# Patient Record
Sex: Male | Born: 1970 | Race: White | Hispanic: No | Marital: Single | Smoking: Former smoker
Health system: Southern US, Community
[De-identification: ages and names within clinical notes are randomized; demographics above are authoritative.]

## PROBLEM LIST (undated history)

## (undated) DIAGNOSIS — A4902 Methicillin resistant Staphylococcus aureus infection, unspecified site: Secondary | ICD-10-CM

## (undated) DIAGNOSIS — M479 Spondylosis, unspecified: Secondary | ICD-10-CM

## (undated) DIAGNOSIS — R945 Abnormal results of liver function studies: Secondary | ICD-10-CM

## (undated) DIAGNOSIS — T148XXA Other injury of unspecified body region, initial encounter: Secondary | ICD-10-CM

## (undated) DIAGNOSIS — M549 Dorsalgia, unspecified: Secondary | ICD-10-CM

## (undated) DIAGNOSIS — I10 Essential (primary) hypertension: Secondary | ICD-10-CM

## (undated) DIAGNOSIS — S2239XA Fracture of one rib, unspecified side, initial encounter for closed fracture: Secondary | ICD-10-CM

## (undated) DIAGNOSIS — R0600 Dyspnea, unspecified: Secondary | ICD-10-CM

## (undated) HISTORY — PX: WRIST SURGERY: SHX841

## (undated) HISTORY — DX: Essential (primary) hypertension: I10

## (undated) HISTORY — PX: OTHER SURGICAL HISTORY: SHX169

## (undated) HISTORY — PX: HEMORROIDECTOMY: SUR656

## (undated) HISTORY — PX: KNEE ARTHROSCOPY: SUR90

---

## 2000-10-21 ENCOUNTER — Emergency Department (HOSPITAL_COMMUNITY): Admission: EM | Admit: 2000-10-21 | Discharge: 2000-10-21 | Payer: Self-pay | Admitting: Emergency Medicine

## 2001-01-01 ENCOUNTER — Inpatient Hospital Stay (HOSPITAL_COMMUNITY): Admission: EM | Admit: 2001-01-01 | Discharge: 2001-01-02 | Payer: Self-pay | Admitting: General Surgery

## 2001-01-01 ENCOUNTER — Encounter: Payer: Self-pay | Admitting: General Surgery

## 2003-01-25 ENCOUNTER — Ambulatory Visit (HOSPITAL_COMMUNITY): Admission: RE | Admit: 2003-01-25 | Discharge: 2003-01-25 | Payer: Self-pay | Admitting: Internal Medicine

## 2003-05-14 ENCOUNTER — Emergency Department (HOSPITAL_COMMUNITY): Admission: EM | Admit: 2003-05-14 | Discharge: 2003-05-14 | Payer: Self-pay | Admitting: Emergency Medicine

## 2003-05-17 ENCOUNTER — Inpatient Hospital Stay (HOSPITAL_COMMUNITY): Admission: EM | Admit: 2003-05-17 | Discharge: 2003-05-22 | Payer: Self-pay | Admitting: Emergency Medicine

## 2003-05-29 ENCOUNTER — Emergency Department (HOSPITAL_COMMUNITY): Admission: EM | Admit: 2003-05-29 | Discharge: 2003-05-29 | Payer: Self-pay | Admitting: Emergency Medicine

## 2003-06-14 ENCOUNTER — Inpatient Hospital Stay (HOSPITAL_COMMUNITY): Admission: EM | Admit: 2003-06-14 | Discharge: 2003-06-16 | Payer: Self-pay | Admitting: *Deleted

## 2003-12-26 ENCOUNTER — Emergency Department (HOSPITAL_COMMUNITY): Admission: EM | Admit: 2003-12-26 | Discharge: 2003-12-27 | Payer: Self-pay | Admitting: Emergency Medicine

## 2003-12-28 ENCOUNTER — Emergency Department (HOSPITAL_COMMUNITY): Admission: EM | Admit: 2003-12-28 | Discharge: 2003-12-29 | Payer: Self-pay | Admitting: *Deleted

## 2003-12-31 ENCOUNTER — Emergency Department (HOSPITAL_COMMUNITY): Admission: EM | Admit: 2003-12-31 | Discharge: 2003-12-31 | Payer: Self-pay | Admitting: Emergency Medicine

## 2004-01-02 ENCOUNTER — Emergency Department (HOSPITAL_COMMUNITY): Admission: EM | Admit: 2004-01-02 | Discharge: 2004-01-02 | Payer: Self-pay | Admitting: *Deleted

## 2004-04-06 ENCOUNTER — Emergency Department (HOSPITAL_COMMUNITY): Admission: EM | Admit: 2004-04-06 | Discharge: 2004-04-06 | Payer: Self-pay | Admitting: Emergency Medicine

## 2004-08-10 ENCOUNTER — Emergency Department (HOSPITAL_COMMUNITY): Admission: EM | Admit: 2004-08-10 | Discharge: 2004-08-11 | Payer: Self-pay | Admitting: *Deleted

## 2004-08-12 ENCOUNTER — Emergency Department (HOSPITAL_COMMUNITY): Admission: EM | Admit: 2004-08-12 | Discharge: 2004-08-12 | Payer: Self-pay | Admitting: Emergency Medicine

## 2004-08-14 ENCOUNTER — Emergency Department (HOSPITAL_COMMUNITY): Admission: EM | Admit: 2004-08-14 | Discharge: 2004-08-14 | Payer: Self-pay | Admitting: Emergency Medicine

## 2004-09-26 ENCOUNTER — Emergency Department (HOSPITAL_COMMUNITY): Admission: EM | Admit: 2004-09-26 | Discharge: 2004-09-26 | Payer: Self-pay | Admitting: Emergency Medicine

## 2004-09-28 ENCOUNTER — Emergency Department (HOSPITAL_COMMUNITY): Admission: EM | Admit: 2004-09-28 | Discharge: 2004-09-28 | Payer: Self-pay | Admitting: *Deleted

## 2004-09-30 ENCOUNTER — Emergency Department (HOSPITAL_COMMUNITY): Admission: EM | Admit: 2004-09-30 | Discharge: 2004-09-30 | Payer: Self-pay | Admitting: Emergency Medicine

## 2005-06-06 ENCOUNTER — Emergency Department (HOSPITAL_COMMUNITY): Admission: EM | Admit: 2005-06-06 | Discharge: 2005-06-07 | Payer: Self-pay | Admitting: Emergency Medicine

## 2005-06-08 ENCOUNTER — Emergency Department (HOSPITAL_COMMUNITY): Admission: EM | Admit: 2005-06-08 | Discharge: 2005-06-09 | Payer: Self-pay | Admitting: Emergency Medicine

## 2005-08-05 ENCOUNTER — Emergency Department (HOSPITAL_COMMUNITY): Admission: EM | Admit: 2005-08-05 | Discharge: 2005-08-05 | Payer: Self-pay | Admitting: Emergency Medicine

## 2005-08-07 ENCOUNTER — Emergency Department (HOSPITAL_COMMUNITY): Admission: EM | Admit: 2005-08-07 | Discharge: 2005-08-07 | Payer: Self-pay | Admitting: Emergency Medicine

## 2007-06-24 ENCOUNTER — Emergency Department (HOSPITAL_COMMUNITY): Admission: EM | Admit: 2007-06-24 | Discharge: 2007-06-24 | Payer: Self-pay | Admitting: Emergency Medicine

## 2009-08-08 ENCOUNTER — Emergency Department (HOSPITAL_COMMUNITY): Admission: EM | Admit: 2009-08-08 | Discharge: 2009-08-08 | Payer: Self-pay | Admitting: Emergency Medicine

## 2009-08-12 ENCOUNTER — Emergency Department (HOSPITAL_COMMUNITY): Admission: EM | Admit: 2009-08-12 | Discharge: 2009-08-12 | Payer: Self-pay | Admitting: Emergency Medicine

## 2009-10-16 ENCOUNTER — Emergency Department (HOSPITAL_COMMUNITY): Admission: EM | Admit: 2009-10-16 | Discharge: 2009-10-16 | Payer: Self-pay | Admitting: Emergency Medicine

## 2009-10-28 ENCOUNTER — Emergency Department (HOSPITAL_COMMUNITY): Admission: EM | Admit: 2009-10-28 | Discharge: 2009-10-28 | Payer: Self-pay | Admitting: Emergency Medicine

## 2009-11-05 ENCOUNTER — Emergency Department (HOSPITAL_COMMUNITY): Admission: EM | Admit: 2009-11-05 | Discharge: 2009-11-05 | Payer: Self-pay | Admitting: Emergency Medicine

## 2009-11-17 ENCOUNTER — Ambulatory Visit (HOSPITAL_COMMUNITY): Admission: RE | Admit: 2009-11-17 | Discharge: 2009-11-17 | Payer: Self-pay | Admitting: Orthopedic Surgery

## 2010-02-07 ENCOUNTER — Emergency Department (HOSPITAL_COMMUNITY)
Admission: EM | Admit: 2010-02-07 | Discharge: 2010-02-07 | Payer: Self-pay | Source: Home / Self Care | Admitting: Emergency Medicine

## 2010-02-12 ENCOUNTER — Emergency Department (HOSPITAL_COMMUNITY)
Admission: EM | Admit: 2010-02-12 | Discharge: 2010-02-12 | Payer: Self-pay | Source: Home / Self Care | Admitting: Emergency Medicine

## 2010-05-18 LAB — CBC
HCT: 44.9 % (ref 39.0–52.0)
Hemoglobin: 15.1 g/dL (ref 13.0–17.0)
MCHC: 33.6 g/dL (ref 30.0–36.0)
RBC: 5.01 MIL/uL (ref 4.22–5.81)
WBC: 10.6 10*3/uL — ABNORMAL HIGH (ref 4.0–10.5)

## 2010-05-18 LAB — BASIC METABOLIC PANEL
BUN: 11 mg/dL (ref 6–23)
Glucose, Bld: 100 mg/dL — ABNORMAL HIGH (ref 70–99)
Potassium: 4.6 mEq/L (ref 3.5–5.1)
Sodium: 138 mEq/L (ref 135–145)

## 2010-05-27 ENCOUNTER — Emergency Department (HOSPITAL_COMMUNITY)
Admission: EM | Admit: 2010-05-27 | Discharge: 2010-05-27 | Disposition: A | Discharge status: Home / Self Care | Payer: Self-pay | Attending: Emergency Medicine | Admitting: Emergency Medicine

## 2010-05-27 ENCOUNTER — Emergency Department (HOSPITAL_COMMUNITY): Payer: Self-pay

## 2010-05-27 DIAGNOSIS — R209 Unspecified disturbances of skin sensation: Secondary | ICD-10-CM | POA: Insufficient documentation

## 2010-05-27 DIAGNOSIS — R5381 Other malaise: Secondary | ICD-10-CM | POA: Insufficient documentation

## 2010-05-27 DIAGNOSIS — R5383 Other fatigue: Secondary | ICD-10-CM | POA: Insufficient documentation

## 2010-05-27 DIAGNOSIS — R42 Dizziness and giddiness: Secondary | ICD-10-CM | POA: Insufficient documentation

## 2010-05-27 DIAGNOSIS — R079 Chest pain, unspecified: Secondary | ICD-10-CM | POA: Insufficient documentation

## 2010-05-27 DIAGNOSIS — I10 Essential (primary) hypertension: Secondary | ICD-10-CM | POA: Insufficient documentation

## 2010-05-27 DIAGNOSIS — E876 Hypokalemia: Secondary | ICD-10-CM | POA: Insufficient documentation

## 2010-05-27 LAB — BASIC METABOLIC PANEL
CO2: 28 mEq/L (ref 19–32)
Glucose, Bld: 105 mg/dL — ABNORMAL HIGH (ref 70–99)
Potassium: 3.1 mEq/L — ABNORMAL LOW (ref 3.5–5.1)
Sodium: 141 mEq/L (ref 135–145)

## 2010-05-27 LAB — POCT CARDIAC MARKERS: Troponin i, poc: <0.05 ng/mL (ref 0.00–0.09)

## 2010-06-01 ENCOUNTER — Emergency Department (HOSPITAL_COMMUNITY)
Admission: EM | Admit: 2010-06-01 | Discharge: 2010-06-01 | Disposition: A | Discharge status: Home / Self Care | Payer: Self-pay | Attending: Emergency Medicine | Admitting: Emergency Medicine

## 2010-06-01 DIAGNOSIS — G5600 Carpal tunnel syndrome, unspecified upper limb: Secondary | ICD-10-CM | POA: Insufficient documentation

## 2010-06-01 DIAGNOSIS — R209 Unspecified disturbances of skin sensation: Secondary | ICD-10-CM | POA: Insufficient documentation

## 2010-06-25 ENCOUNTER — Emergency Department (HOSPITAL_COMMUNITY)
Admission: EM | Admit: 2010-06-25 | Discharge: 2010-06-25 | Disposition: A | Discharge status: Home / Self Care | Payer: Self-pay | Attending: Emergency Medicine | Admitting: Emergency Medicine

## 2010-06-25 ENCOUNTER — Emergency Department (HOSPITAL_COMMUNITY): Payer: Self-pay

## 2010-06-25 DIAGNOSIS — I10 Essential (primary) hypertension: Secondary | ICD-10-CM | POA: Insufficient documentation

## 2010-06-25 DIAGNOSIS — R071 Chest pain on breathing: Secondary | ICD-10-CM | POA: Insufficient documentation

## 2010-06-25 DIAGNOSIS — Z79899 Other long term (current) drug therapy: Secondary | ICD-10-CM | POA: Insufficient documentation

## 2010-06-25 DIAGNOSIS — E876 Hypokalemia: Secondary | ICD-10-CM | POA: Insufficient documentation

## 2010-06-25 LAB — DIFFERENTIAL
Basophils Absolute: 0 10*3/uL (ref 0.0–0.1)
Basophils Relative: 0 % (ref 0–1)
Eosinophils Absolute: 0.1 10*3/uL (ref 0.0–0.7)
Eosinophils Relative: 1 % (ref 0–5)
Lymphs Abs: 3.4 10*3/uL (ref 0.7–4.0)
Monocytes Absolute: 1.1 10*3/uL — ABNORMAL HIGH (ref 0.1–1.0)
Monocytes Relative: 8 % (ref 3–12)

## 2010-06-25 LAB — CBC
Hemoglobin: 13.4 g/dL (ref 13.0–17.0)
MCH: 30.1 pg (ref 26.0–34.0)
MCHC: 32.6 g/dL (ref 30.0–36.0)
MCV: 92.4 fL (ref 78.0–100.0)
Platelets: 273 10*3/uL (ref 150–400)

## 2010-06-25 LAB — BASIC METABOLIC PANEL
Creatinine, Ser: 1.33 mg/dL (ref 0.4–1.5)
GFR calc non Af Amer: 59 mL/min — ABNORMAL LOW (ref >60)
Potassium: 3 mEq/L — ABNORMAL LOW (ref 3.5–5.1)
Sodium: 139 mEq/L (ref 135–145)

## 2010-06-25 LAB — POCT CARDIAC MARKERS: Troponin i, poc: <0.05 ng/mL (ref 0.00–0.09)

## 2010-07-19 ENCOUNTER — Ambulatory Visit: Payer: Self-pay | Admitting: Internal Medicine
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
## 2010-07-21 NOTE — Discharge Summary (Signed)
NAME:  Benjamin Vaughn, Benjamin Vaughn                         ACCOUNT NO.:  [id number]   MEDICAL RECORD NO.:  [id number]                   PATIENT TYPE:  INP   LOCATION:  A318                                 FACILITY:  [facility]   PHYSICIAN:  Dalia Heading, M.D.               DATE OF BIRTH:  1970/03/10   DATE OF ADMISSION:  05/17/2003  DATE OF DISCHARGE:  05/22/2003                                 DISCHARGE SUMMARY   HOSPITAL COURSE SUMMARY:  The patient is a 40 year old white male who  presented with both a gangrenous external hemorrhoid, as well as a  cellulitis of the right lower extremity due to a boil.  He was admitted  through the emergency room for intravenous antibiotic therapy as he had  failed outpatient oral antibiotic therapy.  A culture of the right leg  abscess revealed MRSA.  He was taken to the operating room on May 21, 2003  and underwent debridement of the right leg wound as well as a  hemorrhoidectomy.  He tolerated the procedure well.  His postoperative  course has been unremarkable.   The patient is being discharged home on May 22, 2003 in fair and stable  condition.   DISCHARGE INSTRUCTIONS:  The patient is to follow up with Dr. Franky Macho  on May 27, 2003.   DISCHARGE MEDICATIONS:  1. Doxycycline 100 mg p.o. b.i.d. x10 days.  2. Vicodin 1-2 tablets p.o. q.4h. p.r.n. pain.  3. Metronidazole as previously prescribed.  4. He is to clean his right leg wound twice a day with peroxide.  He may     soak in a tub on May 23, 2003.   PRINCIPAL DIAGNOSIS:  1. Methicillin-resistant Staphylococcus aureus infection right lower     extremity.  2. Gangrenous external hemorrhoid.   PRINCIPAL PROCEDURE:  Debridement of right leg wound, simple  hemorrhoidectomy on May 21, 2003.     ___________________________________________                                         Dalia Heading, M.D.   MAJ/MEDQ  D:  05/22/2003  T:  05/23/2003  Job:  [id number]

## 2010-07-21 NOTE — H&P (Signed)
NAME:  Benjamin Vaughn, Benjamin Vaughn                         ACCOUNT NO.:  [id number]   MEDICAL RECORD NO.:  [id number]                   PATIENT TYPE:  INP   LOCATION:  A304                                 FACILITY:  [facility]   PHYSICIAN:  Patrica Duel, M.D.                 DATE OF BIRTH:  June 11, 1970   DATE OF ADMISSION:  06/13/2003  DATE OF DISCHARGE:                                HISTORY & PHYSICAL   CHIEF COMPLAINT:  Cough, shortness of breath and wheezing.   HISTORY OF PRESENT ILLNESS:  This is a 40 year old male.  The patient was  discharged from the hospital approximately three weeks ago after having  suffered perirectal abscess as well as a methicillin-resistant  Staphylococcus aureus wound infection of the right lower extremity.  Since  discharge, the patient has done quite well in regards to both problems.  He  has had no recurrent rectal pain and his leg has healed nicely with  antibiotic therapy.   The patient presented to the emergency department the evening of admission  with a two-week history of increasingly severe cough, wheezing, shortness of  breath and anterior chest wall pain.  Minimal workup was obtained.  He  failed to respond to nebulizer treatments and is admitted for refractory  asthmatic bronchitis.   NEUROLOGIC:  No history of headaches, neurologic deficits.  CARDIOPULMONARY:  No symptoms with acute coronary syndrome or angina.  His cough is minimally  productive.  He has had no rigor or significant shortness of breath.  GASTROINTESTINAL:  No abdominal pain, nausea, vomiting, diarrhea, melena,  hematemesis, hematochezia.  GENITOURINARY:  Negative.   CURRENT MEDICATIONS:  Dyazide 37.5/25 only.   PAST MEDICAL HISTORY:  Hypertension.   PAST SURGICAL HISTORY:  As noted.   FAMILY HISTORY:  Noncontributory.   SOCIAL HISTORY:  Nonsmoker.  Nondrinker.   PHYSICAL EXAMINATION:  GENERAL:  This is a very pleasant, fully alert male  who is in no acute distress.  VITAL  SIGNS:  Temperature 97.9, blood pressure 168/113, pulse 115,  respirations 20 and unlabored.  HEENT:  Normocephalic, atraumatic, pupils equal round and reactive to light.  Ears, nose and throat benign.  NECK:  Supple.  No bruits, thyromegaly or lymphadenopathy.  LUNGS:  Scattered expiratory wheezes with scattered rales as well.  There  are no focal adventitious sounds.  HEART:  Sounds perfectly normal.  No murmurs, rubs or gallops.  ABDOMEN:  Nontender, nondistended.  Bowel sounds intact.  EXTREMITIES:  No clubbing, cyanosis or edema.  NEUROLOGIC:  Fully intact.   LABORATORY DATA AND X-RAY FINDINGS:  Chest x-ray is currently pending.   ASSESSMENT:  1. Asthmatic bronchitis.  2. Chest wall pain.  3. Hypertension.  4. Recent episode of perirectal abscess as well as methicillin-resistant     Staphylococcus aureus which could be a factor in this current situation.   PLAN:  1. Broad spectrum antibiotics.  2. Pulmonary toilet.  3. Empiric steroids.  4. Will follow and treat expectantly.     ___________________________________________                                         Patrica Duel, M.D.   MC/MEDQ  D:  06/14/2003  T:  06/14/2003  Job:  [id number]

## 2010-07-21 NOTE — H&P (Signed)
NAME:  Benjamin Vaughn, Benjamin Vaughn                         ACCOUNT NO.:  [id number]   MEDICAL RECORD NO.:  [id number]                   PATIENT TYPE:  INP   LOCATION:  A318                                 FACILITY:  [facility]   PHYSICIAN:  Dalia Heading, M.D.               DATE OF BIRTH:  1970-04-09   DATE OF ADMISSION:  05/17/2003  DATE OF DISCHARGE:                                HISTORY & PHYSICAL   REASON FOR ADMISSION:  Worsening cellulitis, right leg.   HISTORY OF PRESENT ILLNESS:  The patient is a 40 year old white male who was  status post I&D of a perirectal abscess, with gangrenous external  hemorrhoids, on May 14, 2003, who now presents to the emergency room with  a worsening cellulitis around a boil lateral and superior to the right knee.  He has been taking Keflex and metronidazole as prescribed.  He has developed  significant pain and swelling in this region.  It is limiting his motion.  No drainage has been noted from the boil of the right knee.  His perirectal  region has been healing and draining well.   PAST MEDICAL HISTORY:  Unremarkable.   PAST SURGICAL HISTORY:  Unremarkable.   CURRENT MEDICATIONS:  1. Vicodin.  2. Flagyl.  3. Keflex.   ALLERGIES:  No known drug allergies.   REVIEW OF SYSTEMS:  Noncontributory.   PHYSICAL EXAMINATION:  GENERAL:  The patient is a well-developed, well-  nourished white male, in no acute distress.  VITAL SIGNS:  Temperature 99.5, vital signs are stable.  LUNGS:  Clear to auscultation with equal breath sounds bilaterally.  HEART:  Examination reveals a regular rate and rhythm without S3, S4 or  murmurs.  RECTAL:  Examination reveals a healing incision site at the 12 o'clock  position.  Minimal drains as noted.  EXTREMITIES:  Examination reveals erythema, pain and induration around a 2  cm boil superior and lateral to the right knee.  This was lanced in the  emergency room and necrotic debris was expressed.   LABORATORY DATA:  White  blood cell count 13.4, hematocrit 42, platelet count  346.  Potassium is low at 3.3, MET 7 is otherwise within normal limits.  White blood cell is decreased from 14.4 last week.   IMPRESSION:  Cellulitis, right leg, failure of outpatient antibiotic  therapy.   PLAN:  The patient is being admitted to he hospital for intravenous  antibiotic therapy and care of his cellulitis of the right leg.  Zosyn  3/0.375 mg IV q.6h. has been ordered.  A followup white blood cell count  will be performed in the morning.     ___________________________________________                                         Dalia Heading, M.D.  MAJ/MEDQ  D:  05/17/2003  T:  05/17/2003  Job:  [id number]   cc:   Madelin Rear. Sherwood Gambler, M.D.  [address]  [place]  [place] [address]  Fax: [phone]

## 2010-07-21 NOTE — Procedures (Signed)
NAME:  Benjamin Vaughn, Benjamin Vaughn                         ACCOUNT NO.:  [id number]   MEDICAL RECORD NO.:  [id number]                   PATIENT TYPE:  INP   LOCATION:  A304                                 FACILITY:  [facility]   PHYSICIAN:  Deltana Bing, M.D.               DATE OF BIRTH:  04/19/1970   DATE OF PROCEDURE:  DATE OF DISCHARGE:                                  ECHOCARDIOGRAM   CLINICAL DATA:  A 40 year old gentleman with hypertension, chest pain and  dyspnea.   MEASUREMENTS:  1. Aorta is 3.4.  2. Left atrium 4.1.  3. Septum 1.3.  4. Posterior wall 1.2.  5. LV diastole 4.1.  6. LV systole 2.8.   FINDINGS:  1. Technically-adequate echocardiographic study.  2. Normal left atrium, right atrium and right ventricle.  3. Normal aorta, mitral, tricuspid and pulmonic valve.  4. Mild mitral annular calcification, trivial mitral regurgitation.  5. Normal internal dimension of the ventricle; very mild concentric left     ventricular hypertrophy.  6. Normal regional and global LV systolic function.  7. Normal IVC.      ___________________________________________                                             Bing, M.D.   RR/MEDQ  D:  06/15/2003  T:  06/15/2003  Job:  [id number]   cc:   Patrica Duel, M.D.  [address], [address]  [place]  [place] [address]  Fax: [phone]

## 2010-07-21 NOTE — Consult Note (Signed)
NAME:  Benjamin Vaughn, Benjamin Vaughn                         ACCOUNT NO.:  [id number]   MEDICAL RECORD NO.:  [id number]                   PATIENT TYPE:  EMS   LOCATION:  ED                                   FACILITY:  [facility]   PHYSICIAN:  Dalia Heading, M.D.               DATE OF BIRTH:  [birth year]   DATE OF CONSULTATION:  DATE OF DISCHARGE:                                   CONSULTATION   REASON FOR CONSULTATION:  Gangrenous external hemorrhoid.   HISTORY OF PRESENT ILLNESS:  The patient is a 40 year old white male who two  days ago started having perirectal pain.  He presented to Dr. Sharyon Medicus office  and was found to have a necrotic external hemorrhoid.  He was brought to the  emergency room for further evaluation and treatment.  He denies any history  fo hemorrhoidal disease.   PAST MEDICAL HISTORY:  Unremarkable.   PAST SURGICAL HISTORY:  Unremarkable.   MEDICATIONS:  None.   ALLERGIES:  No known drug allergies.   REVIEW OF SYSTEMS:  Noncontributory.   PHYSICAL EXAMINATION:  GENERAL APPEARANCE:  The patient is a 40 year old  white male who is lying on the cart in no acute distress, though, he is  uncomfortable.  VITAL SIGNS:  He is afebrile and vital signs are stable.  RECTAL:  A large necrotic hemorrhoid at the 12 o'clock position.  Mild  erythema is extending up to the base of the scrotum.  Examination was  limited secondary to pain.   PROCEDURE NOTE:  Informed consent was obtained.  The hemorrhoid and peroneum  were prepped with Betadine.  Aerofreeze spray was used for local anesthesia.  Incision was made into the external hemorrhoid.  A small amount of purulent  fluid as well as necrotic debris were evacuated.  No excessive bleeding was  noted at the end of the procedure.  He tolerated the procedure well.   White blood cell was noted to be elevated at 14,000.  Met-7 was within  normal limits.   IMPRESSION:  Necrotic, thrombosed external hemorrhoid.   PLAN:  The patient was  offered admission, but he would prefer to go home.  He should soak in a warm tub twice a day.   DISCHARGE MEDICATIONS:  1. Ciprofloxacin 500 mg p.o. b.i.d. x1 week.  2. Flagyl 500 mg p.o. b.i.d. x1 week.  3. Lortab 10/650 dispensed 50 1 tablet p.o. q.4h. p.r.n. pain.   FOLLOWUP:  He is to follow up in my office on May 18, 2003.      ___________________________________________                                            Dalia Heading, M.D.   MAJ/MEDQ  D:  05/14/2003  T:  05/14/2003  Job:  [id number]  cc:   Madelin Rear. Sherwood Gambler, M.D.  [address]  [place]  [place] [address]  Fax: [phone]

## 2010-07-21 NOTE — Op Note (Signed)
NAME:  Benjamin Vaughn, Benjamin Vaughn NO.:  [id number]   MEDICAL RECORD NO.:  [id number]                  PATIENT TYPE:   LOCATION:                                       FACILITY:   PHYSICIAN:  Dalia Heading, M.D.               DATE OF BIRTH:   DATE OF PROCEDURE:  05/21/2003  DATE OF DISCHARGE:                                 OPERATIVE REPORT   PREOPERATIVE DIAGNOSES:  1. Gangrenous external hemorrhoid.  2. Abscess right leg.   POSTOPERATIVE DIAGNOSES:  1. Gangrenous external hemorrhoid.  2. Abscess right leg.   PROCEDURE:  1. Simple hemorrhoidectomy.  2. Debridement of right leg abscess.   SURGEON:  Dalia Heading, M.D.   ANESTHESIA:  General endotracheal.   INDICATIONS:  The patient is a 40 year old white male who presents with both  a gangrenous external hemorrhoid at the 12 o'clock position as well as a  draining abscess in the right leg resulting in cellulitis.  He has been on  Zosyn and Cleocin for antibiotic wound coverage.  He now comes to the  operating room for debridement of the right leg as well as an external  hemorrhoidectomy.  The risks and benefits of the procedures of both  procedures were fully explained to the patient, who gave informed consent.   PROCEDURE NOTE:  The patient was placed in the supine position.  After  induction of general anesthesia, the right lower extremity was prepped and  draped using the usual sterile technique with Betadine.  Surgical site  confirmation was performed.   A previous draining abscess cavity was incised further.  The abscess cavity  measured approximately 3 cm in its greatest diameter.  The soft tissue was  debrided to healthy tissue without difficulty.  The wound was irrigated with  normal saline.  It was then packed with iodoform gauze.  A dry sterile  dressing was then applied.   Next, the patient was placed in the lithotomy position.  The perineum was  prepped and draped using the usual  sterile technique with Betadine.  A  necrotic thrombosed external hemorrhoid was noted at the 12 o'clock position  with induration extending to the base of the scrotum.  Excess tissue was  excised and the soft tissue was debrided using a curette. No significant  purulent drainage was noted.  The area where the hemorrhoid was removed was  closed using 4-0 Vicryl interrupted sutures.  A small wick of iodoform gauze  was placed through the upper portion of the wound into the soft tissue.  Sensorcaine 0.5% was instilled into the surrounding region.   All tape and needle counts were correct at the end of the procedure.  The  patient was extubated in the operating room and went back to recovery room  awake, in stable condition.   COMPLICATIONS:  None.   SPECIMENS:  None.   BLOOD LOSS:  Minimal.  ___________________________________________                                            Dalia Heading, M.D.   MAJ/MEDQ  D:  05/21/2003  T:  05/21/2003  Job:  [id number]   cc:   Dalia Heading, M.D.  [address], [place]  [place] [address]  Fax: [phone]   Madelin Rear. Sherwood Gambler, M.D.  [address]  [place]  [place] [address]  Fax: [phone]

## 2010-07-21 NOTE — Discharge Summary (Signed)
NAME:  [name], Benjamin Vaughn                         ACCOUNT NO.:  [id number]   MEDICAL RECORD NO.:  [id number]                   PATIENT TYPE:  INP   LOCATION:  A304                                 FACILITY:  [facility]   PHYSICIAN:  Patrica Duel, M.D.                 DATE OF BIRTH:  1970-08-27   DATE OF ADMISSION:  06/14/2003  DATE OF DISCHARGE:  06/16/2003                                 DISCHARGE SUMMARY   DISCHARGE DIAGNOSES:  1. Asthmatic bronchitis.  2. Hypertension, controlled.  3. Steroid induced hyperglycemia, resolving.  4. Chest wall pain; EKG enzymes stable.  Echocardiogram currently pending.   HISTORY:  For details regarding admission, please refer to admitting note.  Briefly this 40 year old male with the history above had been discharged  from the hospital approximately 3 weeks prior to this admission after  suffered a perirectal abscess as well as a methicillin-resistant  Staphylococcus aureus wound infection, right lower extremity.  The patient  had done very well in regards to both problems.  He had had no rectal pain  and his leg had healed completely with antibiotic therapy.   The patient presented to the emergency department the evening of admission  with a 2-week history of increasingly severe cough, wheezing, shortness of  breath, and anterior chest wall pain.  Minimal workup was obtained.  He was  treated routinely, but did not respond appropriately and was admitted for  refractory asthmatic bronchitis.   COURSE IN THE HOSPITAL:  The patient was treated empirically with broad-  spectrum antibiotics, steroids, and pulmonary toilet.  He has improved.  His  EKG is stable.  Cardiac enzymes negative.  His pain, at this time, is  clearly chest wall related to coughing and probably viral syndrome.  Today  the patient is eating vigorously and requesting discharge.  His wheezing is  practically resolved completely.  He feels much better and is stable for  discharge.  Of note,  he had been treated chronically with Dyazide which was  discontinued and lisinopril 10 mg was begun.  He has responded very nicely  with normalization of his blood pressure.   DISPOSITION:  1. Zithromax Z-Pak.  2. Codiclear DH.  3. Albuterol inhaler p.r.n.  4. Prednisone 5 mg taper.  5. Lisinopril 10 mg daily.  6. Tylox #60 p.r.n. pain.   DISCHARGE INSTRUCTIONS:  To be followed and treated expectantly as an  outpatient.    ___________________________________________                                         Patrica Duel, M.D.   MC/MEDQ  D:  06/16/2003  T:  06/16/2003  Job:  [id number]

## 2010-07-24 ENCOUNTER — Ambulatory Visit (INDEPENDENT_AMBULATORY_CARE_PROVIDER_SITE_OTHER): Payer: Self-pay | Admitting: Internal Medicine

## 2010-07-24 ENCOUNTER — Encounter: Payer: Self-pay | Admitting: Internal Medicine

## 2010-07-24 ENCOUNTER — Other Ambulatory Visit (INDEPENDENT_AMBULATORY_CARE_PROVIDER_SITE_OTHER): Payer: Self-pay

## 2010-07-24 ENCOUNTER — Ambulatory Visit (INDEPENDENT_AMBULATORY_CARE_PROVIDER_SITE_OTHER)
Admission: RE | Admit: 2010-07-24 | Discharge: 2010-07-24 | Disposition: A | Discharge status: Home / Self Care | Payer: Self-pay | Source: Ambulatory Visit | Attending: Internal Medicine | Admitting: Internal Medicine

## 2010-07-24 VITALS — BP 142/90 | HR 84 | Temp 98.2°F | Resp 16 | Ht 71.0 in | Wt 145.8 lb

## 2010-07-24 DIAGNOSIS — R200 Anesthesia of skin: Secondary | ICD-10-CM

## 2010-07-24 DIAGNOSIS — G8929 Other chronic pain: Secondary | ICD-10-CM

## 2010-07-24 DIAGNOSIS — D72829 Elevated white blood cell count, unspecified: Secondary | ICD-10-CM

## 2010-07-24 DIAGNOSIS — I10 Essential (primary) hypertension: Secondary | ICD-10-CM | POA: Insufficient documentation

## 2010-07-24 DIAGNOSIS — M542 Cervicalgia: Secondary | ICD-10-CM

## 2010-07-24 DIAGNOSIS — M5412 Radiculopathy, cervical region: Secondary | ICD-10-CM

## 2010-07-24 DIAGNOSIS — R209 Unspecified disturbances of skin sensation: Secondary | ICD-10-CM

## 2010-07-24 LAB — BASIC METABOLIC PANEL
CO2: 30 mEq/L (ref 19–32)
Calcium: 9.6 mg/dL (ref 8.4–10.5)
Sodium: 135 mEq/L (ref 135–145)

## 2010-07-24 LAB — CBC WITH DIFFERENTIAL/PLATELET
Basophils Absolute: 0.1 10*3/uL (ref 0.0–0.1)
Eosinophils Absolute: 0.1 10*3/uL (ref 0.0–0.7)
HCT: 42.3 % (ref 39.0–52.0)
Lymphocytes Relative: 15.1 % (ref 12.0–46.0)
Lymphs Abs: 1.3 10*3/uL (ref 0.7–4.0)
Monocytes Relative: 9.8 % (ref 3.0–12.0)
Platelets: 318 10*3/uL (ref 150.0–400.0)
RDW: 13.4 % (ref 11.5–14.6)

## 2010-07-24 MED ORDER — LISINOPRIL-HYDROCHLOROTHIAZIDE 20-12.5 MG PO TABS: 1.0000 | ORAL_TABLET | Freq: Every day | ORAL | Status: DC

## 2010-07-24 MED ORDER — CYCLOBENZAPRINE HCL 10 MG PO TABS: 10.0000 mg | ORAL_TABLET | Freq: Three times a day (TID) | ORAL | Status: DC | PRN

## 2010-07-24 NOTE — Patient Instructions (Signed)
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
## 2010-07-25 ENCOUNTER — Encounter: Payer: Self-pay | Admitting: Internal Medicine

## 2010-07-25 DIAGNOSIS — R200 Anesthesia of skin: Secondary | ICD-10-CM | POA: Insufficient documentation

## 2010-07-25 DIAGNOSIS — M5412 Radiculopathy, cervical region: Secondary | ICD-10-CM | POA: Insufficient documentation

## 2010-07-25 NOTE — Assessment & Plan Note (Signed)
His plain films show some significant disease so I will order an MRI to look for spinal stenosis, narrowed foramen, tumor, etc.

## 2010-07-25 NOTE — Assessment & Plan Note (Signed)
His BP is well controlled 

## 2010-07-25 NOTE — Assessment & Plan Note (Signed)
Plain films today to look for disease

## 2010-07-25 NOTE — Assessment & Plan Note (Signed)
He was sent to me at the advice of Dr. Izora Ribas (hand surgeon) who has requested that a NCS be done.

## 2010-07-25 NOTE — Assessment & Plan Note (Signed)
Repeat CBC today shows that this has resolved

## 2010-08-25 ENCOUNTER — Emergency Department (HOSPITAL_COMMUNITY): Payer: Self-pay

## 2010-08-25 ENCOUNTER — Emergency Department (HOSPITAL_COMMUNITY)
Admission: EM | Admit: 2010-08-25 | Discharge: 2010-08-25 | Disposition: A | Discharge status: Home / Self Care | Payer: Self-pay | Attending: Emergency Medicine | Admitting: Emergency Medicine

## 2010-08-25 DIAGNOSIS — M542 Cervicalgia: Secondary | ICD-10-CM | POA: Insufficient documentation

## 2010-08-25 DIAGNOSIS — M79609 Pain in unspecified limb: Secondary | ICD-10-CM | POA: Insufficient documentation

## 2010-08-25 DIAGNOSIS — X58XXXA Exposure to other specified factors, initial encounter: Secondary | ICD-10-CM | POA: Insufficient documentation

## 2010-08-25 DIAGNOSIS — G8929 Other chronic pain: Secondary | ICD-10-CM | POA: Insufficient documentation

## 2010-08-25 DIAGNOSIS — I10 Essential (primary) hypertension: Secondary | ICD-10-CM | POA: Insufficient documentation

## 2010-08-25 DIAGNOSIS — S60229A Contusion of unspecified hand, initial encounter: Secondary | ICD-10-CM | POA: Insufficient documentation

## 2010-08-25 DIAGNOSIS — M549 Dorsalgia, unspecified: Secondary | ICD-10-CM | POA: Insufficient documentation

## 2010-10-04 ENCOUNTER — Telehealth: Payer: Self-pay | Admitting: *Deleted

## 2010-10-04 DIAGNOSIS — R200 Anesthesia of skin: Secondary | ICD-10-CM

## 2010-10-04 DIAGNOSIS — G8929 Other chronic pain: Secondary | ICD-10-CM

## 2010-10-04 NOTE — Telephone Encounter (Signed)
Pt left vm req status of referral to neuro from May OV. Referral was put in as an order not referral - placed correct order and PCC's aware.

## 2010-10-18 ENCOUNTER — Encounter: Payer: Self-pay | Admitting: Internal Medicine

## 2010-10-18 ENCOUNTER — Encounter (HOSPITAL_COMMUNITY): Payer: Self-pay

## 2010-10-18 ENCOUNTER — Emergency Department (HOSPITAL_COMMUNITY)
Admission: EM | Admit: 2010-10-18 | Discharge: 2010-10-19 | Disposition: A | Discharge status: Home / Self Care | Payer: Self-pay | Attending: Emergency Medicine | Admitting: Emergency Medicine

## 2010-10-18 DIAGNOSIS — W108XXA Fall (on) (from) other stairs and steps, initial encounter: Secondary | ICD-10-CM | POA: Insufficient documentation

## 2010-10-18 DIAGNOSIS — Z87891 Personal history of nicotine dependence: Secondary | ICD-10-CM | POA: Insufficient documentation

## 2010-10-18 DIAGNOSIS — S2231XA Fracture of one rib, right side, initial encounter for closed fracture: Secondary | ICD-10-CM

## 2010-10-18 DIAGNOSIS — S2239XA Fracture of one rib, unspecified side, initial encounter for closed fracture: Secondary | ICD-10-CM | POA: Insufficient documentation

## 2010-10-18 HISTORY — DX: Other injury of unspecified body region, initial encounter: T14.8XXA

## 2010-10-18 NOTE — ED Notes (Signed)
Tripped and fell 2 days ago, cont. To have right side rib pain

## 2010-10-19 ENCOUNTER — Emergency Department (HOSPITAL_COMMUNITY): Payer: Self-pay

## 2010-10-19 MED ORDER — OXYCODONE-ACETAMINOPHEN 5-325 MG PO TABS
1.0000 | ORAL_TABLET | Freq: Once | ORAL | Status: AC
  Administered 2010-10-19: 1 via ORAL
  Filled 2010-10-19: qty 1

## 2010-10-19 MED ORDER — OXYCODONE-ACETAMINOPHEN 5-325 MG PO TABS: 1.0000 | ORAL_TABLET | Freq: Four times a day (QID) | ORAL | Status: AC | PRN
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
## 2010-10-22 ENCOUNTER — Emergency Department (HOSPITAL_COMMUNITY)
Admission: EM | Admit: 2010-10-22 | Discharge: 2010-10-23 | Disposition: A | Discharge status: Home / Self Care | Payer: Self-pay | Attending: Emergency Medicine | Admitting: Emergency Medicine

## 2010-10-22 ENCOUNTER — Encounter (HOSPITAL_COMMUNITY): Payer: Self-pay | Admitting: *Deleted

## 2010-10-22 DIAGNOSIS — R0789 Other chest pain: Secondary | ICD-10-CM

## 2010-10-22 DIAGNOSIS — R071 Chest pain on breathing: Secondary | ICD-10-CM | POA: Insufficient documentation

## 2010-10-22 DIAGNOSIS — I10 Essential (primary) hypertension: Secondary | ICD-10-CM | POA: Insufficient documentation

## 2010-10-22 DIAGNOSIS — Z87891 Personal history of nicotine dependence: Secondary | ICD-10-CM | POA: Insufficient documentation

## 2010-10-22 NOTE — ED Notes (Signed)
Pt reports pain in right side due to fractured rib.  Reports pain radiating to left side and left kidney area.  No distress noted.

## 2010-10-22 NOTE — ED Notes (Signed)
Pt c/o pain to right and left side rib pain

## 2010-10-23 MED ORDER — OXYCODONE-ACETAMINOPHEN 5-325 MG PO TABS
1.0000 | ORAL_TABLET | Freq: Once | ORAL | Status: AC
  Administered 2010-10-23: 1 via ORAL
  Filled 2010-10-23: qty 1

## 2010-10-23 MED ORDER — IBUPROFEN 800 MG PO TABS: 800.0000 mg | ORAL_TABLET | Freq: Three times a day (TID) | ORAL | Status: AC
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
## 2010-11-02 ENCOUNTER — Ambulatory Visit: Payer: Self-pay | Admitting: Neurology

## 2010-11-29 ENCOUNTER — Emergency Department (HOSPITAL_COMMUNITY)
Admission: EM | Admit: 2010-11-29 | Discharge: 2010-11-30 | Disposition: A | Discharge status: Home / Self Care | Payer: Self-pay | Attending: Emergency Medicine | Admitting: Emergency Medicine

## 2010-11-29 ENCOUNTER — Encounter (HOSPITAL_COMMUNITY): Payer: Self-pay

## 2010-11-29 ENCOUNTER — Other Ambulatory Visit: Payer: Self-pay

## 2010-11-29 DIAGNOSIS — R5381 Other malaise: Secondary | ICD-10-CM | POA: Insufficient documentation

## 2010-11-29 DIAGNOSIS — IMO0001 Reserved for inherently not codable concepts without codable children: Secondary | ICD-10-CM | POA: Insufficient documentation

## 2010-11-29 DIAGNOSIS — R110 Nausea: Secondary | ICD-10-CM | POA: Insufficient documentation

## 2010-11-29 DIAGNOSIS — E876 Hypokalemia: Secondary | ICD-10-CM | POA: Insufficient documentation

## 2010-11-29 DIAGNOSIS — R252 Cramp and spasm: Secondary | ICD-10-CM | POA: Insufficient documentation

## 2010-11-29 DIAGNOSIS — N179 Acute kidney failure, unspecified: Secondary | ICD-10-CM | POA: Insufficient documentation

## 2010-11-29 DIAGNOSIS — E860 Dehydration: Secondary | ICD-10-CM | POA: Insufficient documentation

## 2010-11-29 DIAGNOSIS — Z87891 Personal history of nicotine dependence: Secondary | ICD-10-CM | POA: Insufficient documentation

## 2010-11-29 LAB — POCT I-STAT, CHEM 8
BUN: 22 mg/dL (ref 6–23)
Calcium, Ion: 1.06 mmol/L — ABNORMAL LOW (ref 1.12–1.32)
Chloride: 101 mEq/L (ref 96–112)
Creatinine, Ser: 1.8 mg/dL — ABNORMAL HIGH (ref 0.50–1.35)
Glucose, Bld: 115 mg/dL — ABNORMAL HIGH (ref 70–99)
HCT: 44 % (ref 39.0–52.0)
Hemoglobin: 15 g/dL (ref 13.0–17.0)
Potassium: 3.4 mEq/L — ABNORMAL LOW (ref 3.5–5.1)
Sodium: 140 mEq/L (ref 135–145)
TCO2: 27 mmol/L (ref 0–100)

## 2010-11-29 MED ORDER — SODIUM CHLORIDE 0.9 % IV SOLN
Freq: Once | INTRAVENOUS | Status: AC
  Administered 2010-11-29: 23:00:00 via INTRAVENOUS

## 2010-11-29 MED ORDER — SODIUM CHLORIDE 0.9 % IV SOLN
1.0000 g | Freq: Once | INTRAVENOUS | Status: AC
  Administered 2010-11-29: 1 g via INTRAVENOUS
  Filled 2010-11-29: qty 10

## 2010-11-29 MED ORDER — SODIUM CHLORIDE 0.9 % IV SOLN: Freq: Once | INTRAVENOUS | Status: DC

## 2010-11-29 MED ORDER — POTASSIUM CHLORIDE CRYS ER 20 MEQ PO TBCR
20.0000 meq | EXTENDED_RELEASE_TABLET | Freq: Once | ORAL | Status: AC
  Administered 2010-11-29: 20 meq via ORAL
  Filled 2010-11-29: qty 1

## 2010-11-29 MED ORDER — OXYCODONE-ACETAMINOPHEN 5-325 MG PO TABS
1.0000 | ORAL_TABLET | Freq: Once | ORAL | Status: AC
  Administered 2010-11-29: 1 via ORAL
  Filled 2010-11-29: qty 1

## 2010-11-29 NOTE — ED Notes (Signed)
Pt reports extreme fatigue, cramping in both of his legs, n/v, and has not urinated all day.  Pt reports "almost passing out at work today".
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
## 2010-11-29 NOTE — ED Notes (Signed)
Patient requesting pain medication.

## 2010-12-01 ENCOUNTER — Emergency Department (HOSPITAL_COMMUNITY): Payer: Self-pay

## 2010-12-01 ENCOUNTER — Encounter (HOSPITAL_COMMUNITY): Payer: Self-pay | Admitting: *Deleted

## 2010-12-01 ENCOUNTER — Emergency Department (HOSPITAL_COMMUNITY)
Admission: EM | Admit: 2010-12-01 | Discharge: 2010-12-02 | Disposition: A | Discharge status: Home / Self Care | Payer: Self-pay | Attending: Emergency Medicine | Admitting: Emergency Medicine

## 2010-12-01 DIAGNOSIS — Z87891 Personal history of nicotine dependence: Secondary | ICD-10-CM | POA: Insufficient documentation

## 2010-12-01 DIAGNOSIS — R112 Nausea with vomiting, unspecified: Secondary | ICD-10-CM | POA: Insufficient documentation

## 2010-12-01 DIAGNOSIS — R109 Unspecified abdominal pain: Secondary | ICD-10-CM | POA: Insufficient documentation

## 2010-12-01 DIAGNOSIS — Z79899 Other long term (current) drug therapy: Secondary | ICD-10-CM | POA: Insufficient documentation

## 2010-12-01 DIAGNOSIS — R5381 Other malaise: Secondary | ICD-10-CM | POA: Insufficient documentation

## 2010-12-01 LAB — CBC
Hemoglobin: 13.4 g/dL (ref 13.0–17.0)
MCH: 30.2 pg (ref 26.0–34.0)
Platelets: 333 10*3/uL (ref 150–400)
RBC: 4.43 MIL/uL (ref 4.22–5.81)
WBC: 11 10*3/uL — ABNORMAL HIGH (ref 4.0–10.5)

## 2010-12-01 LAB — COMPREHENSIVE METABOLIC PANEL
ALT: 13 U/L (ref 0–53)
Alkaline Phosphatase: 71 U/L (ref 39–117)
BUN: 13 mg/dL (ref 6–23)
CO2: 29 mEq/L (ref 19–32)
Chloride: 98 mEq/L (ref 96–112)
GFR calc Af Amer: >60 mL/min (ref >60)
GFR calc non Af Amer: >60 mL/min (ref >60)
Glucose, Bld: 102 mg/dL — ABNORMAL HIGH (ref 70–99)
Potassium: 3.6 mEq/L (ref 3.5–5.1)
Sodium: 136 mEq/L (ref 135–145)
Total Bilirubin: 0.3 mg/dL (ref 0.3–1.2)

## 2010-12-01 LAB — DIFFERENTIAL
Eosinophils Absolute: 0.2 10*3/uL (ref 0.0–0.7)
Lymphocytes Relative: 21 % (ref 12–46)
Lymphs Abs: 2.3 10*3/uL (ref 0.7–4.0)
Monocytes Relative: 10 % (ref 3–12)
Neutrophils Relative %: 67 % (ref 43–77)

## 2010-12-01 LAB — LIPASE, BLOOD: Lipase: 32 U/L (ref 11–59)

## 2010-12-01 MED ORDER — SODIUM CHLORIDE 0.9 % IV SOLN: Freq: Once | INTRAVENOUS | Status: DC

## 2010-12-01 MED ORDER — ONDANSETRON HCL 4 MG/2ML IJ SOLN
8.0000 mg | Freq: Once | INTRAMUSCULAR | Status: AC
  Administered 2010-12-01: 8 mg via INTRAVENOUS
  Filled 2010-12-01: qty 2

## 2010-12-01 MED ORDER — MORPHINE SULFATE 4 MG/ML IJ SOLN
4.0000 mg | Freq: Once | INTRAMUSCULAR | Status: AC
  Administered 2010-12-01: 4 mg via INTRAVENOUS
  Filled 2010-12-01: qty 1

## 2010-12-01 MED ORDER — SODIUM CHLORIDE 0.9 % IV BOLUS (SEPSIS)
1000.0000 mL | Freq: Once | INTRAVENOUS | Status: AC
  Administered 2010-12-01: 1000 mL via INTRAVENOUS

## 2010-12-01 MED ORDER — FAMOTIDINE IN NACL 20-0.9 MG/50ML-% IV SOLN
20.0000 mg | Freq: Once | INTRAVENOUS | Status: AC
  Administered 2010-12-01: 20 mg via INTRAVENOUS
  Filled 2010-12-01: qty 50

## 2010-12-01 NOTE — ED Notes (Addendum)
Pt presents with emesis x 1 hr prior to arrival,  Pt states "doesn't feel right". Pt was here  Wednesday  Evening secondary to dehydration, Infused  2 liters of NS and calcium, plus given potassium PO. Pt states has been voiding WNL.  Cough x 2 weeks productive white sputum reported , none seen at this time. Pt states has general achy and tired feeling all day long.
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
## 2010-12-01 NOTE — ED Notes (Signed)
Vomiting , with pain lt side x30 min

## 2010-12-02 LAB — URINALYSIS, ROUTINE W REFLEX MICROSCOPIC
Bilirubin Urine: NEGATIVE
Hgb urine dipstick: NEGATIVE
Ketones, ur: NEGATIVE mg/dL
Nitrite: NEGATIVE
Protein, ur: NEGATIVE mg/dL
Specific Gravity, Urine: <1.005 — ABNORMAL LOW (ref 1.005–1.030)
Urobilinogen, UA: 0.2 mg/dL (ref 0.0–1.0)

## 2010-12-02 MED ORDER — SODIUM CHLORIDE 0.9 % IV BOLUS (SEPSIS): 1000.0000 mL | Freq: Once | INTRAVENOUS | Status: DC

## 2010-12-02 MED ORDER — ONDANSETRON HCL 4 MG PO TABS: 4.0000 mg | ORAL_TABLET | Freq: Four times a day (QID) | ORAL | Status: AC

## 2010-12-02 MED ORDER — IOHEXOL 300 MG/ML  SOLN
100.0000 mL | Freq: Once | INTRAMUSCULAR | Status: AC | PRN
  Administered 2010-12-02: 100 mL via INTRAVENOUS

## 2010-12-02 MED ORDER — OXYCODONE-ACETAMINOPHEN 5-325 MG PO TABS: 1.0000 | ORAL_TABLET | ORAL | Status: AC | PRN

## 2010-12-02 MED ORDER — HYDROMORPHONE HCL 1 MG/ML IJ SOLN
1.0000 mg | Freq: Once | INTRAMUSCULAR | Status: AC
  Administered 2010-12-02: 1 mg via INTRAVENOUS
  Filled 2010-12-02: qty 1

## 2010-12-02 NOTE — Progress Notes (Addendum)
1610 Assumed care/dispostion of patient. Patient with n,v diarrhea today. Seen in the ER two days ago with dehydration requiring IVF. Back today with n,,v d. Labs today improved over those 11/29/10. UA negative. CT negative with exception of spurious finding of possible mesenteric venous occlusion vs blood flux. Spoke with radiologist, Dr. Zonia Kief. He did not feel it was a primary dx. Patient's pain was LLQ with some radiation to left flank. Pain has been pain free. Now with c/o myalgias, fatigue. Review of labs unremarkable. 0200 Spoke with Dr. Rito Ehrlich, hospitalist. He agrees that no further imaging or testing needed. Patient has PCP, Dr. Yetta Barre with [facility]. Follow up with PCP is reasonable. Patient given additional IVF, analgesics. He has taken PO fluids. No further nausea, vomiting. Myalgias  improved with analgesics.Patient discharged with antiemetics and analgesics. Follow up with PCP this week.Work note provided. Ct Abdomen Pelvis W Contrast  12/02/2010  *RADIOLOGY REPORT*  Clinical Data: Left-sided abdominal pain and vomiting for 2 days  CT ABDOMEN AND PELVIS WITH CONTRAST  Technique:  Multidetector CT imaging of the abdomen and pelvis was performed following the standard protocol during bolus administration of intravenous contrast.  Contrast: OMNIPAQUE IOHEXOL 300 MG/ML IV SOLN  Comparison: 01/25/2003  Findings: The lung bases are clear.  Small cyst in the dome of the liver.  No other focal liver lesions.  Normal spleen size.  The gallbladder, bile ducts, pancreas, adrenal glands, kidneys, stomach, small bowel, abdominal aorta, and retroperitoneal lymph nodes are unremarkable.  No free air or free fluid in the abdomen. There is a suggestion of some heterogeneous low density within the otherwise opacified superior mesenteric veins.  This probably represents an flux of unopacified blood.  However, focal mesenteric vein thrombosis is not excluded.  No evidence of portal venous thrombosis.   Pelvis:  The bladder wall is not thickened.  No free or loculated pelvic fluid collections.  No pelvic lymphadenopathy.  The appendix is normal.  No inflammatory changes around the sigmoid colon. Prostate gland is not significantly enlarged.  Normal alignment of the lumbar spine.  Schmorl's nodes.  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  No significant solid organ mass, lymphadenopathy, or inflammatory changes.  Original Report Authenticated By: Marlon Pel, M.D.   Pt feels improved after observation and/or treatment in ED.Pt stable in ED with no significant deterioration in condition.The patient appears reasonably screened and/or stabilized for discharge and I doubt any other medical condition or other [facility] requiring further screening, evaluation, or treatment in the ED at this time prior to discharge.

## 2010-12-02 NOTE — ED Notes (Signed)
Pt given sandwich and soft drink.  
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
## 2010-12-04 ENCOUNTER — Emergency Department (HOSPITAL_COMMUNITY): Payer: Self-pay

## 2010-12-04 ENCOUNTER — Encounter (HOSPITAL_COMMUNITY): Payer: Self-pay | Admitting: Emergency Medicine

## 2010-12-04 ENCOUNTER — Emergency Department (HOSPITAL_COMMUNITY)
Admission: EM | Admit: 2010-12-04 | Discharge: 2010-12-04 | Disposition: A | Discharge status: Home / Self Care | Payer: Self-pay | Attending: Emergency Medicine | Admitting: Emergency Medicine

## 2010-12-04 DIAGNOSIS — I10 Essential (primary) hypertension: Secondary | ICD-10-CM | POA: Insufficient documentation

## 2010-12-04 DIAGNOSIS — Z87891 Personal history of nicotine dependence: Secondary | ICD-10-CM | POA: Insufficient documentation

## 2010-12-04 DIAGNOSIS — R5381 Other malaise: Secondary | ICD-10-CM | POA: Insufficient documentation

## 2010-12-04 DIAGNOSIS — R350 Frequency of micturition: Secondary | ICD-10-CM | POA: Insufficient documentation

## 2010-12-04 DIAGNOSIS — R42 Dizziness and giddiness: Secondary | ICD-10-CM | POA: Insufficient documentation

## 2010-12-04 LAB — BASIC METABOLIC PANEL
BUN: 9 mg/dL (ref 6–23)
Calcium: 9.3 mg/dL (ref 8.4–10.5)
GFR calc Af Amer: >90 mL/min (ref >90)
GFR calc non Af Amer: >90 mL/min (ref >90)
Glucose, Bld: 85 mg/dL (ref 70–99)
Sodium: 141 mEq/L (ref 135–145)

## 2010-12-04 LAB — CBC
MCH: 29.8 pg (ref 26.0–34.0)
MCHC: 32.6 g/dL (ref 30.0–36.0)
MCV: 91.3 fL (ref 78.0–100.0)
Platelets: 343 10*3/uL (ref 150–400)
RBC: 4.7 MIL/uL (ref 4.22–5.81)

## 2010-12-04 LAB — DIFFERENTIAL
Basophils Relative: 0 % (ref 0–1)
Eosinophils Absolute: 0.1 10*3/uL (ref 0.0–0.7)
Eosinophils Relative: 1 % (ref 0–5)
Lymphs Abs: 2.3 10*3/uL (ref 0.7–4.0)
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
## 2010-12-04 NOTE — ED Notes (Signed)
Patient reports dizziness, weakness, blurred vision, and urinary frequency. Patient states "i have been up here twice before in the past week and I was dehydrated. They had to give me fluids both time." Patient denies any nausea, vomiting, or diarrhea. Patient does report some left flank pain.

## 2010-12-04 NOTE — ED Notes (Signed)
Pt says he has been seen here x2 for dehydration.  No NVD., feels dizzy, weak. And has pain in lt flank.  Alert,NAD.

## 2010-12-05 ENCOUNTER — Inpatient Hospital Stay (HOSPITAL_COMMUNITY): Admit: 2010-12-05 | Payer: Self-pay

## 2010-12-05 LAB — POCT I-STAT, CHEM 8
Calcium, Ion: 1.16 mmol/L (ref 1.12–1.32)
Creatinine, Ser: 0.9 mg/dL (ref 0.50–1.35)
Glucose, Bld: 82 mg/dL (ref 70–99)
Hemoglobin: 15 g/dL (ref 13.0–17.0)
Potassium: 4 mEq/L (ref 3.5–5.1)

## 2011-04-03 ENCOUNTER — Encounter (HOSPITAL_COMMUNITY): Payer: Self-pay | Admitting: *Deleted

## 2011-04-03 ENCOUNTER — Emergency Department (HOSPITAL_COMMUNITY)
Admission: EM | Admit: 2011-04-03 | Discharge: 2011-04-04 | Disposition: A | Discharge status: Home / Self Care | Payer: Self-pay | Attending: Internal Medicine | Admitting: Internal Medicine

## 2011-04-03 ENCOUNTER — Other Ambulatory Visit: Payer: Self-pay

## 2011-04-03 ENCOUNTER — Emergency Department (HOSPITAL_COMMUNITY): Payer: Self-pay

## 2011-04-03 DIAGNOSIS — Z7982 Long term (current) use of aspirin: Secondary | ICD-10-CM | POA: Insufficient documentation

## 2011-04-03 DIAGNOSIS — R079 Chest pain, unspecified: Secondary | ICD-10-CM | POA: Insufficient documentation

## 2011-04-03 DIAGNOSIS — I498 Other specified cardiac arrhythmias: Secondary | ICD-10-CM | POA: Insufficient documentation

## 2011-04-03 DIAGNOSIS — R112 Nausea with vomiting, unspecified: Secondary | ICD-10-CM | POA: Insufficient documentation

## 2011-04-03 DIAGNOSIS — I10 Essential (primary) hypertension: Secondary | ICD-10-CM | POA: Insufficient documentation

## 2011-04-03 DIAGNOSIS — R05 Cough: Secondary | ICD-10-CM | POA: Insufficient documentation

## 2011-04-03 DIAGNOSIS — R61 Generalized hyperhidrosis: Secondary | ICD-10-CM | POA: Insufficient documentation

## 2011-04-03 DIAGNOSIS — Z87891 Personal history of nicotine dependence: Secondary | ICD-10-CM | POA: Insufficient documentation

## 2011-04-03 DIAGNOSIS — R059 Cough, unspecified: Secondary | ICD-10-CM | POA: Insufficient documentation

## 2011-04-03 LAB — BASIC METABOLIC PANEL
BUN: 12 mg/dL (ref 6–23)
Calcium: 9.2 mg/dL (ref 8.4–10.5)
GFR calc Af Amer: >90 mL/min (ref >90)
GFR calc non Af Amer: 84 mL/min — ABNORMAL LOW (ref >90)
Glucose, Bld: 106 mg/dL — ABNORMAL HIGH (ref 70–99)

## 2011-04-03 LAB — CBC
HCT: 44.2 % (ref 39.0–52.0)
Hemoglobin: 14.7 g/dL (ref 13.0–17.0)
MCH: 29.3 pg (ref 26.0–34.0)
MCHC: 33.3 g/dL (ref 30.0–36.0)
RDW: 13 % (ref 11.5–15.5)

## 2011-04-03 MED ORDER — NITROGLYCERIN 0.4 MG SL SUBL
0.4000 mg | SUBLINGUAL_TABLET | SUBLINGUAL | Status: DC | PRN
  Administered 2011-04-03: 0.4 mg via SUBLINGUAL
  Filled 2011-04-03: qty 25

## 2011-04-03 MED ORDER — KETOROLAC TROMETHAMINE 30 MG/ML IJ SOLN
30.0000 mg | Freq: Once | INTRAMUSCULAR | Status: AC
  Administered 2011-04-03: 30 mg via INTRAVENOUS
  Filled 2011-04-03: qty 1

## 2011-04-03 MED ORDER — IPRATROPIUM BROMIDE 0.02 % IN SOLN
0.5000 mg | Freq: Once | RESPIRATORY_TRACT | Status: AC
Start: 2011-04-03 — End: 2011-04-03
  Administered 2011-04-03: 0.5 mg via RESPIRATORY_TRACT
  Filled 2011-04-03: qty 2.5

## 2011-04-03 MED ORDER — ALBUTEROL SULFATE (5 MG/ML) 0.5% IN NEBU
2.5000 mg | INHALATION_SOLUTION | Freq: Once | RESPIRATORY_TRACT | Status: AC
  Administered 2011-04-03: 2.5 mg via RESPIRATORY_TRACT
  Filled 2011-04-03: qty 0.5

## 2011-04-03 MED ORDER — ASPIRIN 81 MG PO CHEW
324.0000 mg | CHEWABLE_TABLET | Freq: Once | ORAL | Status: AC
  Administered 2011-04-03: 324 mg via ORAL
  Filled 2011-04-03: qty 4

## 2011-04-03 NOTE — ED Notes (Signed)
Family at bedside. 

## 2011-04-03 NOTE — ED Notes (Signed)
Pt c/o chest pressure and tightness radiating down his left arm x 2 days off and on. Pt states that it feels like someone sitting on his chest. Denies shortness of breath or nausea.

## 2011-04-03 NOTE — ED Notes (Signed)
Patient transported to X-ray 
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
## 2011-04-03 NOTE — ED Notes (Signed)
Dr notified of pt's desire for percocets.

## 2011-04-03 NOTE — ED Notes (Signed)
Pt returned from xray

## 2011-04-03 NOTE — ED Provider Notes (Signed)
I assumed care from Dr. Preston Fleeting with the understanding that should his pain not resolve with toradol, I was to consult medicine for admission for chest pain rule out.  I checked on the patient and he was not feeling any better.  Will consult medicine for admission.  I spoke with Dr. Orvan Falconer who will see the patient in the ED.  Geoffery Lyons, MD 04/03/11 [phone]

## 2011-04-03 NOTE — ED Notes (Signed)
Pt resting in bed.  Pt states he has some discomfort in chest.  Pt refused nitro SL

## 2011-04-03 NOTE — ED Notes (Signed)
RT paged for Breathing tx for pt

## 2011-04-04 NOTE — ED Notes (Signed)
Dr. Orvan Falconer in ED-after seeing pt, states pt will be discharged home with f/u with [facility].  Per. Dr. Orvan Falconer, their office will call to schedule appt to see.
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
## 2011-04-04 NOTE — ED Notes (Signed)
Left in c/o family for transport home; a&ox4; in no apparent distress; f/u information provided.  Verbalizes understanding.

## 2011-05-12 ENCOUNTER — Emergency Department (HOSPITAL_COMMUNITY): Payer: Self-pay

## 2011-05-12 ENCOUNTER — Emergency Department (HOSPITAL_COMMUNITY)
Admission: EM | Admit: 2011-05-12 | Discharge: 2011-05-12 | Disposition: A | Discharge status: Home Health | Payer: Self-pay | Attending: Emergency Medicine | Admitting: Emergency Medicine

## 2011-05-12 ENCOUNTER — Encounter (HOSPITAL_COMMUNITY): Payer: Self-pay

## 2011-05-12 DIAGNOSIS — L02519 Cutaneous abscess of unspecified hand: Secondary | ICD-10-CM | POA: Insufficient documentation

## 2011-05-12 DIAGNOSIS — Z79899 Other long term (current) drug therapy: Secondary | ICD-10-CM | POA: Insufficient documentation

## 2011-05-12 DIAGNOSIS — L03011 Cellulitis of right finger: Secondary | ICD-10-CM

## 2011-05-12 DIAGNOSIS — I10 Essential (primary) hypertension: Secondary | ICD-10-CM | POA: Insufficient documentation

## 2011-05-12 DIAGNOSIS — L03019 Cellulitis of unspecified finger: Secondary | ICD-10-CM | POA: Insufficient documentation

## 2011-05-12 DIAGNOSIS — Z87891 Personal history of nicotine dependence: Secondary | ICD-10-CM | POA: Insufficient documentation

## 2011-05-12 MED ORDER — CEPHALEXIN 500 MG PO CAPS: ORAL_CAPSULE | ORAL | Status: AC

## 2011-05-12 MED ORDER — OXYCODONE-ACETAMINOPHEN 5-325 MG PO TABS: 2.0000 | ORAL_TABLET | ORAL | Status: AC | PRN

## 2011-05-12 MED ORDER — OXYCODONE-ACETAMINOPHEN 5-325 MG PO TABS
2.0000 | ORAL_TABLET | Freq: Once | ORAL | Status: AC
  Administered 2011-05-12: 2 via ORAL
  Filled 2011-05-12: qty 2

## 2011-05-12 NOTE — ED Notes (Signed)
Pt has scabbed over abrasion to the rt index finger. Pt states he was using a grinder and cut it 3 days ago and was unable to seek treatment at the time. Pt's finger is swollen at the knuckle and he states he is unable to bend the finger. No drainage or redness noted.

## 2011-05-12 NOTE — Discharge Instructions (Signed)
Return sooner to the emergency department if you develop fever, pus drainage, new redness, uncontrolled pain, red streaks up your hand or arm, confusion, shortness of breath, new rash, lightheadedness, or other concerns such as weakness or new numbness to your hand.
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
## 2011-05-12 NOTE — ED Notes (Signed)
Cut right hand index finger w/ grinder 3 days ago

## 2011-07-03 ENCOUNTER — Encounter (HOSPITAL_COMMUNITY): Payer: Self-pay | Admitting: Emergency Medicine

## 2011-07-03 ENCOUNTER — Emergency Department (HOSPITAL_COMMUNITY)
Admission: EM | Admit: 2011-07-03 | Discharge: 2011-07-03 | Disposition: A | Discharge status: Home / Self Care | Payer: Self-pay | Attending: Emergency Medicine | Admitting: Emergency Medicine

## 2011-07-03 DIAGNOSIS — M542 Cervicalgia: Secondary | ICD-10-CM | POA: Insufficient documentation

## 2011-07-03 DIAGNOSIS — W2203XA Walked into furniture, initial encounter: Secondary | ICD-10-CM | POA: Insufficient documentation

## 2011-07-03 DIAGNOSIS — R209 Unspecified disturbances of skin sensation: Secondary | ICD-10-CM | POA: Insufficient documentation

## 2011-07-03 DIAGNOSIS — G8929 Other chronic pain: Secondary | ICD-10-CM | POA: Insufficient documentation

## 2011-07-03 DIAGNOSIS — I10 Essential (primary) hypertension: Secondary | ICD-10-CM | POA: Insufficient documentation

## 2011-07-03 DIAGNOSIS — S5000XA Contusion of unspecified elbow, initial encounter: Secondary | ICD-10-CM | POA: Insufficient documentation

## 2011-07-03 DIAGNOSIS — M25529 Pain in unspecified elbow: Secondary | ICD-10-CM | POA: Insufficient documentation

## 2011-07-03 DIAGNOSIS — S5002XA Contusion of left elbow, initial encounter: Secondary | ICD-10-CM

## 2011-07-03 MED ORDER — NAPROXEN 500 MG PO TABS: 500.0000 mg | ORAL_TABLET | Freq: Two times a day (BID) | ORAL | Status: DC

## 2011-07-03 MED ORDER — OXYCODONE-ACETAMINOPHEN 5-325 MG PO TABS
2.0000 | ORAL_TABLET | Freq: Once | ORAL | Status: AC
  Administered 2011-07-03: 2 via ORAL
  Filled 2011-07-03: qty 2

## 2011-07-03 NOTE — ED Notes (Addendum)
Patient talking on phone with wife, telling wife he will be there in a minute at the car so she can take him home. Patient notified that he needs someone to take him home due to medication he is receiving and notified that he can get a dui if he was to drive after what he has taken. Patient verbalized understanding and stated that wife was taking him home. Patient then began cursing at staff for asking him about his ride and patient left room.

## 2011-07-03 NOTE — Discharge Instructions (Signed)
Contusion  A contusion is a deep bruise. Contusions happen when an injury causes bleeding under the skin. Signs of bruising include pain, puffiness (swelling), and discolored skin. The contusion may turn blue, purple, or yellow.  HOME CARE    Put ice on the injured area.   Put ice in a plastic bag.   Place a towel between your skin and the bag.   Leave the ice on for 15 to 20 minutes, 3 to 4 times a day.   Only take medicine as told by your doctor.   Rest the injured area.   If possible, raise (elevate) the injured area to lessen puffiness.  GET HELP RIGHT AWAY IF:    You have more bruising or puffiness.   You have pain that is getting worse.   Your puffiness or pain is not helped by medicine.  MAKE SURE YOU:    Understand these instructions.   Will watch your condition.   Will get help right away if you are not doing well or get worse.  Document Released: 08/08/2007 Document Revised: 02/08/2011 Document Reviewed: 12/25/2010  [employer] Patient Information 2012 [employer].

## 2011-07-03 NOTE — ED Notes (Signed)
Patient complaining of left elbow pain. States that he hit elbow on bed several days ago, was seen, had a xray, and diagnosed with tendonitis. Complaining of pain down arm to fingers on both sides. States tramadol isn't strong enough for pain and that he is used to taking oxycodone.
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
## 2011-07-31 ENCOUNTER — Other Ambulatory Visit: Payer: Self-pay

## 2011-07-31 DIAGNOSIS — I10 Essential (primary) hypertension: Secondary | ICD-10-CM

## 2011-07-31 MED ORDER — LISINOPRIL-HYDROCHLOROTHIAZIDE 20-12.5 MG PO TABS: 1.0000 | ORAL_TABLET | Freq: Every day | ORAL | Status: DC

## 2011-10-12 ENCOUNTER — Emergency Department (HOSPITAL_COMMUNITY)
Admission: EM | Admit: 2011-10-12 | Discharge: 2011-10-12 | Disposition: A | Discharge status: Home / Self Care | Payer: Self-pay | Attending: Emergency Medicine | Admitting: Emergency Medicine

## 2011-10-12 ENCOUNTER — Encounter (HOSPITAL_COMMUNITY): Payer: Self-pay | Admitting: *Deleted

## 2011-10-12 DIAGNOSIS — Z79899 Other long term (current) drug therapy: Secondary | ICD-10-CM | POA: Insufficient documentation

## 2011-10-12 DIAGNOSIS — Z886 Allergy status to analgesic agent status: Secondary | ICD-10-CM | POA: Insufficient documentation

## 2011-10-12 DIAGNOSIS — F17200 Nicotine dependence, unspecified, uncomplicated: Secondary | ICD-10-CM | POA: Insufficient documentation

## 2011-10-12 DIAGNOSIS — L02413 Cutaneous abscess of right upper limb: Secondary | ICD-10-CM

## 2011-10-12 DIAGNOSIS — Z882 Allergy status to sulfonamides status: Secondary | ICD-10-CM | POA: Insufficient documentation

## 2011-10-12 DIAGNOSIS — I10 Essential (primary) hypertension: Secondary | ICD-10-CM | POA: Insufficient documentation

## 2011-10-12 DIAGNOSIS — IMO0002 Reserved for concepts with insufficient information to code with codable children: Secondary | ICD-10-CM | POA: Insufficient documentation

## 2011-10-12 MED ORDER — IBUPROFEN 800 MG PO TABS
800.0000 mg | ORAL_TABLET | Freq: Once | ORAL | Status: AC
  Administered 2011-10-12: 800 mg via ORAL
  Filled 2011-10-12: qty 1

## 2011-10-12 MED ORDER — CEPHALEXIN 500 MG PO CAPS: 500.0000 mg | ORAL_CAPSULE | Freq: Four times a day (QID) | ORAL | Status: AC

## 2011-10-12 MED ORDER — ACETAMINOPHEN 325 MG PO TABS
650.0000 mg | ORAL_TABLET | Freq: Once | ORAL | Status: AC
  Administered 2011-10-12: 650 mg via ORAL
  Filled 2011-10-12: qty 2

## 2011-10-12 MED ORDER — DOXYCYCLINE HYCLATE 100 MG PO TABS
100.0000 mg | ORAL_TABLET | Freq: Once | ORAL | Status: AC
  Administered 2011-10-12: 100 mg via ORAL
  Filled 2011-10-12: qty 1

## 2011-10-12 MED ORDER — CEPHALEXIN 500 MG PO CAPS
500.0000 mg | ORAL_CAPSULE | Freq: Once | ORAL | Status: AC
  Administered 2011-10-12: 500 mg via ORAL
  Filled 2011-10-12: qty 1

## 2011-10-12 MED ORDER — IBUPROFEN 800 MG PO TABS: 800.0000 mg | ORAL_TABLET | Freq: Three times a day (TID) | ORAL | Status: AC

## 2011-10-12 NOTE — ED Notes (Signed)
Abscess to rt forearm x 2-3 days
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
## 2011-10-13 NOTE — ED Provider Notes (Signed)
Medical screening examination/treatment/procedure(s) were performed by non-physician practitioner and as supervising physician I was immediately available for consultation/collaboration. Devoria Albe, MD, FACEP   Ward Givens, MD 10/13/11 [phone]

## 2011-10-15 LAB — CULTURE, ROUTINE-ABSCESS: Gram Stain: NONE SEEN

## 2011-10-15 NOTE — ED Notes (Signed)
+   MRSA Chart sent to EDP office for review. 

## 2011-10-18 NOTE — ED Notes (Addendum)
Chart returned from EDP office . Rx for Doxycyclin 100 mg tab #14 one tab po BID x 7 days  Written By Meribeth Mattes

## 2011-10-18 NOTE — ED Notes (Signed)
Left message for patient to return call.

## 2011-10-20 NOTE — ED Notes (Signed)
I left a message for the patient to return my call.

## 2011-10-21 NOTE — ED Notes (Signed)
Left message for patient to call back  

## 2011-11-16 ENCOUNTER — Other Ambulatory Visit (HOSPITAL_COMMUNITY): Payer: Self-pay | Admitting: Neurological Surgery

## 2011-11-16 DIAGNOSIS — M47812 Spondylosis without myelopathy or radiculopathy, cervical region: Secondary | ICD-10-CM

## 2011-11-20 ENCOUNTER — Ambulatory Visit (HOSPITAL_COMMUNITY)
Admission: RE | Admit: 2011-11-20 | Discharge: 2011-11-20 | Disposition: A | Discharge status: Home / Self Care | Payer: Self-pay | Source: Ambulatory Visit | Attending: Neurological Surgery | Admitting: Neurological Surgery

## 2011-11-20 ENCOUNTER — Other Ambulatory Visit (HOSPITAL_COMMUNITY): Payer: Self-pay | Admitting: Neurological Surgery

## 2011-11-20 DIAGNOSIS — M542 Cervicalgia: Secondary | ICD-10-CM

## 2011-11-20 DIAGNOSIS — R209 Unspecified disturbances of skin sensation: Secondary | ICD-10-CM | POA: Insufficient documentation

## 2011-11-20 DIAGNOSIS — M5030 Other cervical disc degeneration, unspecified cervical region: Secondary | ICD-10-CM | POA: Insufficient documentation

## 2011-11-20 DIAGNOSIS — M47812 Spondylosis without myelopathy or radiculopathy, cervical region: Secondary | ICD-10-CM

## 2011-11-29 ENCOUNTER — Emergency Department (HOSPITAL_COMMUNITY)
Admission: EM | Admit: 2011-11-29 | Discharge: 2011-11-29 | Disposition: A | Discharge status: Home / Self Care | Payer: Self-pay | Attending: Emergency Medicine | Admitting: Emergency Medicine

## 2011-11-29 ENCOUNTER — Emergency Department (HOSPITAL_COMMUNITY): Payer: Self-pay

## 2011-11-29 ENCOUNTER — Encounter (HOSPITAL_COMMUNITY): Payer: Self-pay

## 2011-11-29 DIAGNOSIS — I10 Essential (primary) hypertension: Secondary | ICD-10-CM | POA: Insufficient documentation

## 2011-11-29 DIAGNOSIS — R071 Chest pain on breathing: Secondary | ICD-10-CM | POA: Insufficient documentation

## 2011-11-29 DIAGNOSIS — R0789 Other chest pain: Secondary | ICD-10-CM

## 2011-11-29 DIAGNOSIS — Z87891 Personal history of nicotine dependence: Secondary | ICD-10-CM | POA: Insufficient documentation

## 2011-11-29 HISTORY — DX: Fracture of one rib, unspecified side, initial encounter for closed fracture: S22.39XA

## 2011-11-29 MED ORDER — ONDANSETRON HCL 4 MG PO TABS
4.0000 mg | ORAL_TABLET | Freq: Once | ORAL | Status: DC
  Filled 2011-11-29: qty 1

## 2011-11-29 MED ORDER — METHOCARBAMOL 500 MG PO TABS
1000.0000 mg | ORAL_TABLET | Freq: Once | ORAL | Status: DC
  Filled 2011-11-29: qty 2

## 2011-11-29 MED ORDER — IBUPROFEN 800 MG PO TABS
800.0000 mg | ORAL_TABLET | Freq: Once | ORAL | Status: DC
  Filled 2011-11-29: qty 1

## 2011-11-29 MED ORDER — MORPHINE SULFATE 4 MG/ML IJ SOLN
8.0000 mg | Freq: Once | INTRAMUSCULAR | Status: AC
  Administered 2011-11-29: 8 mg via INTRAMUSCULAR
  Filled 2011-11-29: qty 2
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
## 2011-11-29 NOTE — ED Notes (Signed)
Patient extremely beligerent and rude when attempting to give medications.  Would only take 8mg  Morphine IM. Refused all other pain meds.  He stated "this hospital ain't worth s___" and "these doctors around here are crap, I told him what I needed.  That other medicine is s___"    Stated, "Just give me the d___morphine and give me my discharge papers so I can get the H___ out of here."   Ambulated out of unit w/out difficulty.

## 2011-11-29 NOTE — ED Notes (Signed)
Left rib pain, think I irritated it lifting and straining. History of rib fractures.

## 2011-11-29 NOTE — ED Notes (Signed)
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  He began having pain in L chest, just to L of MCL, below nipple line.  Tender to touch. ? Swelling noted.

## 2011-12-01 NOTE — ED Provider Notes (Signed)
Medical screening examination/treatment/procedure(s) were performed by non-physician practitioner and as supervising physician I was immediately available for consultation/collaboration.  Flint Melter, MD 12/01/11 1214

## 2011-12-25 NOTE — ED Notes (Signed)
No response after 30 days. Chart appended and sent to Medical Records. 

## 2012-05-23 ENCOUNTER — Emergency Department (HOSPITAL_COMMUNITY)
Admission: EM | Admit: 2012-05-23 | Discharge: 2012-05-23 | Disposition: A | Discharge status: Home / Self Care | Payer: Self-pay | Attending: Emergency Medicine | Admitting: Emergency Medicine

## 2012-05-23 ENCOUNTER — Emergency Department (HOSPITAL_COMMUNITY): Payer: Self-pay

## 2012-05-23 ENCOUNTER — Encounter (HOSPITAL_COMMUNITY): Payer: Self-pay | Admitting: *Deleted

## 2012-05-23 ENCOUNTER — Encounter (HOSPITAL_COMMUNITY): Payer: Self-pay | Admitting: Adult Health

## 2012-05-23 DIAGNOSIS — Z87828 Personal history of other (healed) physical injury and trauma: Secondary | ICD-10-CM | POA: Insufficient documentation

## 2012-05-23 DIAGNOSIS — Z87891 Personal history of nicotine dependence: Secondary | ICD-10-CM | POA: Insufficient documentation

## 2012-05-23 DIAGNOSIS — L02519 Cutaneous abscess of unspecified hand: Secondary | ICD-10-CM | POA: Insufficient documentation

## 2012-05-23 DIAGNOSIS — Z79899 Other long term (current) drug therapy: Secondary | ICD-10-CM | POA: Insufficient documentation

## 2012-05-23 DIAGNOSIS — I10 Essential (primary) hypertension: Secondary | ICD-10-CM | POA: Insufficient documentation

## 2012-05-23 DIAGNOSIS — Z7982 Long term (current) use of aspirin: Secondary | ICD-10-CM | POA: Insufficient documentation

## 2012-05-23 DIAGNOSIS — Z8781 Personal history of (healed) traumatic fracture: Secondary | ICD-10-CM | POA: Insufficient documentation

## 2012-05-23 DIAGNOSIS — L03119 Cellulitis of unspecified part of limb: Secondary | ICD-10-CM | POA: Insufficient documentation

## 2012-05-23 DIAGNOSIS — Z8614 Personal history of Methicillin resistant Staphylococcus aureus infection: Secondary | ICD-10-CM | POA: Insufficient documentation

## 2012-05-23 DIAGNOSIS — L03113 Cellulitis of right upper limb: Secondary | ICD-10-CM

## 2012-05-23 LAB — CBC WITH DIFFERENTIAL/PLATELET
Eosinophils Absolute: 0.1 10*3/uL (ref 0.0–0.7)
Eosinophils Relative: 1 % (ref 0–5)
HCT: 41.5 % (ref 39.0–52.0)
Hemoglobin: 14.6 g/dL (ref 13.0–17.0)
Lymphs Abs: 2.3 10*3/uL (ref 0.7–4.0)
MCH: 31.1 pg (ref 26.0–34.0)
MCV: 88.3 fL (ref 78.0–100.0)
Monocytes Absolute: 1 10*3/uL (ref 0.1–1.0)
Monocytes Relative: 9 % (ref 3–12)
Neutrophils Relative %: 70 % (ref 43–77)
RBC: 4.7 MIL/uL (ref 4.22–5.81)

## 2012-05-23 MED ORDER — DOXYCYCLINE HYCLATE 100 MG PO TABS: 100.0000 mg | ORAL_TABLET | Freq: Two times a day (BID) | ORAL | Status: DC

## 2012-05-23 MED ORDER — OXYCODONE-ACETAMINOPHEN 5-325 MG PO TABS
1.0000 | ORAL_TABLET | Freq: Once | ORAL | Status: AC
  Administered 2012-05-23: 1 via ORAL
  Filled 2012-05-23: qty 1

## 2012-05-23 MED ORDER — OXYCODONE HCL 5 MG PO TABS: 5.0000 mg | ORAL_TABLET | ORAL | Status: DC | PRN

## 2012-05-23 NOTE — ED Notes (Signed)
Suture cart at bedside for I&D procedure.

## 2012-05-23 NOTE — ED Notes (Signed)
Sent from [facility] to see Dr. Orlan Leavens, Hand surgeon for right hand cellulitis. Right hand with large induration, redness and pustule. Infection began Tuesday.

## 2012-05-23 NOTE — Consult Note (Signed)
FULL NOTE DICTATED PROCEDURE DONE AT BEDSIDE

## 2012-05-23 NOTE — ED Notes (Signed)
Right hand pain and swelling with redness x 3 days.

## 2012-05-23 NOTE — ED Provider Notes (Signed)
Pt sent over from [facility] with hand abscess.  Dr Melvyn Novas here to see  Benjamin Bucco, MD 05/23/12 2020

## 2012-05-23 NOTE — ED Provider Notes (Signed)
Medical screening examination/treatment/procedure(s) were performed by non-physician practitioner and as supervising physician I was immediately available for consultation/collaboration.   Alyana Kreiter L Nyxon Strupp, MD 05/23/12 2216 
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
## 2012-05-23 NOTE — ED Notes (Signed)
Pt reports redness, swelling and tenderness to top of right hand with no known injury for the past 2 days. Pt is resting comfortably watching tv and given drink per request. .

## 2012-05-23 NOTE — ED Notes (Signed)
Patient transported to X-ray 

## 2012-05-23 NOTE — ED Notes (Signed)
MD at bedside. 

## 2012-05-24 NOTE — Consult Note (Signed)
Benjamin Vaughn, RETTER NO.:  [id number]  MEDICAL RECORD NO.:  [id number]  LOCATION:  [facility]FT21                        FACILITY:  [facility]  PHYSICIAN:  Madelynn Done, MD  DATE OF BIRTH:  1970/07/10  DATE OF CONSULTATION:  05/23/2012 DATE OF DISCHARGE:  05/23/2012                                CONSULTATION   REQUESTING PHYSICIAN:  Bethann Berkshire, MD.  REASON FOR CONSULTATION:  Right hand abscess.  BRIEF HISTORY:  Benjamin Vaughn is the patient known to me, who has had a history of MRSA infection.  The patient presented to the ER with a small pustule over the dorsal aspect of the hand.  He was transferred from [facility] for definitive management of his right hand pustule.  The patient's symptoms have been going on for several days.  He denies any systemic fevers, chills, nausea, vomiting, or other constitutional symptoms.  Again, the patient has had multiple I and D's of abscesses throughout his extremities, and the concern was for recurrent infection.  His medical history, past surgical history, medications, allergies, review of systems as noted in the chart from the [facility]. Those medical record was reviewed.  ON EXAMINATION:  GENERAL:  He is a healthy-appearing male. VITAL SIGNS:  Height and weight are listed in the computer.  He has a normal gait and station.  He had coordination in his left hand.  Normal mood. NEUROLOGIC:  He is alert and oriented to person, place, and time, in no acute distress. EXTREMITIES:  On examination of the right upper extremity, the patient does have a small pustule, less than a nickel size directly over the dorsal aspect of the ring finger, does not appear to involve the joint. He has some surrounding cellulitis.  He is able to make the okay sign, cross his fingers, extend his thumb, extend his digits.  Fingertips are warm and well perfused.  He has good wrist flexion and extension.  He has no streaking, erythema, or  lymphangitis.  He has good wrist and digital mobility.  He has no evidence of tenosynovitis.  He has no wounds volarly.  IMPRESSION: RIGHT HAND SMALL ABSCESS  PROCEDURE NOTE:  After verbal consent was obtained from the patient, we elected to proceed with a small incision and drainage of the abscess area over the dorsal aspect of the hand.  The area was anesthetized with 2 % Xylocaine and 10 mL.  The patient tolerated the local anesthetic. The hand was then prepped and draped in the normal sterile fashion. Time-out was called, correct side was identified, and the procedure then begun.  Attention was then turned to the right hand where over the abscess area, a small incision was then made with a 10 blade scalpel. The dissection then carried down through the skin and subcutaneous tissue.  The abscess area was then carefully evacuated.  Hemostats were used to spread the soft tissues.  There does not appear to be a deep abscess.  This was very superficial, similar to a pimple.  The wound was then thoroughly irrigated.  Copious wound irrigation done throughout. Following this, sterile packing was then applied into the area, and a sterile compressive  bandage was then applied.  The patient tolerated the procedure well.  POSTPROCEDURAL PLAN:  The patient will be discharged home, seen back in the office in approximately 3 days for wound check, packing removal.  He will continue on oral antibiotics, doxycycline, as he has been on this before.  Oral oxycodone for pain, he takes the 5 mg oxycodone tablets, which work well for him.  Ice, activity modification, do not remove the bandage, local wound care instructions were given.  I plan to see him back in the office for a followup.  All questions were answered that were encouraged by him today.  The patient voiced understanding the plan and the reason for followup.     Madelynn Done, MD     FWO/MEDQ  D:  05/23/2012  T:  05/24/2012  Job:   [id number]

## 2012-06-14 ENCOUNTER — Encounter (HOSPITAL_COMMUNITY): Payer: Self-pay

## 2012-06-14 ENCOUNTER — Emergency Department (HOSPITAL_COMMUNITY)
Admission: EM | Admit: 2012-06-14 | Discharge: 2012-06-14 | Disposition: A | Discharge status: Home / Self Care | Payer: Self-pay | Attending: Emergency Medicine | Admitting: Emergency Medicine

## 2012-06-14 ENCOUNTER — Emergency Department (HOSPITAL_COMMUNITY): Payer: Self-pay

## 2012-06-14 DIAGNOSIS — I10 Essential (primary) hypertension: Secondary | ICD-10-CM | POA: Insufficient documentation

## 2012-06-14 DIAGNOSIS — Z87891 Personal history of nicotine dependence: Secondary | ICD-10-CM | POA: Insufficient documentation

## 2012-06-14 DIAGNOSIS — S2239XA Fracture of one rib, unspecified side, initial encounter for closed fracture: Secondary | ICD-10-CM | POA: Insufficient documentation

## 2012-06-14 DIAGNOSIS — S2232XA Fracture of one rib, left side, initial encounter for closed fracture: Secondary | ICD-10-CM

## 2012-06-14 DIAGNOSIS — Z8781 Personal history of (healed) traumatic fracture: Secondary | ICD-10-CM | POA: Insufficient documentation

## 2012-06-14 DIAGNOSIS — Z79899 Other long term (current) drug therapy: Secondary | ICD-10-CM | POA: Insufficient documentation

## 2012-06-14 DIAGNOSIS — Z7982 Long term (current) use of aspirin: Secondary | ICD-10-CM | POA: Insufficient documentation

## 2012-06-14 LAB — URINALYSIS, ROUTINE W REFLEX MICROSCOPIC
Glucose, UA: NEGATIVE mg/dL
Hgb urine dipstick: NEGATIVE
Specific Gravity, Urine: 1.02 (ref 1.005–1.030)
Urobilinogen, UA: 0.2 mg/dL (ref 0.0–1.0)

## 2012-06-14 MED ORDER — OXYCODONE-ACETAMINOPHEN 5-325 MG PO TABS
2.0000 | ORAL_TABLET | Freq: Once | ORAL | Status: AC
  Administered 2012-06-14: 2 via ORAL
  Filled 2012-06-14: qty 2

## 2012-06-14 MED ORDER — OXYCODONE-ACETAMINOPHEN 5-325 MG PO TABS: 2.0000 | ORAL_TABLET | ORAL | Status: DC | PRN
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
## 2012-06-14 NOTE — ED Notes (Signed)
Pt c/o left rib and left lower back pain, states is sharp and stabbing feeling

## 2012-09-08 ENCOUNTER — Emergency Department (HOSPITAL_COMMUNITY): Payer: BC Managed Care – PPO

## 2012-09-08 ENCOUNTER — Emergency Department (HOSPITAL_COMMUNITY)
Admission: EM | Admit: 2012-09-08 | Discharge: 2012-09-08 | Disposition: A | Discharge status: Home / Self Care | Payer: BC Managed Care – PPO | Attending: Emergency Medicine | Admitting: Emergency Medicine

## 2012-09-08 ENCOUNTER — Encounter (HOSPITAL_COMMUNITY): Payer: Self-pay | Admitting: Emergency Medicine

## 2012-09-08 DIAGNOSIS — R358 Other polyuria: Secondary | ICD-10-CM | POA: Insufficient documentation

## 2012-09-08 DIAGNOSIS — I10 Essential (primary) hypertension: Secondary | ICD-10-CM | POA: Insufficient documentation

## 2012-09-08 DIAGNOSIS — R748 Abnormal levels of other serum enzymes: Secondary | ICD-10-CM | POA: Insufficient documentation

## 2012-09-08 DIAGNOSIS — Z79899 Other long term (current) drug therapy: Secondary | ICD-10-CM | POA: Insufficient documentation

## 2012-09-08 DIAGNOSIS — Z87828 Personal history of other (healed) physical injury and trauma: Secondary | ICD-10-CM | POA: Insufficient documentation

## 2012-09-08 DIAGNOSIS — Z87891 Personal history of nicotine dependence: Secondary | ICD-10-CM | POA: Insufficient documentation

## 2012-09-08 DIAGNOSIS — Z8781 Personal history of (healed) traumatic fracture: Secondary | ICD-10-CM | POA: Insufficient documentation

## 2012-09-08 DIAGNOSIS — R109 Unspecified abdominal pain: Secondary | ICD-10-CM | POA: Insufficient documentation

## 2012-09-08 DIAGNOSIS — R3589 Other polyuria: Secondary | ICD-10-CM | POA: Insufficient documentation

## 2012-09-08 DIAGNOSIS — K5900 Constipation, unspecified: Secondary | ICD-10-CM | POA: Insufficient documentation

## 2012-09-08 DIAGNOSIS — R350 Frequency of micturition: Secondary | ICD-10-CM | POA: Insufficient documentation

## 2012-09-08 DIAGNOSIS — Z7982 Long term (current) use of aspirin: Secondary | ICD-10-CM | POA: Insufficient documentation

## 2012-09-08 LAB — CBC WITH DIFFERENTIAL/PLATELET
Basophils Absolute: 0 10*3/uL (ref 0.0–0.1)
HCT: 40.4 % (ref 39.0–52.0)
Lymphocytes Relative: 24 % (ref 12–46)
Monocytes Absolute: 0.8 10*3/uL (ref 0.1–1.0)
Neutro Abs: 3 10*3/uL (ref 1.7–7.7)
Neutrophils Relative %: 59 % (ref 43–77)
RDW: 13.1 % (ref 11.5–15.5)
WBC: 5.1 10*3/uL (ref 4.0–10.5)

## 2012-09-08 LAB — COMPREHENSIVE METABOLIC PANEL
ALT: 156 U/L — ABNORMAL HIGH (ref 0–53)
AST: 143 U/L — ABNORMAL HIGH (ref 0–37)
Alkaline Phosphatase: 84 U/L (ref 39–117)
CO2: 26 mEq/L (ref 19–32)
Chloride: 101 mEq/L (ref 96–112)
Creatinine, Ser: 1.05 mg/dL (ref 0.50–1.35)
GFR calc non Af Amer: 85 mL/min — ABNORMAL LOW (ref >90)
Potassium: 4.3 mEq/L (ref 3.5–5.1)
Sodium: 137 mEq/L (ref 135–145)
Total Bilirubin: 0.5 mg/dL (ref 0.3–1.2)

## 2012-09-08 LAB — URINALYSIS, ROUTINE W REFLEX MICROSCOPIC
Glucose, UA: NEGATIVE mg/dL
Leukocytes, UA: NEGATIVE
Protein, ur: NEGATIVE mg/dL
pH: 7 (ref 5.0–8.0)

## 2012-09-08 MED ORDER — IOHEXOL 300 MG/ML  SOLN
100.0000 mL | Freq: Once | INTRAMUSCULAR | Status: AC | PRN
  Administered 2012-09-08: 100 mL via INTRAVENOUS

## 2012-09-08 MED ORDER — IOHEXOL 300 MG/ML  SOLN
50.0000 mL | Freq: Once | INTRAMUSCULAR | Status: AC | PRN
  Administered 2012-09-08: 50 mL via ORAL

## 2012-09-08 MED ORDER — TRAMADOL HCL 50 MG PO TABS: 50.0000 mg | ORAL_TABLET | Freq: Four times a day (QID) | ORAL | Status: DC | PRN

## 2012-09-08 MED ORDER — FENTANYL CITRATE 0.05 MG/ML IJ SOLN
100.0000 ug | Freq: Once | INTRAMUSCULAR | Status: AC
  Administered 2012-09-08: 100 ug via INTRAVENOUS
  Filled 2012-09-08: qty 2

## 2012-09-08 MED ORDER — FENTANYL CITRATE 0.05 MG/ML IJ SOLN
50.0000 ug | Freq: Once | INTRAMUSCULAR | Status: AC
  Administered 2012-09-08: 50 ug via INTRAVENOUS
  Filled 2012-09-08: qty 2

## 2012-09-08 MED ORDER — SODIUM CHLORIDE 0.9 % IV BOLUS (SEPSIS)
1000.0000 mL | Freq: Once | INTRAVENOUS | Status: AC
  Administered 2012-09-08: 1000 mL via INTRAVENOUS

## 2012-09-08 MED ORDER — SODIUM CHLORIDE 0.9 % IV BOLUS (SEPSIS)
500.0000 mL | Freq: Once | INTRAVENOUS | Status: AC
  Administered 2012-09-08: 500 mL via INTRAVENOUS

## 2012-09-08 MED ORDER — PROMETHAZINE HCL 25 MG PO TABS: 25.0000 mg | ORAL_TABLET | Freq: Four times a day (QID) | ORAL | Status: DC | PRN

## 2012-09-08 NOTE — ED Notes (Signed)
Pt reporting pain in left side and abdomen.  Reports last BM was 2 days ago.  Reporting some painful urination.  Also reporting ring and pinky fingers of left hand are numb.  Reporting nausea as well as back pain.

## 2012-09-08 NOTE — ED Notes (Signed)
Patient c/o left flank pain and constipation x 1 week.  Patient also c/o left arm pain/weakness.  Patient states last BM was 2 days ago.  C/o nausea.
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
## 2013-05-12 ENCOUNTER — Emergency Department (HOSPITAL_COMMUNITY)
Admission: EM | Admit: 2013-05-12 | Discharge: 2013-05-12 | Disposition: A | Discharge status: Home / Self Care | Payer: BC Managed Care – PPO | Attending: Emergency Medicine | Admitting: Emergency Medicine

## 2013-05-12 ENCOUNTER — Encounter (HOSPITAL_COMMUNITY): Payer: Self-pay | Admitting: Emergency Medicine

## 2013-05-12 ENCOUNTER — Emergency Department (HOSPITAL_COMMUNITY): Payer: BC Managed Care – PPO

## 2013-05-12 DIAGNOSIS — Z7982 Long term (current) use of aspirin: Secondary | ICD-10-CM | POA: Insufficient documentation

## 2013-05-12 DIAGNOSIS — Z87828 Personal history of other (healed) physical injury and trauma: Secondary | ICD-10-CM | POA: Insufficient documentation

## 2013-05-12 DIAGNOSIS — Z87891 Personal history of nicotine dependence: Secondary | ICD-10-CM | POA: Insufficient documentation

## 2013-05-12 DIAGNOSIS — R112 Nausea with vomiting, unspecified: Secondary | ICD-10-CM | POA: Insufficient documentation

## 2013-05-12 DIAGNOSIS — N419 Inflammatory disease of prostate, unspecified: Secondary | ICD-10-CM | POA: Insufficient documentation

## 2013-05-12 DIAGNOSIS — Z8781 Personal history of (healed) traumatic fracture: Secondary | ICD-10-CM | POA: Insufficient documentation

## 2013-05-12 DIAGNOSIS — I10 Essential (primary) hypertension: Secondary | ICD-10-CM | POA: Insufficient documentation

## 2013-05-12 DIAGNOSIS — Z79899 Other long term (current) drug therapy: Secondary | ICD-10-CM | POA: Insufficient documentation

## 2013-05-12 DIAGNOSIS — K5900 Constipation, unspecified: Secondary | ICD-10-CM | POA: Insufficient documentation

## 2013-05-12 LAB — CBC WITH DIFFERENTIAL/PLATELET
BASOS PCT: 0 % (ref 0–1)
Basophils Absolute: 0 10*3/uL (ref 0.0–0.1)
Eosinophils Absolute: 0.2 10*3/uL (ref 0.0–0.7)
Eosinophils Relative: 3 % (ref 0–5)
HCT: 40.3 % (ref 39.0–52.0)
HEMOGLOBIN: 13.5 g/dL (ref 13.0–17.0)
LYMPHS ABS: 1.9 10*3/uL (ref 0.7–4.0)
LYMPHS PCT: 21 % (ref 12–46)
MCH: 30.4 pg (ref 26.0–34.0)
MCHC: 33.5 g/dL (ref 30.0–36.0)
MCV: 90.8 fL (ref 78.0–100.0)
MONOS PCT: 10 % (ref 3–12)
Monocytes Absolute: 0.9 10*3/uL (ref 0.1–1.0)
NEUTROS ABS: 5.8 10*3/uL (ref 1.7–7.7)
NEUTROS PCT: 66 % (ref 43–77)
Platelets: 292 10*3/uL (ref 150–400)
RBC: 4.44 MIL/uL (ref 4.22–5.81)
RDW: 12.6 % (ref 11.5–15.5)
WBC: 8.8 10*3/uL (ref 4.0–10.5)

## 2013-05-12 LAB — COMPREHENSIVE METABOLIC PANEL
ALBUMIN: 4.4 g/dL (ref 3.5–5.2)
ALK PHOS: 76 U/L (ref 39–117)
ALT: 50 U/L (ref 0–53)
AST: 44 U/L — ABNORMAL HIGH (ref 0–37)
BILIRUBIN TOTAL: 0.5 mg/dL (ref 0.3–1.2)
BUN: 17 mg/dL (ref 6–23)
CHLORIDE: 98 meq/L (ref 96–112)
CO2: 32 meq/L (ref 19–32)
Calcium: 9.7 mg/dL (ref 8.4–10.5)
Creatinine, Ser: 1.1 mg/dL (ref 0.50–1.35)
GFR calc Af Amer: >90 mL/min (ref >90)
GFR, EST NON AFRICAN AMERICAN: 79 mL/min — AB (ref >90)
Glucose, Bld: 96 mg/dL (ref 70–99)
POTASSIUM: 4.9 meq/L (ref 3.7–5.3)
Sodium: 139 mEq/L (ref 137–147)
Total Protein: 8.5 g/dL — ABNORMAL HIGH (ref 6.0–8.3)

## 2013-05-12 LAB — URINALYSIS, ROUTINE W REFLEX MICROSCOPIC
Bilirubin Urine: NEGATIVE
GLUCOSE, UA: NEGATIVE mg/dL
KETONES UR: NEGATIVE mg/dL
LEUKOCYTES UA: NEGATIVE
Nitrite: NEGATIVE
PROTEIN: NEGATIVE mg/dL
Specific Gravity, Urine: <1.005 — ABNORMAL LOW (ref 1.005–1.030)
Urobilinogen, UA: 0.2 mg/dL (ref 0.0–1.0)
pH: 6 (ref 5.0–8.0)

## 2013-05-12 LAB — LIPASE, BLOOD: Lipase: 28 U/L (ref 11–59)

## 2013-05-12 LAB — URINE MICROSCOPIC-ADD ON

## 2013-05-12 MED ORDER — CIPROFLOXACIN HCL 500 MG PO TABS: 500.0000 mg | ORAL_TABLET | Freq: Two times a day (BID) | ORAL | Status: DC

## 2013-05-12 MED ORDER — GI COCKTAIL ~~LOC~~
30.0000 mL | Freq: Once | ORAL | Status: AC
  Administered 2013-05-12: 30 mL via ORAL
  Filled 2013-05-12: qty 30
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
## 2013-05-12 NOTE — Discharge Instructions (Signed)
Get a bottle of mgcitrate at the drug store.  Drink 1/2 of the bottle.  If you do not have a bowel movement in 2-3 hours,  Then drink the other half.  Follow up with your md in 1-2 weeks

## 2013-05-12 NOTE — ED Notes (Signed)
Pt c/o abd pain, nausea, vomiting X1, constipation, burning with urination and an increase in urination that started a few days ago, last bowel movement was a "few days" ago and "not out of ordinary" per pt. Has been having to use laxatives and "strains"

## 2013-05-12 NOTE — ED Notes (Signed)
MD at bedside. 

## 2013-06-09 ENCOUNTER — Emergency Department (HOSPITAL_COMMUNITY)
Admission: EM | Admit: 2013-06-09 | Discharge: 2013-06-09 | Disposition: A | Discharge status: Home / Self Care | Payer: BC Managed Care – PPO | Attending: Emergency Medicine | Admitting: Emergency Medicine

## 2013-06-09 ENCOUNTER — Encounter (HOSPITAL_COMMUNITY): Payer: Self-pay | Admitting: Emergency Medicine

## 2013-06-09 DIAGNOSIS — Z792 Long term (current) use of antibiotics: Secondary | ICD-10-CM | POA: Insufficient documentation

## 2013-06-09 DIAGNOSIS — Z7982 Long term (current) use of aspirin: Secondary | ICD-10-CM | POA: Insufficient documentation

## 2013-06-09 DIAGNOSIS — Z87891 Personal history of nicotine dependence: Secondary | ICD-10-CM | POA: Insufficient documentation

## 2013-06-09 DIAGNOSIS — Z79899 Other long term (current) drug therapy: Secondary | ICD-10-CM | POA: Insufficient documentation

## 2013-06-09 DIAGNOSIS — L03317 Cellulitis of buttock: Principal | ICD-10-CM

## 2013-06-09 DIAGNOSIS — I10 Essential (primary) hypertension: Secondary | ICD-10-CM | POA: Insufficient documentation

## 2013-06-09 DIAGNOSIS — L0231 Cutaneous abscess of buttock: Secondary | ICD-10-CM | POA: Insufficient documentation

## 2013-06-09 MED ORDER — LIDOCAINE-EPINEPHRINE (PF) 2 %-1:200000 IJ SOLN
INTRAMUSCULAR | Status: AC
  Filled 2013-06-09: qty 20

## 2013-06-09 MED ORDER — DOXYCYCLINE HYCLATE 100 MG PO CAPS: 100.0000 mg | ORAL_CAPSULE | Freq: Two times a day (BID) | ORAL | Status: DC

## 2013-06-09 MED ORDER — LIDOCAINE-EPINEPHRINE 2 %-1:100000 IJ SOLN
20.0000 mL | Freq: Once | INTRAMUSCULAR | Status: DC
  Filled 2013-06-09: qty 20

## 2013-06-09 MED ORDER — LIDOCAINE-EPINEPHRINE (PF) 2 %-1:200000 IJ SOLN: 20.0000 mL | Freq: Once | INTRAMUSCULAR | Status: DC

## 2013-06-09 MED ORDER — POVIDONE-IODINE 10 % EX SOLN
CUTANEOUS | Status: AC
  Filled 2013-06-09: qty 118

## 2013-06-09 NOTE — ED Provider Notes (Signed)
Pt seen by me, procedure done by NP.   Ward GivensIva L Armandina Iman, MD 06/09/13 1004
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
## 2013-06-09 NOTE — ED Notes (Signed)
I&D tray, 11 blade, and lidocaine at bedside. EDP aware.

## 2013-06-09 NOTE — Discharge Instructions (Signed)
Take the antibiotic until gone. Soak in a tub of warm water for 30 minutes several times a day. The packing can be removed in the tub or shower in about 2 days, if it comes out sooner that is fine. Recheck if it gets worse instead of better.     Abscess An abscess is an infected area that contains a collection of pus and debris.It can occur in almost any part of the body. An abscess is also known as a furuncle or boil. CAUSES  An abscess occurs when tissue gets infected. This can occur from blockage of oil or sweat glands, infection of hair follicles, or a minor injury to the skin. As the body tries to fight the infection, pus collects in the area and creates pressure under the skin. This pressure causes pain. People with weakened immune systems have difficulty fighting infections and get certain abscesses more often.  SYMPTOMS Usually an abscess develops on the skin and becomes a painful mass that is red, warm, and tender. If the abscess forms under the skin, you may feel a moveable soft area under the skin. Some abscesses break open (rupture) on their own, but most will continue to get worse without care. The infection can spread deeper into the body and eventually into the bloodstream, causing you to feel ill.  DIAGNOSIS  Your caregiver will take your medical history and perform a physical exam. A sample of fluid may also be taken from the abscess to determine what is causing your infection. TREATMENT  Your caregiver may prescribe antibiotic medicines to fight the infection. However, taking antibiotics alone usually does not cure an abscess. Your caregiver may need to make a small cut (incision) in the abscess to drain the pus. In some cases, gauze is packed into the abscess to reduce pain and to continue draining the area. HOME CARE INSTRUCTIONS   Only take over-the-counter or prescription medicines for pain, discomfort, or fever as directed by your caregiver.  If you were prescribed  antibiotics, take them as directed. Finish them even if you start to feel better.  If gauze is used, follow your caregiver's directions for changing the gauze.  To avoid spreading the infection:  Keep your draining abscess covered with a bandage.  Wash your hands well.  Do not share personal care items, towels, or whirlpools with others.  Avoid skin contact with others.  Keep your skin and clothes clean around the abscess.  Keep all follow-up appointments as directed by your caregiver. SEEK MEDICAL CARE IF:   You have increased pain, swelling, redness, fluid drainage, or bleeding.  You have muscle aches, chills, or a general ill feeling.  You have a fever. MAKE SURE YOU:   Understand these instructions.  Will watch your condition.  Will get help right away if you are not doing well or get worse. Document Released: 11/29/2004 Document Revised: 08/21/2011 Document Reviewed: 05/04/2011 [facility] LtdExit[facility] Patient Information 2014 [place], [place].

## 2013-06-09 NOTE — ED Provider Notes (Signed)
THIS IS A SHARED VISIT WITH DR. I. KNAPP  Benjamin Vaughn is a 43 y.o. male who presents to the ED with an abscess to the right buttocks.   INCISION AND DRAINAGE Performed by: Jovonna Nickell Consent: Verbal consent obtained. Risks and benefits: risks, benefits and alternatives were discussed Type: abscess  Body area: right buttock  Anesthesia: local infiltration  Draped and cleaned area with betadine.   Local anesthetic: lidocaine 1% with epinephrine  Anesthetic total: 3 ml  Incision made with # 11 blade   Complexity: complex Blunt dissection to break up loculations  Drainage: purulent  Drainage amount: small  Irrigated with NSS  Packing material: 1/4 in iodoform gauze  Patient tolerance: Patient tolerated the procedure well with no immediate complications.     [address]Hope Bertsch-OceanviewM Aleria Vaughn, TexasNP 06/09/13 [phone]

## 2013-06-09 NOTE — ED Notes (Signed)
Pt c/o abscess to the rt buttocks x 3-4days.

## 2013-08-22 ENCOUNTER — Encounter (HOSPITAL_COMMUNITY): Payer: Self-pay | Admitting: Emergency Medicine

## 2013-08-22 ENCOUNTER — Inpatient Hospital Stay (HOSPITAL_COMMUNITY): Payer: BC Managed Care – PPO

## 2013-08-22 ENCOUNTER — Emergency Department (HOSPITAL_COMMUNITY): Payer: BC Managed Care – PPO

## 2013-08-22 ENCOUNTER — Inpatient Hospital Stay (HOSPITAL_COMMUNITY)
Admission: EM | Admit: 2013-08-22 | Discharge: 2013-08-23 | DRG: 683 | Disposition: A | Discharge status: Home / Self Care | Payer: BC Managed Care – PPO | Attending: Family Medicine | Admitting: Family Medicine

## 2013-08-22 DIAGNOSIS — N179 Acute kidney failure, unspecified: Principal | ICD-10-CM | POA: Diagnosis present

## 2013-08-22 DIAGNOSIS — Z9181 History of falling: Secondary | ICD-10-CM

## 2013-08-22 DIAGNOSIS — M25519 Pain in unspecified shoulder: Secondary | ICD-10-CM | POA: Diagnosis present

## 2013-08-22 DIAGNOSIS — M79609 Pain in unspecified limb: Secondary | ICD-10-CM | POA: Diagnosis present

## 2013-08-22 DIAGNOSIS — Y92009 Unspecified place in unspecified non-institutional (private) residence as the place of occurrence of the external cause: Secondary | ICD-10-CM

## 2013-08-22 DIAGNOSIS — M25539 Pain in unspecified wrist: Secondary | ICD-10-CM | POA: Diagnosis present

## 2013-08-22 DIAGNOSIS — N19 Unspecified kidney failure: Secondary | ICD-10-CM | POA: Diagnosis present

## 2013-08-22 DIAGNOSIS — E871 Hypo-osmolality and hyponatremia: Secondary | ICD-10-CM | POA: Diagnosis present

## 2013-08-22 DIAGNOSIS — W132XXA Fall from, out of or through roof, initial encounter: Secondary | ICD-10-CM

## 2013-08-22 DIAGNOSIS — D72829 Elevated white blood cell count, unspecified: Secondary | ICD-10-CM

## 2013-08-22 DIAGNOSIS — G8929 Other chronic pain: Secondary | ICD-10-CM | POA: Diagnosis present

## 2013-08-22 DIAGNOSIS — W138XXA Fall from, out of or through other building or structure, initial encounter: Secondary | ICD-10-CM | POA: Diagnosis present

## 2013-08-22 DIAGNOSIS — Z87891 Personal history of nicotine dependence: Secondary | ICD-10-CM

## 2013-08-22 DIAGNOSIS — I10 Essential (primary) hypertension: Secondary | ICD-10-CM | POA: Diagnosis present

## 2013-08-22 DIAGNOSIS — E860 Dehydration: Secondary | ICD-10-CM

## 2013-08-22 LAB — BASIC METABOLIC PANEL
BUN: 36 mg/dL — AB (ref 6–23)
BUN: 28 mg/dL — ABNORMAL HIGH (ref 6–23)
CALCIUM: 9.2 mg/dL (ref 8.4–10.5)
CO2: 21 meq/L (ref 19–32)
CO2: 26 mEq/L (ref 19–32)
Calcium: 8.2 mg/dL — ABNORMAL LOW (ref 8.4–10.5)
Chloride: 94 mEq/L — ABNORMAL LOW (ref 96–112)
Chloride: 106 mEq/L (ref 96–112)
Creatinine, Ser: 4.21 mg/dL — ABNORMAL HIGH (ref 0.50–1.35)
Creatinine, Ser: 2.67 mg/dL — ABNORMAL HIGH (ref 0.50–1.35)
GFR calc Af Amer: 18 mL/min — ABNORMAL LOW (ref >90)
GFR calc Af Amer: 32 mL/min — ABNORMAL LOW (ref >90)
GFR calc non Af Amer: 27 mL/min — ABNORMAL LOW (ref >90)
GFR, EST NON AFRICAN AMERICAN: 16 mL/min — AB (ref >90)
GLUCOSE: 120 mg/dL — AB (ref 70–99)
Glucose, Bld: 79 mg/dL (ref 70–99)
Potassium: 4.1 mEq/L (ref 3.7–5.3)
Potassium: 4.7 mEq/L (ref 3.7–5.3)
Sodium: 135 mEq/L — ABNORMAL LOW (ref 137–147)
Sodium: 142 mEq/L (ref 137–147)

## 2013-08-22 LAB — URINALYSIS, ROUTINE W REFLEX MICROSCOPIC
Bilirubin Urine: NEGATIVE
GLUCOSE, UA: NEGATIVE mg/dL
HGB URINE DIPSTICK: NEGATIVE
Ketones, ur: NEGATIVE mg/dL
Leukocytes, UA: NEGATIVE
Nitrite: NEGATIVE
PROTEIN: NEGATIVE mg/dL
Specific Gravity, Urine: 1.01 (ref 1.005–1.030)
UROBILINOGEN UA: 0.2 mg/dL (ref 0.0–1.0)
pH: 5.5 (ref 5.0–8.0)

## 2013-08-22 LAB — CBC WITH DIFFERENTIAL/PLATELET
Basophils Absolute: 0 10*3/uL (ref 0.0–0.1)
Basophils Relative: 0 % (ref 0–1)
EOS PCT: 4 % (ref 0–5)
Eosinophils Absolute: 0.4 10*3/uL (ref 0.0–0.7)
HEMATOCRIT: 40.7 % (ref 39.0–52.0)
HEMOGLOBIN: 13.9 g/dL (ref 13.0–17.0)
LYMPHS ABS: 2.7 10*3/uL (ref 0.7–4.0)
Lymphocytes Relative: 25 % (ref 12–46)
MCH: 30.4 pg (ref 26.0–34.0)
MCHC: 34.2 g/dL (ref 30.0–36.0)
MCV: 89.1 fL (ref 78.0–100.0)
MONOS PCT: 11 % (ref 3–12)
Monocytes Absolute: 1.2 10*3/uL — ABNORMAL HIGH (ref 0.1–1.0)
Neutro Abs: 6.5 10*3/uL (ref 1.7–7.7)
Neutrophils Relative %: 60 % (ref 43–77)
PLATELETS: 386 10*3/uL (ref 150–400)
RBC: 4.57 MIL/uL (ref 4.22–5.81)
RDW: 13.6 % (ref 11.5–15.5)
WBC: 10.8 10*3/uL — ABNORMAL HIGH (ref 4.0–10.5)

## 2013-08-22 LAB — OSMOLALITY: Osmolality: 286 mOsm/kg (ref 275–300)

## 2013-08-22 LAB — MRSA PCR SCREENING: MRSA by PCR: POSITIVE — AB

## 2013-08-22 MED ORDER — SODIUM CHLORIDE 0.9 % IV SOLN
Freq: Once | INTRAVENOUS | Status: AC
  Administered 2013-08-22: 13:00:00 via INTRAVENOUS

## 2013-08-22 MED ORDER — MUPIROCIN 2 % EX OINT
1.0000 "application " | TOPICAL_OINTMENT | Freq: Two times a day (BID) | CUTANEOUS | Status: DC
  Administered 2013-08-22 – 2013-08-23 (×2): 1 via NASAL
  Filled 2013-08-22: qty 22

## 2013-08-22 MED ORDER — SODIUM CHLORIDE 0.9 % IV SOLN
Freq: Once | INTRAVENOUS | Status: AC
  Administered 2013-08-22: 12:00:00 via INTRAVENOUS

## 2013-08-22 MED ORDER — IPRATROPIUM-ALBUTEROL 0.5-2.5 (3) MG/3ML IN SOLN
3.0000 mL | RESPIRATORY_TRACT | Status: DC | PRN
  Administered 2013-08-23: 3 mL via RESPIRATORY_TRACT
  Filled 2013-08-22: qty 3

## 2013-08-22 MED ORDER — OXYCODONE-ACETAMINOPHEN 5-325 MG PO TABS
1.0000 | ORAL_TABLET | ORAL | Status: DC | PRN
  Administered 2013-08-22 – 2013-08-23 (×4): 1 via ORAL
  Filled 2013-08-22 (×3): qty 1

## 2013-08-22 MED ORDER — CHLORHEXIDINE GLUCONATE CLOTH 2 % EX PADS: 6.0000 | MEDICATED_PAD | Freq: Every day | CUTANEOUS | Status: DC

## 2013-08-22 MED ORDER — OXYCODONE-ACETAMINOPHEN 5-325 MG PO TABS
1.0000 | ORAL_TABLET | Freq: Three times a day (TID) | ORAL | Status: DC
  Administered 2013-08-22: 1 via ORAL
  Filled 2013-08-22: qty 1

## 2013-08-22 MED ORDER — ADULT MULTIVITAMIN W/MINERALS CH
1.0000 | ORAL_TABLET | Freq: Every day | ORAL | Status: DC
  Administered 2013-08-22 – 2013-08-23 (×2): 1 via ORAL
  Filled 2013-08-22 (×2): qty 1

## 2013-08-22 MED ORDER — OXYCODONE HCL 5 MG PO TABS
5.0000 mg | ORAL_TABLET | ORAL | Status: DC | PRN
  Administered 2013-08-22 – 2013-08-23 (×4): 5 mg via ORAL
  Filled 2013-08-22 (×4): qty 1

## 2013-08-22 MED ORDER — SODIUM CHLORIDE 0.9 % IV SOLN: INTRAVENOUS | Status: AC

## 2013-08-22 MED ORDER — ASPIRIN EC 81 MG PO TBEC
81.0000 mg | DELAYED_RELEASE_TABLET | Freq: Every day | ORAL | Status: DC
  Administered 2013-08-22 – 2013-08-23 (×2): 81 mg via ORAL
  Filled 2013-08-22 (×2): qty 1

## 2013-08-22 MED ORDER — LORATADINE 10 MG PO TABS
10.0000 mg | ORAL_TABLET | Freq: Every day | ORAL | Status: DC
  Administered 2013-08-22 – 2013-08-23 (×2): 10 mg via ORAL
  Filled 2013-08-22 (×2): qty 1

## 2013-08-22 MED ORDER — SODIUM CHLORIDE 0.9 % IV SOLN
Freq: Once | INTRAVENOUS | Status: AC
  Administered 2013-08-22: 10:00:00 via INTRAVENOUS

## 2013-08-22 MED ORDER — DIPHENHYDRAMINE HCL 50 MG/ML IJ SOLN: 25.0000 mg | Freq: Four times a day (QID) | INTRAMUSCULAR | Status: DC | PRN

## 2013-08-22 MED ORDER — SODIUM CHLORIDE 0.9 % IV SOLN
INTRAVENOUS | Status: DC
  Administered 2013-08-22: 23:00:00 via INTRAVENOUS

## 2013-08-22 MED ORDER — DIPHENHYDRAMINE HCL 25 MG PO CAPS
25.0000 mg | ORAL_CAPSULE | Freq: Four times a day (QID) | ORAL | Status: DC | PRN
  Administered 2013-08-22: 25 mg via ORAL
  Filled 2013-08-22: qty 1

## 2013-08-22 MED ORDER — MORPHINE SULFATE 2 MG/ML IJ SOLN
2.0000 mg | INTRAMUSCULAR | Status: DC | PRN
  Administered 2013-08-22: 2 mg via INTRAVENOUS
  Filled 2013-08-22 (×3): qty 1

## 2013-08-22 NOTE — ED Provider Notes (Signed)
Medical screening examination/treatment/procedure(s) were conducted as a shared visit with non-physician practitioner(s) and myself.  I personally evaluated the patient during the encounter.   EKG Interpretation   Date/Time:  Saturday August 22 2013 10:21:14 EDT Ventricular Rate:  79 PR Interval:  138 QRS Duration: 105 QT Interval:  382 QTC Calculation: 438 R Axis:   86 Text Interpretation:  Sinus rhythm RSR' in V1 or V2, right VCD or RVH  Confirmed by COOK  MD, BRIAN ([id number]) on 08/22/2013 10:43:33 AM     CRITICAL CARE Performed by: Donnetta HutchingOOK,BRIAN Total critical care time: 30 Critical care time was exclusive of separately billable procedures and treating other patients. Critical care was necessary to treat or prevent imminent or life-threatening deterioration. Critical care was time spent personally by me on the following activities: development of treatment plan with patient and/or surrogate as well as nursing, discussions with consultants, evaluation of patient's response to treatment, examination of patient, obtaining history from patient or surrogate, ordering and performing treatments and interventions, ordering and review of laboratory studies, ordering and review of radiographic studies, pulse oximetry and re-evaluation of patient's condition.   Patient is hypotensive and dehydrated. Labs reveal a creatinine greater than 4.   Major cause of renal failure is dehydration in this case.  Will admit to investigate other etiologies.  Donnetta HutchingBrian Cook, MD 08/22/13 [phone]

## 2013-08-22 NOTE — Progress Notes (Signed)
Patient able to void at this time.  Performed post void bladder scan found .
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
## 2013-08-23 LAB — BASIC METABOLIC PANEL
BUN: 20 mg/dL (ref 6–23)
CALCIUM: 8.2 mg/dL — AB (ref 8.4–10.5)
CO2: 27 meq/L (ref 19–32)
Chloride: 106 mEq/L (ref 96–112)
Creatinine, Ser: 1.14 mg/dL (ref 0.50–1.35)
GFR calc Af Amer: 88 mL/min — ABNORMAL LOW (ref >90)
GFR, EST NON AFRICAN AMERICAN: 76 mL/min — AB (ref >90)
Glucose, Bld: 92 mg/dL (ref 70–99)
Potassium: 4.6 mEq/L (ref 3.7–5.3)
SODIUM: 141 meq/L (ref 137–147)

## 2013-08-23 LAB — CBC
HCT: 34.9 % — ABNORMAL LOW (ref 39.0–52.0)
HEMOGLOBIN: 11.6 g/dL — AB (ref 13.0–17.0)
MCH: 30.4 pg (ref 26.0–34.0)
MCHC: 33.2 g/dL (ref 30.0–36.0)
MCV: 91.4 fL (ref 78.0–100.0)
PLATELETS: 252 10*3/uL (ref 150–400)
RBC: 3.82 MIL/uL — ABNORMAL LOW (ref 4.22–5.81)
RDW: 13.8 % (ref 11.5–15.5)
WBC: 7.3 10*3/uL (ref 4.0–10.5)

## 2013-08-23 NOTE — Discharge Summary (Signed)
Physician Discharge Summary  Patient ID: Benjamin Vaughn MRN: [id number] DOB/AGE: 45/09/1970 45 y.o. Primary Care Physician:DONDIEGO,RICHARD M, MD Admit date: 08/22/2013 Discharge date: 08/23/2013    Discharge Diagnoses:  1. Acute renal failure secondary to dehydration, resolved.     Medication List    STOP taking these medications       naproxen 500 MG tablet  Commonly known as:  NAPROSYN      TAKE these medications       aspirin EC 81 MG tablet  Take 81 mg by mouth daily.     B COMPLEX PO  Take 1 tablet by mouth daily.     gabapentin 300 MG capsule  Commonly known as:  NEURONTIN  Take 300 mg by mouth 2 (two) times daily.     lisinopril-hydrochlorothiazide 20-12.5 MG per tablet  Commonly known as:  PRINZIDE,ZESTORETIC  Take 1 tablet by mouth daily.     loratadine 10 MG tablet  Commonly known as:  CLARITIN  Take 10 mg by mouth daily.     multivitamins ther. w/minerals Tabs tablet  Take 1 tablet by mouth daily.     oxyCODONE-acetaminophen 7.5-325 MG per tablet  Commonly known as:  PERCOCET  Take 1 tablet by mouth 3 (three) times daily.     Potassium 99 MG Tabs  Take 1 tablet by mouth daily.        Discharged Condition: Stable and improved.    Consults: None.  Significant Diagnostic Studies: Dg Ribs Unilateral W/chest Left  08/22/2013   CLINICAL DATA:  Larey SeatFell from roof this morning, pain at entire LEFT chest and ribs, history hypertension  EXAM: LEFT RIBS AND CHEST - 3+ VIEW  COMPARISON:  Chest radiograph 05/12/2013  FINDINGS: Normal heart size, mediastinal contours, and pulmonary vascularity.  Emphysematous and minimal bronchitic changes consistent with COPD.  No acute infiltrate, pleural effusion or pneumothorax.  Bones appear diffusely demineralized.  Subtle old deformity at the anterior aspect of the LEFT tenth rib compatible with remote fracture.  No definite acute fracture or dislocation identified.  IMPRESSION: COPD changes.  Old fracture anterior LEFT  tenth rib.  No definite acute osseous abnormalities.   Electronically Signed   By: Ulyses SouthwardMark  Boles M.D.   On: 08/22/2013 11:19   Dg Elbow Complete Left  08/22/2013   CLINICAL DATA:  Fall, elbow pain  EXAM: LEFT ELBOW - COMPLETE 3+ VIEW  COMPARISON:  None.  FINDINGS: There is no evidence of fracture, dislocation, or joint effusion. There is no evidence of arthropathy or other focal bone abnormality. Soft tissues are unremarkable.  IMPRESSION: Negative.   Electronically Signed   By: Esperanza Heiraymond  Rubner M.D.   On: 08/22/2013 11:18   Ct Cervical Spine Wo Contrast  08/22/2013   CLINICAL DATA:  Fall from roof with left arm numbness  EXAM: CT CERVICAL SPINE WITHOUT CONTRAST  TECHNIQUE: Multidetector CT imaging of the cervical spine was performed without intravenous contrast. Multiplanar CT image reconstructions were also generated.  COMPARISON:  Cervical spine MRI 11/20/2011  FINDINGS: No acute fracture. Unchanged cervical alignment, including mild relative widening of the anterior C6-7 disc. No traumatic subluxation. There is no prevertebral edema or gross cervical canal hematoma. 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 No gross perispinal fluid collection to suggest pseudomeningocele.  Focally advanced degenerative disc narrowing at C5-6 with posterior osteophytic ridging and dorsal ligamentous buckling circumferentially effacing the CSF, without evidence of cord compression. Uncovertebral spurs and disc narrowing narrows both foramina. These changes  are stable from 2013.  IMPRESSION: 1. No evidence of acute cervical spine injury. 2. 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   Electronically Signed   By: Tiburcio Pea M.D.   On: 08/22/2013 11:16   US Renal  08/22/2013   CLINICAL DATA:  Acute kidney injury Acute kidney injury  EXAM: RENAL/URINARY TRACT ULTRASOUND COMPLETE  COMPARISON:  None.  FINDINGS: Right Kidney:  Length: 10.3 cm. Echogenicity  within normal limits. No mass or hydronephrosis visualized. Small echogenic foci within the medullary portion the kidneys possibly representing small nonobstructing calculi. Largest measures approximately 3 mm.  Left Kidney:  Length: 10.6 cm. Echogenicity within normal limits. No mass or hydronephrosis visualized. Small echogenic foci within the medullary portion the kidney possibly representing small calculi. These areas measure approximately 2 mm.  Bladder:  Prevoid bladder volume is 800 ml. The patient was unable to void. Bilateral ureteral jets.  IMPRESSION: Likely nonobstructing bilateral renal calculi otherwise unremarkable renal ultrasound. Pre void bladder volume 800 ml. Patient unable to void.   Electronically Signed   By: Salome Holmes M.D.   On: 08/22/2013 16:16   Dg Shoulder Left  08/22/2013   CLINICAL DATA:  Larey Seat from roof with left shoulder pain  EXAM: LEFT SHOULDER - 2+ VIEW  COMPARISON:  None.  FINDINGS: There is no evidence of fracture or dislocation. There is no evidence of arthropathy or other focal bone abnormality. Soft tissues are unremarkable.  IMPRESSION: Negative.   Electronically Signed   By: Esperanza Heir M.D.   On: 08/22/2013 11:23   Dg Hand Complete Left  08/22/2013   CLINICAL DATA:  Larey Seat from roof, left hand pain and numbness  EXAM: LEFT HAND - COMPLETE 3+ VIEW  COMPARISON:  None.  FINDINGS: There is no evidence of fracture or dislocation. There is no evidence of arthropathy or other focal bone abnormality. Soft tissues are unremarkable.  IMPRESSION: Negative.   Electronically Signed   By: Esperanza Heir M.D.   On: 08/22/2013 11:26    Lab Results: Basic Metabolic Panel:  Recent Labs  16/10/96 1606 08/23/13 0552  NA 142 141  K 4.7 4.6  CL 106 106  CO2 26 27  GLUCOSE 79 92  BUN 28* 20  CREATININE 2.67* 1.14  CALCIUM 8.2* 8.2*   Liver Function Tests: No results found for this basename: AST, ALT, ALKPHOS, BILITOT, PROT, ALBUMIN,  in the last 72  hours   CBC:  Recent Labs  08/22/13 1019 08/23/13 0552  WBC 10.8* 7.3  NEUTROABS 6.5  --   HGB 13.9 11.6*  HCT 40.7 34.9*  MCV 89.1 91.4  PLT 386 252    Recent Results (from the past 240 hour(s))  MRSA PCR SCREENING     Status: Abnormal   Collection Time    08/22/13  2:53 PM      Result Value Ref Range Status   MRSA by PCR POSITIVE (*) NEGATIVE Final   Comment:            The GeneXpert MRSA Assay (FDA     approved for NASAL specimens     only), is one component of a     comprehensive MRSA colonization     surveillance program. It is not     intended to diagnose MRSA     infection nor to guide or     monitor treatment for     MRSA infections.     RESULT CALLED TO, READ BACK BY AND  VERIFIED WITH:     BUCKNER,J AT 1641 BY GODFREY,O ON 08/22/13     Hospital Course: This is a 43 year old man who presented to the hospital with symptoms of dehydration. He apparently has had a fall. Please see history initially as below: HPI: Tillman SersDallas R Dayton ScrapeMurray is a 43 y.o. male  With a history of hypertension, who presented to the emergency department after falling. It appears that patient was painting of, when he slipped and fell approximately 10-15 feet. Patient states he landed on air conditioning unit on his left side. He complains of left shoulder, hand, wrist pain. Patient also complained of neck pain. Patient denied loss of consciousness, dizziness. Patient states over the last few days he had not been feeling well and did have some nausea and vomiting. Patient states he has been working in the sun and outside recently, he has not been drinking enough fluid. He does state he drinks Gatorade unable to. Patient has noted his urine has been dark and he has not been voiding normally. Currently patient denies any nausea or vomiting, chest pain, shortness of breath, dizziness, headache. He was treated overnight with intravenous fluids. He feels much improved and his creatinine is back down to normal  now. When he presented his creatinine was 4.21. He is stable for discharge. Fortunately, he did not have any fractures. Discharge Exam: Blood pressure 135/53, pulse 66, temperature 98.2 F (36.8 C), temperature source Oral, resp. rate 20, height 5\' 11"  (1.803 m), weight 68 kg (149 lb 14.6 oz), SpO2 100.00%. He looks systemically well. He is hydrated. Heart sounds are present without murmurs. Lung fields are clear. He is alert and orientated without any focal neurological signs.  Disposition: Home. He will follow with his primary care physician, he has an appointment later on this week.      SignedWilson Singer: GOSRANI,NIMISH C   08/23/2013, 8:38 AM

## 2013-08-24 NOTE — Care Management Utilization Note (Signed)
UR completed 

## 2013-08-26 NOTE — Progress Notes (Signed)
Late entry:  08/23/2013 AVS reviewed with patient.  Verbalized understanding of discharge instructions, physician follow-up and medications.  IV removed.  Site WNL.  Patient transported by NT via w/c to main entrance for discharge.  Patient stable at time of discharge.

## 2013-09-21 ENCOUNTER — Encounter (HOSPITAL_COMMUNITY): Payer: Self-pay | Admitting: Emergency Medicine

## 2013-09-21 ENCOUNTER — Emergency Department (HOSPITAL_COMMUNITY)
Admission: EM | Admit: 2013-09-21 | Discharge: 2013-09-22 | Disposition: A | Discharge status: Home / Self Care | Payer: BC Managed Care – PPO | Attending: Emergency Medicine | Admitting: Emergency Medicine

## 2013-09-21 DIAGNOSIS — Z87828 Personal history of other (healed) physical injury and trauma: Secondary | ICD-10-CM | POA: Insufficient documentation

## 2013-09-21 DIAGNOSIS — N289 Disorder of kidney and ureter, unspecified: Secondary | ICD-10-CM | POA: Insufficient documentation

## 2013-09-21 DIAGNOSIS — IMO0002 Reserved for concepts with insufficient information to code with codable children: Secondary | ICD-10-CM | POA: Insufficient documentation

## 2013-09-21 DIAGNOSIS — Y929 Unspecified place or not applicable: Secondary | ICD-10-CM | POA: Insufficient documentation

## 2013-09-21 DIAGNOSIS — I10 Essential (primary) hypertension: Secondary | ICD-10-CM | POA: Insufficient documentation

## 2013-09-21 DIAGNOSIS — R0789 Other chest pain: Secondary | ICD-10-CM

## 2013-09-21 DIAGNOSIS — Z7982 Long term (current) use of aspirin: Secondary | ICD-10-CM | POA: Insufficient documentation

## 2013-09-21 DIAGNOSIS — Z8781 Personal history of (healed) traumatic fracture: Secondary | ICD-10-CM | POA: Insufficient documentation

## 2013-09-21 DIAGNOSIS — Z87891 Personal history of nicotine dependence: Secondary | ICD-10-CM | POA: Insufficient documentation

## 2013-09-21 DIAGNOSIS — M25512 Pain in left shoulder: Secondary | ICD-10-CM

## 2013-09-21 DIAGNOSIS — S298XXA Other specified injuries of thorax, initial encounter: Secondary | ICD-10-CM | POA: Insufficient documentation

## 2013-09-21 DIAGNOSIS — Y939 Activity, unspecified: Secondary | ICD-10-CM | POA: Insufficient documentation

## 2013-09-21 DIAGNOSIS — Z79899 Other long term (current) drug therapy: Secondary | ICD-10-CM | POA: Insufficient documentation

## 2013-09-21 DIAGNOSIS — S4980XA Other specified injuries of shoulder and upper arm, unspecified arm, initial encounter: Secondary | ICD-10-CM | POA: Insufficient documentation

## 2013-09-21 DIAGNOSIS — S46909A Unspecified injury of unspecified muscle, fascia and tendon at shoulder and upper arm level, unspecified arm, initial encounter: Secondary | ICD-10-CM | POA: Insufficient documentation

## 2013-09-21 DIAGNOSIS — R296 Repeated falls: Secondary | ICD-10-CM | POA: Insufficient documentation

## 2013-09-21 NOTE — ED Notes (Signed)
Pt c/o left flank, rib and shoulder pain after fall off roof last week.
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
## 2013-09-22 ENCOUNTER — Emergency Department (HOSPITAL_COMMUNITY): Payer: BC Managed Care – PPO

## 2013-09-22 LAB — COMPREHENSIVE METABOLIC PANEL
ALBUMIN: 3.9 g/dL (ref 3.5–5.2)
ALT: 34 U/L (ref 0–53)
AST: 34 U/L (ref 0–37)
Alkaline Phosphatase: 82 U/L (ref 39–117)
Anion gap: 8 (ref 5–15)
BILIRUBIN TOTAL: 0.2 mg/dL — AB (ref 0.3–1.2)
BUN: 17 mg/dL (ref 6–23)
CHLORIDE: 95 meq/L — AB (ref 96–112)
CO2: 32 mEq/L (ref 19–32)
Calcium: 8.5 mg/dL (ref 8.4–10.5)
Creatinine, Ser: 1.46 mg/dL — ABNORMAL HIGH (ref 0.50–1.35)
GFR calc Af Amer: 65 mL/min — ABNORMAL LOW (ref >90)
GFR calc non Af Amer: 56 mL/min — ABNORMAL LOW (ref >90)
Glucose, Bld: 102 mg/dL — ABNORMAL HIGH (ref 70–99)
POTASSIUM: 4.6 meq/L (ref 3.7–5.3)
SODIUM: 135 meq/L — AB (ref 137–147)
Total Protein: 6.9 g/dL (ref 6.0–8.3)

## 2013-09-22 LAB — CBC WITH DIFFERENTIAL/PLATELET
BASOS ABS: 0 10*3/uL (ref 0.0–0.1)
BASOS PCT: 0 % (ref 0–1)
Eosinophils Absolute: 0.3 10*3/uL (ref 0.0–0.7)
Eosinophils Relative: 4 % (ref 0–5)
HCT: 35.7 % — ABNORMAL LOW (ref 39.0–52.0)
Hemoglobin: 12 g/dL — ABNORMAL LOW (ref 13.0–17.0)
Lymphocytes Relative: 27 % (ref 12–46)
Lymphs Abs: 2 10*3/uL (ref 0.7–4.0)
MCH: 30.3 pg (ref 26.0–34.0)
MCHC: 33.6 g/dL (ref 30.0–36.0)
MCV: 90.2 fL (ref 78.0–100.0)
Monocytes Absolute: 0.9 10*3/uL (ref 0.1–1.0)
Monocytes Relative: 11 % (ref 3–12)
NEUTROS ABS: 4.4 10*3/uL (ref 1.7–7.7)
NEUTROS PCT: 58 % (ref 43–77)
PLATELETS: 212 10*3/uL (ref 150–400)
RBC: 3.96 MIL/uL — ABNORMAL LOW (ref 4.22–5.81)
RDW: 12.8 % (ref 11.5–15.5)
WBC: 7.6 10*3/uL (ref 4.0–10.5)

## 2013-09-22 LAB — URINALYSIS, ROUTINE W REFLEX MICROSCOPIC
Bilirubin Urine: NEGATIVE
GLUCOSE, UA: NEGATIVE mg/dL
Hgb urine dipstick: NEGATIVE
Ketones, ur: NEGATIVE mg/dL
LEUKOCYTES UA: NEGATIVE
Nitrite: NEGATIVE
PH: 6 (ref 5.0–8.0)
Protein, ur: NEGATIVE mg/dL
Specific Gravity, Urine: <1.005 — ABNORMAL LOW (ref 1.005–1.030)
Urobilinogen, UA: 0.2 mg/dL (ref 0.0–1.0)

## 2013-09-22 MED ORDER — SODIUM CHLORIDE 0.9 % IV SOLN: 1000.0000 mL | INTRAVENOUS | Status: DC

## 2013-09-22 MED ORDER — SODIUM CHLORIDE 0.9 % IV SOLN
1000.0000 mL | Freq: Once | INTRAVENOUS | Status: AC
  Administered 2013-09-22: 1000 mL via INTRAVENOUS

## 2013-09-22 MED ORDER — OXYCODONE-ACETAMINOPHEN 5-325 MG PO TABS: 1.0000 | ORAL_TABLET | ORAL | Status: DC | PRN

## 2013-09-22 NOTE — Discharge Instructions (Signed)
Your x-rays do not show any evidence of any broken bones. However, blood test to show that your kidneys are working as well as they did when he left the hospital. Please do not take ibuprofen or naproxen because they may make your kidneys worse. Take acetaminophen as needed for less severe pain. Her being given some oxycodone-acetaminophen to take home with you. You may take these for more severe pain. Followup with your PCP this week.

## 2013-09-24 DIAGNOSIS — Z87828 Personal history of other (healed) physical injury and trauma: Secondary | ICD-10-CM | POA: Insufficient documentation

## 2013-09-24 DIAGNOSIS — Z79899 Other long term (current) drug therapy: Secondary | ICD-10-CM | POA: Insufficient documentation

## 2013-09-24 DIAGNOSIS — R1012 Left upper quadrant pain: Secondary | ICD-10-CM | POA: Insufficient documentation

## 2013-09-24 DIAGNOSIS — I10 Essential (primary) hypertension: Secondary | ICD-10-CM | POA: Insufficient documentation

## 2013-09-24 DIAGNOSIS — Z87448 Personal history of other diseases of urinary system: Secondary | ICD-10-CM | POA: Insufficient documentation

## 2013-09-24 DIAGNOSIS — Z87891 Personal history of nicotine dependence: Secondary | ICD-10-CM | POA: Insufficient documentation

## 2013-09-24 DIAGNOSIS — R112 Nausea with vomiting, unspecified: Secondary | ICD-10-CM | POA: Insufficient documentation

## 2013-09-24 DIAGNOSIS — Z8781 Personal history of (healed) traumatic fracture: Secondary | ICD-10-CM | POA: Insufficient documentation

## 2013-09-24 DIAGNOSIS — M549 Dorsalgia, unspecified: Secondary | ICD-10-CM | POA: Insufficient documentation

## 2013-09-25 ENCOUNTER — Encounter (HOSPITAL_COMMUNITY): Payer: Self-pay | Admitting: Emergency Medicine

## 2013-09-25 ENCOUNTER — Emergency Department (HOSPITAL_COMMUNITY): Payer: BC Managed Care – PPO

## 2013-09-25 ENCOUNTER — Emergency Department (HOSPITAL_COMMUNITY)
Admission: EM | Admit: 2013-09-25 | Discharge: 2013-09-25 | Disposition: A | Discharge status: Home / Self Care | Payer: BC Managed Care – PPO | Attending: Emergency Medicine | Admitting: Emergency Medicine

## 2013-09-25 DIAGNOSIS — R109 Unspecified abdominal pain: Secondary | ICD-10-CM

## 2013-09-25 LAB — CBC WITH DIFFERENTIAL/PLATELET
Basophils Absolute: 0 10*3/uL (ref 0.0–0.1)
Basophils Relative: 0 % (ref 0–1)
Eosinophils Absolute: 0.1 10*3/uL (ref 0.0–0.7)
Eosinophils Relative: 1 % (ref 0–5)
HCT: 44.1 % (ref 39.0–52.0)
Hemoglobin: 14.9 g/dL (ref 13.0–17.0)
LYMPHS ABS: 1.9 10*3/uL (ref 0.7–4.0)
LYMPHS PCT: 15 % (ref 12–46)
MCH: 30.2 pg (ref 26.0–34.0)
MCHC: 33.8 g/dL (ref 30.0–36.0)
MCV: 89.5 fL (ref 78.0–100.0)
MONOS PCT: 8 % (ref 3–12)
Monocytes Absolute: 1 10*3/uL (ref 0.1–1.0)
NEUTROS PCT: 76 % (ref 43–77)
Neutro Abs: 9.7 10*3/uL — ABNORMAL HIGH (ref 1.7–7.7)
Platelets: 296 10*3/uL (ref 150–400)
RBC: 4.93 MIL/uL (ref 4.22–5.81)
RDW: 12.9 % (ref 11.5–15.5)
WBC: 12.7 10*3/uL — ABNORMAL HIGH (ref 4.0–10.5)

## 2013-09-25 LAB — URINALYSIS, ROUTINE W REFLEX MICROSCOPIC
Bilirubin Urine: NEGATIVE
Glucose, UA: NEGATIVE mg/dL
Hgb urine dipstick: NEGATIVE
Ketones, ur: NEGATIVE mg/dL
Leukocytes, UA: NEGATIVE
NITRITE: NEGATIVE
Protein, ur: NEGATIVE mg/dL
SPECIFIC GRAVITY, URINE: 1.008 (ref 1.005–1.030)
Urobilinogen, UA: 0.2 mg/dL (ref 0.0–1.0)
pH: 7.5 (ref 5.0–8.0)

## 2013-09-25 LAB — COMPREHENSIVE METABOLIC PANEL
ALBUMIN: 4.8 g/dL (ref 3.5–5.2)
ALT: 50 U/L (ref 0–53)
ANION GAP: 15 (ref 5–15)
AST: 46 U/L — ABNORMAL HIGH (ref 0–37)
Alkaline Phosphatase: 97 U/L (ref 39–117)
BILIRUBIN TOTAL: 0.7 mg/dL (ref 0.3–1.2)
BUN: 11 mg/dL (ref 6–23)
CO2: 28 mEq/L (ref 19–32)
Calcium: 9.8 mg/dL (ref 8.4–10.5)
Chloride: 94 mEq/L — ABNORMAL LOW (ref 96–112)
Creatinine, Ser: 1.11 mg/dL (ref 0.50–1.35)
GFR calc Af Amer: >90 mL/min (ref >90)
GFR calc non Af Amer: 79 mL/min — ABNORMAL LOW (ref >90)
Glucose, Bld: 115 mg/dL — ABNORMAL HIGH (ref 70–99)
Potassium: 4.3 mEq/L (ref 3.7–5.3)
Sodium: 137 mEq/L (ref 137–147)
Total Protein: 8.9 g/dL — ABNORMAL HIGH (ref 6.0–8.3)

## 2013-09-25 MED ORDER — IOHEXOL 300 MG/ML  SOLN
25.0000 mL | Freq: Once | INTRAMUSCULAR | Status: AC | PRN
  Administered 2013-09-25: 25 mL via ORAL

## 2013-09-25 MED ORDER — METHOCARBAMOL 500 MG PO TABS
1000.0000 mg | ORAL_TABLET | Freq: Once | ORAL | Status: AC
  Administered 2013-09-25: 1000 mg via ORAL
  Filled 2013-09-25: qty 2

## 2013-09-25 MED ORDER — IOHEXOL 300 MG/ML  SOLN
80.0000 mL | Freq: Once | INTRAMUSCULAR | Status: AC | PRN
  Administered 2013-09-25: 80 mL via INTRAVENOUS

## 2013-09-25 MED ORDER — HYDROMORPHONE HCL PF 1 MG/ML IJ SOLN
1.0000 mg | Freq: Once | INTRAMUSCULAR | Status: AC
  Administered 2013-09-25: 1 mg via INTRAVENOUS
  Filled 2013-09-25: qty 1

## 2013-09-25 MED ORDER — METHOCARBAMOL 750 MG PO TABS: 750.0000 mg | ORAL_TABLET | Freq: Four times a day (QID) | ORAL | Status: DC

## 2013-09-25 MED ORDER — ONDANSETRON HCL 4 MG/2ML IJ SOLN
4.0000 mg | Freq: Once | INTRAMUSCULAR | Status: AC
Start: 2013-09-25 — End: 2013-09-25
  Administered 2013-09-25: 4 mg via INTRAVENOUS
  Filled 2013-09-25: qty 2

## 2013-09-25 MED ORDER — LIDOCAINE 5 % EX PTCH
1.0000 | MEDICATED_PATCH | CUTANEOUS | Status: DC
  Administered 2013-09-25: 1 via TRANSDERMAL
  Filled 2013-09-25 (×2): qty 1

## 2013-09-25 MED ORDER — LIDOCAINE 5 % EX PTCH: 1.0000 | MEDICATED_PATCH | CUTANEOUS | Status: DC

## 2013-09-25 MED ORDER — ONDANSETRON 8 MG PO TBDP: 8.0000 mg | ORAL_TABLET | Freq: Three times a day (TID) | ORAL | Status: DC | PRN

## 2013-09-25 NOTE — ED Notes (Signed)
Pt could not void at this time 

## 2013-09-25 NOTE — Discharge Instructions (Signed)
Your CT scan today DID NOT show any damage to your kidneys!  You do not have any internal organ damage, no broken bones.  Your pain is thought to be due to muscle pain in your left side and back.  Take medications as prescribed.  Follow up with your doctor.  You may need referral to physical therapy if you are not improving.   Flank Pain Flank pain refers to pain that is located on the side of the body between the upper abdomen and the back. The pain may occur over a short period of time (acute) or may be long-term or reoccurring (chronic). It may be mild or severe. Flank pain can be caused by many things. CAUSES  Some of the more common causes of flank pain include:  Muscle strains.   Muscle spasms.   A disease of your spine (vertebral disk disease).   A lung infection (pneumonia).   Fluid around your lungs (pulmonary edema).   A kidney infection.   Kidney stones.   A very painful skin rash caused by the chickenpox virus (shingles).   Gallbladder disease.  HOME CARE INSTRUCTIONS  Home care will depend on the cause of your pain. In general,  Rest as directed by your caregiver.  Drink enough fluids to keep your urine clear or pale yellow.  Only take over-the-counter or prescription medicines as directed by your caregiver. Some medicines may help relieve the pain.  Tell your caregiver about any changes in your pain.  Follow up with your caregiver as directed. SEEK IMMEDIATE MEDICAL CARE IF:   Your pain is not controlled with medicine.   You have new or worsening symptoms.  Your pain increases.   You have abdominal pain.   You have shortness of breath.   You have persistent nausea or vomiting.   You have swelling in your abdomen.   You feel faint or pass out.   You have blood in your urine.  You have a fever or persistent symptoms for more than 2-3 days.  You have a fever and your symptoms suddenly get worse. MAKE SURE YOU:   Understand these  instructions.  Will watch your condition.  Will get help right away if you are not doing well or get worse. Document Released: 04/12/2005 Document Revised: 11/14/2011 Document Reviewed: 10/04/2011 [facility] Patient Information 2015 [employer] [place]. This information is not intended to replace advice given to you by your health care provider. Make sure you discuss any questions you have with your health care provider.
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
## 2013-09-25 NOTE — ED Notes (Signed)
CT called, patient finished drinking oral contrast. 

## 2013-09-25 NOTE — ED Notes (Signed)
Pt. reports left flank pain for 2 weeks worse these past several days , denies injury , slight hematuria , no dysuria . Denies fever or chills.

## 2013-09-25 NOTE — ED Notes (Signed)
Patient transported to CT 

## 2013-09-29 ENCOUNTER — Emergency Department (HOSPITAL_COMMUNITY)
Admission: EM | Admit: 2013-09-29 | Discharge: 2013-09-29 | Disposition: A | Discharge status: Home / Self Care | Payer: BC Managed Care – PPO | Attending: Emergency Medicine | Admitting: Emergency Medicine

## 2013-09-29 ENCOUNTER — Encounter (HOSPITAL_COMMUNITY): Payer: Self-pay | Admitting: Emergency Medicine

## 2013-09-29 DIAGNOSIS — J029 Acute pharyngitis, unspecified: Secondary | ICD-10-CM

## 2013-09-29 DIAGNOSIS — I10 Essential (primary) hypertension: Secondary | ICD-10-CM | POA: Insufficient documentation

## 2013-09-29 DIAGNOSIS — Z87828 Personal history of other (healed) physical injury and trauma: Secondary | ICD-10-CM | POA: Insufficient documentation

## 2013-09-29 DIAGNOSIS — Z8781 Personal history of (healed) traumatic fracture: Secondary | ICD-10-CM | POA: Insufficient documentation

## 2013-09-29 DIAGNOSIS — Z79899 Other long term (current) drug therapy: Secondary | ICD-10-CM | POA: Insufficient documentation

## 2013-09-29 DIAGNOSIS — Z87891 Personal history of nicotine dependence: Secondary | ICD-10-CM | POA: Insufficient documentation

## 2013-09-29 LAB — CBC WITH DIFFERENTIAL/PLATELET
Basophils Absolute: 0 10*3/uL (ref 0.0–0.1)
Basophils Relative: 0 % (ref 0–1)
Eosinophils Absolute: 0.4 10*3/uL (ref 0.0–0.7)
Eosinophils Relative: 4 % (ref 0–5)
HCT: 34.3 % — ABNORMAL LOW (ref 39.0–52.0)
HEMOGLOBIN: 11.5 g/dL — AB (ref 13.0–17.0)
Lymphocytes Relative: 23 % (ref 12–46)
Lymphs Abs: 2.1 10*3/uL (ref 0.7–4.0)
MCH: 30.1 pg (ref 26.0–34.0)
MCHC: 33.5 g/dL (ref 30.0–36.0)
MCV: 89.8 fL (ref 78.0–100.0)
MONOS PCT: 9 % (ref 3–12)
Monocytes Absolute: 0.8 10*3/uL (ref 0.1–1.0)
NEUTROS PCT: 64 % (ref 43–77)
Neutro Abs: 6 10*3/uL (ref 1.7–7.7)
Platelets: 198 10*3/uL (ref 150–400)
RBC: 3.82 MIL/uL — ABNORMAL LOW (ref 4.22–5.81)
RDW: 12.7 % (ref 11.5–15.5)
WBC: 9.3 10*3/uL (ref 4.0–10.5)

## 2013-09-29 LAB — BASIC METABOLIC PANEL
Anion gap: 11 (ref 5–15)
BUN: 10 mg/dL (ref 6–23)
CHLORIDE: 98 meq/L (ref 96–112)
CO2: 27 meq/L (ref 19–32)
Calcium: 8.5 mg/dL (ref 8.4–10.5)
Creatinine, Ser: 1.26 mg/dL (ref 0.50–1.35)
GFR calc Af Amer: 78 mL/min — ABNORMAL LOW (ref >90)
GFR calc non Af Amer: 67 mL/min — ABNORMAL LOW (ref >90)
GLUCOSE: 97 mg/dL (ref 70–99)
Potassium: 4 mEq/L (ref 3.7–5.3)
SODIUM: 136 meq/L — AB (ref 137–147)

## 2013-09-29 LAB — RAPID STREP SCREEN (MED CTR MEBANE ONLY): Streptococcus, Group A Screen (Direct): NEGATIVE

## 2013-09-29 MED ORDER — OXYCODONE-ACETAMINOPHEN 5-325 MG PO TABS: 1.0000 | ORAL_TABLET | ORAL | Status: DC | PRN

## 2013-09-29 MED ORDER — TRAMADOL HCL 50 MG PO TABS
50.0000 mg | ORAL_TABLET | Freq: Once | ORAL | Status: DC
  Filled 2013-09-29: qty 1

## 2013-09-29 MED ORDER — DEXAMETHASONE 4 MG PO TABS
12.0000 mg | ORAL_TABLET | ORAL | Status: AC
  Administered 2013-09-29: 12 mg via ORAL
  Filled 2013-09-29: qty 3
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
## 2013-09-29 NOTE — ED Notes (Signed)
Pt c/o mouth burning and throat pain.

## 2013-09-29 NOTE — ED Notes (Signed)
Pt angry that he was given Ultram and a prescription for Percocet rather than being given a take home pack.  Pt states he will be unable to fill script because "my doctor will cut me off then."  Script shredded.

## 2013-09-29 NOTE — Discharge Instructions (Signed)
Sore Throat A sore throat is pain, burning, irritation, or scratchiness of the throat. There is often pain or tenderness when swallowing or talking. A sore throat may be accompanied by other symptoms, such as coughing, sneezing, fever, and swollen neck glands. A sore throat is often the first sign of another sickness, such as a cold, flu, strep throat, or mononucleosis (commonly known as mono). Most sore throats go away without medical treatment. CAUSES  The most common causes of a sore throat include:  A viral infection, such as a cold, flu, or mono.  A bacterial infection, such as strep throat, tonsillitis, or whooping cough.  Seasonal allergies.  Dryness in the air.  Irritants, such as smoke or pollution.  Gastroesophageal reflux disease (GERD). HOME CARE INSTRUCTIONS   Only take over-the-counter medicines as directed by your caregiver.  Drink enough fluids to keep your urine clear or pale yellow.  Rest as needed.  Try using throat sprays, lozenges, or sucking on hard candy to ease any pain (if older than 4 years or as directed).  Sip warm liquids, such as broth, herbal tea, or warm water with honey to relieve pain temporarily. You may also eat or drink cold or frozen liquids such as frozen ice pops.  Gargle with salt water (mix 1 tsp salt with 8 oz of water).  Do not smoke and avoid secondhand smoke.  Put a cool-mist humidifier in your bedroom at night to moisten the air. You can also turn on a hot shower and sit in the bathroom with the door closed for 5-10 minutes. SEEK IMMEDIATE MEDICAL CARE IF:  You have difficulty breathing.  You are unable to swallow fluids, soft foods, or your saliva.  You have increased swelling in the throat.  Your sore throat does not get better in 7 days.  You have nausea and vomiting.  You have a fever or persistent symptoms for more than 2-3 days.  You have a fever and your symptoms suddenly get worse. MAKE SURE YOU:   Understand  these instructions.  Will watch your condition.  Will get help right away if you are not doing well or get worse. Document Released: 03/29/2004 Document Revised: 02/06/2012 Document Reviewed: 10/28/2011 [facility] Patient Information 2015 [place], [place]. This information is not intended to replace advice given to you by your health care provider. Make sure you discuss any questions you have with your health care provider.  Acetaminophen; Oxycodone tablets What is this medicine? ACETAMINOPHEN; OXYCODONE (a set a MEE noe fen; ox i KOE done) is a pain reliever. It is used to treat mild to moderate pain. This medicine may be used for other purposes; ask your health care provider or pharmacist if you have questions. COMMON BRAND NAME(S): Endocet, Magnacet, Narvox, Percocet, Perloxx, Primalev, Primlev, Roxicet, Xolox What should I tell my health care provider before I take this medicine? They need to know if you have any of these conditions: -brain tumor -Crohn's disease, inflammatory bowel disease, or ulcerative colitis -drug abuse or addiction -head injury -heart or circulation problems -if you often drink alcohol -kidney disease or problems going to the bathroom -liver disease -lung disease, asthma, or breathing problems -an unusual or allergic reaction to acetaminophen, oxycodone, other opioid analgesics, other medicines, foods, dyes, or preservatives -pregnant or trying to get pregnant -breast-feeding How should I use this medicine? Take this medicine by mouth with a full glass of water. Follow the directions on the prescription label. Take your medicine at regular intervals. Do not take  your medicine more often than directed. Talk to your pediatrician regarding the use of this medicine in children. Special care may be needed. Patients over 74 years old may have a stronger reaction and need a smaller dose. Overdosage: If you think you have taken too much of this medicine contact a poison  control center or emergency room at once. NOTE: This medicine is only for you. Do not share this medicine with others. What if I miss a dose? If you miss a dose, take it as soon as you can. If it is almost time for your next dose, take only that dose. Do not take double or extra doses. What may interact with this medicine? -alcohol -antihistamines -barbiturates like amobarbital, butalbital, butabarbital, methohexital, pentobarbital, phenobarbital, thiopental, and secobarbital -benztropine -drugs for bladder problems like solifenacin, trospium, oxybutynin, tolterodine, hyoscyamine, and methscopolamine -drugs for breathing problems like ipratropium and tiotropium -drugs for certain stomach or intestine problems like propantheline, homatropine methylbromide, glycopyrrolate, atropine, belladonna, and dicyclomine -general anesthetics like etomidate, ketamine, nitrous oxide, propofol, desflurane, enflurane, halothane, isoflurane, and sevoflurane -medicines for depression, anxiety, or psychotic disturbances -medicines for sleep -muscle relaxants -naltrexone -narcotic medicines (opiates) for pain -phenothiazines like perphenazine, thioridazine, chlorpromazine, mesoridazine, fluphenazine, prochlorperazine, promazine, and trifluoperazine -scopolamine -tramadol -trihexyphenidyl This list may not describe all possible interactions. Give your health care provider a list of all the medicines, herbs, non-prescription drugs, or dietary supplements you use. Also tell them if you smoke, drink alcohol, or use illegal drugs. Some items may interact with your medicine. What should I watch for while using this medicine? Tell your doctor or health care professional if your pain does not go away, if it gets worse, or if you have new or a different type of pain. You may develop tolerance to the medicine. Tolerance means that you will need a higher dose of the medication for pain relief. Tolerance is normal and is  expected if you take this medicine for a long time. Do not suddenly stop taking your medicine because you may develop a severe reaction. Your body becomes used to the medicine. This does NOT mean you are addicted. Addiction is a behavior related to getting and using a drug for a non-medical reason. If you have pain, you have a medical reason to take pain medicine. Your doctor will tell you how much medicine to take. If your doctor wants you to stop the medicine, the dose will be slowly lowered over time to avoid any side effects. You may get drowsy or dizzy. Do not drive, use machinery, or do anything that needs mental alertness until you know how this medicine affects you. Do not stand or sit up quickly, especially if you are an older patient. This reduces the risk of dizzy or fainting spells. Alcohol may interfere with the effect of this medicine. Avoid alcoholic drinks. There are different types of narcotic medicines (opiates) for pain. If you take more than one type at the same time, you may have more side effects. Give your health care provider a list of all medicines you use. Your doctor will tell you how much medicine to take. Do not take more medicine than directed. Call emergency for help if you have problems breathing. The medicine will cause constipation. Try to have a bowel movement at least every 2 to 3 days. If you do not have a bowel movement for 3 days, call your doctor or health care professional. Do not take Tylenol (acetaminophen) or medicines that have acetaminophen with this medicine.  Too much acetaminophen can be very dangerous. Many nonprescription medicines contain acetaminophen. Always read the labels carefully to avoid taking more acetaminophen. What side effects may I notice from receiving this medicine? Side effects that you should report to your doctor or health care professional as soon as possible: -allergic reactions like skin rash, itching or hives, swelling of the face,  lips, or tongue -breathing difficulties, wheezing -confusion -light headedness or fainting spells -severe stomach pain -unusually weak or tired -yellowing of the skin or the whites of the eyes Side effects that usually do not require medical attention (report to your doctor or health care professional if they continue or are bothersome): -dizziness -drowsiness -nausea -vomiting This list may not describe all possible side effects. Call your doctor for medical advice about side effects. You may report side effects to FDA at 1-800-FDA-1088. Where should I keep my medicine? Keep out of the reach of children. This medicine can be abused. Keep your medicine in a safe place to protect it from theft. Do not share this medicine with anyone. Selling or giving away this medicine is dangerous and against the law. Store at room temperature between 20 and 25 degrees C (68 and 77 degrees F). Keep container tightly closed. Protect from light. This medicine may cause accidental overdose and death if it is taken by other adults, children, or pets. Flush any unused medicine down the toilet to reduce the chance of harm. Do not use the medicine after the expiration date. NOTE: This sheet is a summary. It may not cover all possible information. If you have questions about this medicine, talk to your doctor, pharmacist, or health care provider.  2015, [employer]. (2012-10-13 13:17:35)

## 2013-09-30 LAB — CULTURE, GROUP A STREP

## 2013-10-02 MED FILL — Oxycodone w/ Acetaminophen Tab 5-325 MG: ORAL | Qty: 6 | Status: AC

## 2013-10-16 ENCOUNTER — Emergency Department (HOSPITAL_COMMUNITY)
Admission: EM | Admit: 2013-10-16 | Discharge: 2013-10-16 | Disposition: A | Discharge status: Home / Self Care | Payer: BC Managed Care – PPO | Attending: Emergency Medicine | Admitting: Emergency Medicine

## 2013-10-16 ENCOUNTER — Encounter (HOSPITAL_COMMUNITY): Payer: Self-pay | Admitting: Emergency Medicine

## 2013-10-16 DIAGNOSIS — J329 Chronic sinusitis, unspecified: Secondary | ICD-10-CM | POA: Insufficient documentation

## 2013-10-16 DIAGNOSIS — Z87891 Personal history of nicotine dependence: Secondary | ICD-10-CM | POA: Insufficient documentation

## 2013-10-16 DIAGNOSIS — R51 Headache: Secondary | ICD-10-CM | POA: Insufficient documentation

## 2013-10-16 DIAGNOSIS — Z792 Long term (current) use of antibiotics: Secondary | ICD-10-CM | POA: Insufficient documentation

## 2013-10-16 DIAGNOSIS — R059 Cough, unspecified: Secondary | ICD-10-CM | POA: Insufficient documentation

## 2013-10-16 DIAGNOSIS — Z8781 Personal history of (healed) traumatic fracture: Secondary | ICD-10-CM | POA: Insufficient documentation

## 2013-10-16 DIAGNOSIS — I10 Essential (primary) hypertension: Secondary | ICD-10-CM | POA: Diagnosis not present

## 2013-10-16 DIAGNOSIS — IMO0002 Reserved for concepts with insufficient information to code with codable children: Secondary | ICD-10-CM | POA: Diagnosis not present

## 2013-10-16 DIAGNOSIS — J209 Acute bronchitis, unspecified: Secondary | ICD-10-CM | POA: Insufficient documentation

## 2013-10-16 DIAGNOSIS — Z79899 Other long term (current) drug therapy: Secondary | ICD-10-CM | POA: Insufficient documentation

## 2013-10-16 DIAGNOSIS — J019 Acute sinusitis, unspecified: Secondary | ICD-10-CM

## 2013-10-16 DIAGNOSIS — Z87828 Personal history of other (healed) physical injury and trauma: Secondary | ICD-10-CM | POA: Insufficient documentation

## 2013-10-16 DIAGNOSIS — R05 Cough: Secondary | ICD-10-CM | POA: Insufficient documentation

## 2013-10-16 DIAGNOSIS — J40 Bronchitis, not specified as acute or chronic: Secondary | ICD-10-CM

## 2013-10-16 MED ORDER — DOXYCYCLINE HYCLATE 100 MG PO TABS
100.0000 mg | ORAL_TABLET | Freq: Once | ORAL | Status: AC
  Administered 2013-10-16: 100 mg via ORAL
  Filled 2013-10-16: qty 1

## 2013-10-16 MED ORDER — LORATADINE-PSEUDOEPHEDRINE ER 5-120 MG PO TB12: 1.0000 | ORAL_TABLET | Freq: Two times a day (BID) | ORAL | Status: DC

## 2013-10-16 MED ORDER — PSEUDOEPHEDRINE HCL 60 MG PO TABS
60.0000 mg | ORAL_TABLET | Freq: Once | ORAL | Status: AC
  Administered 2013-10-16: 60 mg via ORAL
  Filled 2013-10-16: qty 1

## 2013-10-16 MED ORDER — MINOCYCLINE HCL 100 MG PO CAPS: 100.0000 mg | ORAL_CAPSULE | Freq: Two times a day (BID) | ORAL | Status: DC

## 2013-10-16 MED ORDER — PREDNISONE 10 MG PO TABS: ORAL_TABLET | ORAL | Status: DC

## 2013-10-16 MED ORDER — PREDNISONE 50 MG PO TABS
60.0000 mg | ORAL_TABLET | Freq: Once | ORAL | Status: AC
  Administered 2013-10-16: 60 mg via ORAL
  Filled 2013-10-16 (×2): qty 1

## 2013-10-16 NOTE — ED Provider Notes (Signed)
Medical screening examination/treatment/procedure(s) were performed by non-physician practitioner and as supervising physician I was immediately available for consultation/collaboration.   EKG Interpretation None       Donnetta HutchingBrian Jashley Yellin, MD 10/16/13 [id number]
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
## 2013-10-16 NOTE — Discharge Instructions (Signed)
Please increase fluids. Please use medications as suggested. Please take the minocycline and prednisone daily with food until all taken. Please wash hands frequently. Please see Dr. Delbert Harnesson Diego for followup in one week. Sinusitis Sinusitis is redness, soreness, and puffiness (inflammation) of the air pockets in the bones of your face (sinuses). The redness, soreness, and puffiness can cause air and mucus to get trapped in your sinuses. This can allow germs to grow and cause an infection.  HOME CARE   Drink enough fluids to keep your pee (urine) clear or pale yellow.  Use a humidifier in your home.  Run a hot shower to create steam in the bathroom. Sit in the bathroom with the door closed. Breathe in the steam 3-4 times a day.  Put a warm, moist washcloth on your face 3-4 times a day, or as told by your doctor.  Use salt water sprays (saline sprays) to wet the thick fluid in your nose. This can help the sinuses drain.  Only take medicine as told by your doctor. GET HELP RIGHT AWAY IF:   Your pain gets worse.  You have very bad headaches.  You are sick to your stomach (nauseous).  You throw up (vomit).  You are very sleepy (drowsy) all the time.  Your face is puffy (swollen).  Your vision changes.  You have a stiff neck.  You have trouble breathing. MAKE SURE YOU:   Understand these instructions.  Will watch your condition.  Will get help right away if you are not doing well or get worse. Document Released: 08/08/2007 Document Revised: 11/14/2011 Document Reviewed: 09/25/2011 [facility] Patient Information 2015 [place], [place]. This information is not intended to replace advice given to you by your health care provider. Make sure you discuss any questions you have with your health care provider.

## 2013-12-26 ENCOUNTER — Encounter (HOSPITAL_COMMUNITY): Payer: Self-pay | Admitting: Emergency Medicine

## 2013-12-26 ENCOUNTER — Emergency Department (HOSPITAL_COMMUNITY)
Admission: EM | Admit: 2013-12-26 | Discharge: 2013-12-26 | Disposition: A | Discharge status: Home / Self Care | Payer: BC Managed Care – PPO | Attending: Emergency Medicine | Admitting: Emergency Medicine

## 2013-12-26 DIAGNOSIS — R109 Unspecified abdominal pain: Secondary | ICD-10-CM | POA: Diagnosis present

## 2013-12-26 DIAGNOSIS — Z792 Long term (current) use of antibiotics: Secondary | ICD-10-CM | POA: Insufficient documentation

## 2013-12-26 DIAGNOSIS — Z8781 Personal history of (healed) traumatic fracture: Secondary | ICD-10-CM | POA: Insufficient documentation

## 2013-12-26 DIAGNOSIS — N410 Acute prostatitis: Secondary | ICD-10-CM | POA: Diagnosis not present

## 2013-12-26 DIAGNOSIS — Z79899 Other long term (current) drug therapy: Secondary | ICD-10-CM | POA: Diagnosis not present

## 2013-12-26 DIAGNOSIS — Z87891 Personal history of nicotine dependence: Secondary | ICD-10-CM | POA: Diagnosis not present

## 2013-12-26 DIAGNOSIS — Z87828 Personal history of other (healed) physical injury and trauma: Secondary | ICD-10-CM | POA: Diagnosis not present

## 2013-12-26 DIAGNOSIS — I10 Essential (primary) hypertension: Secondary | ICD-10-CM | POA: Insufficient documentation

## 2013-12-26 LAB — URINALYSIS, ROUTINE W REFLEX MICROSCOPIC
BILIRUBIN URINE: NEGATIVE
GLUCOSE, UA: NEGATIVE mg/dL
HGB URINE DIPSTICK: NEGATIVE
KETONES UR: NEGATIVE mg/dL
Leukocytes, UA: NEGATIVE
Nitrite: NEGATIVE
PH: 6 (ref 5.0–8.0)
Protein, ur: NEGATIVE mg/dL
Urobilinogen, UA: 0.2 mg/dL (ref 0.0–1.0)

## 2013-12-26 MED ORDER — DOXYCYCLINE HYCLATE 100 MG PO CAPS: 100.0000 mg | ORAL_CAPSULE | Freq: Two times a day (BID) | ORAL | Status: DC

## 2013-12-26 MED ORDER — OXYCODONE-ACETAMINOPHEN 5-325 MG PO TABS
1.0000 | ORAL_TABLET | Freq: Once | ORAL | Status: AC
  Administered 2013-12-26: 1 via ORAL
  Filled 2013-12-26: qty 1

## 2013-12-26 NOTE — ED Notes (Signed)
Pt reports he is unable to provide urine sample at this time.  
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
## 2013-12-26 NOTE — Discharge Instructions (Signed)

## 2013-12-26 NOTE — ED Notes (Signed)
Patient c/o pain under his testicles that travels up into his abdomen for several days.  Denies nausea, vomiting or diarrhea.  C/o dysuria and burning when urinating.

## 2014-01-08 ENCOUNTER — Encounter (HOSPITAL_COMMUNITY): Payer: Self-pay | Admitting: Emergency Medicine

## 2014-01-08 ENCOUNTER — Emergency Department (HOSPITAL_COMMUNITY)
Admission: EM | Admit: 2014-01-08 | Discharge: 2014-01-08 | Disposition: A | Discharge status: Home / Self Care | Payer: BC Managed Care – PPO | Attending: Emergency Medicine | Admitting: Emergency Medicine

## 2014-01-08 DIAGNOSIS — Z79899 Other long term (current) drug therapy: Secondary | ICD-10-CM | POA: Diagnosis not present

## 2014-01-08 DIAGNOSIS — R001 Bradycardia, unspecified: Secondary | ICD-10-CM

## 2014-01-08 DIAGNOSIS — R531 Weakness: Secondary | ICD-10-CM | POA: Diagnosis not present

## 2014-01-08 DIAGNOSIS — R42 Dizziness and giddiness: Secondary | ICD-10-CM | POA: Diagnosis present

## 2014-01-08 DIAGNOSIS — Z792 Long term (current) use of antibiotics: Secondary | ICD-10-CM | POA: Insufficient documentation

## 2014-01-08 DIAGNOSIS — R350 Frequency of micturition: Secondary | ICD-10-CM | POA: Diagnosis not present

## 2014-01-08 DIAGNOSIS — Z87891 Personal history of nicotine dependence: Secondary | ICD-10-CM | POA: Diagnosis not present

## 2014-01-08 DIAGNOSIS — I10 Essential (primary) hypertension: Secondary | ICD-10-CM | POA: Insufficient documentation

## 2014-01-08 DIAGNOSIS — Z8781 Personal history of (healed) traumatic fracture: Secondary | ICD-10-CM | POA: Insufficient documentation

## 2014-01-08 LAB — URINALYSIS, ROUTINE W REFLEX MICROSCOPIC
BILIRUBIN URINE: NEGATIVE
Glucose, UA: NEGATIVE mg/dL
HGB URINE DIPSTICK: NEGATIVE
Ketones, ur: NEGATIVE mg/dL
Leukocytes, UA: NEGATIVE
Nitrite: NEGATIVE
PH: 7 (ref 5.0–8.0)
Protein, ur: NEGATIVE mg/dL
Specific Gravity, Urine: 1.01 (ref 1.005–1.030)
UROBILINOGEN UA: 0.2 mg/dL (ref 0.0–1.0)

## 2014-01-08 LAB — COMPREHENSIVE METABOLIC PANEL
ALK PHOS: 95 U/L (ref 39–117)
ALT: 55 U/L — AB (ref 0–53)
AST: 45 U/L — ABNORMAL HIGH (ref 0–37)
Albumin: 4.2 g/dL (ref 3.5–5.2)
Anion gap: 11 (ref 5–15)
BILIRUBIN TOTAL: 0.3 mg/dL (ref 0.3–1.2)
BUN: 9 mg/dL (ref 6–23)
CO2: 30 meq/L (ref 19–32)
Calcium: 9.6 mg/dL (ref 8.4–10.5)
Chloride: 100 mEq/L (ref 96–112)
Creatinine, Ser: 0.91 mg/dL (ref 0.50–1.35)
GFR calc Af Amer: >90 mL/min (ref >90)
GLUCOSE: 101 mg/dL — AB (ref 70–99)
POTASSIUM: 4.5 meq/L (ref 3.7–5.3)
SODIUM: 141 meq/L (ref 137–147)
Total Protein: 8.7 g/dL — ABNORMAL HIGH (ref 6.0–8.3)

## 2014-01-08 LAB — TSH: TSH: 1.76 u[IU]/mL (ref 0.350–4.500)

## 2014-01-08 LAB — CBC WITH DIFFERENTIAL/PLATELET
Basophils Absolute: 0 10*3/uL (ref 0.0–0.1)
Basophils Relative: 0 % (ref 0–1)
EOS PCT: 3 % (ref 0–5)
Eosinophils Absolute: 0.2 10*3/uL (ref 0.0–0.7)
HCT: 43.1 % (ref 39.0–52.0)
Hemoglobin: 14.4 g/dL (ref 13.0–17.0)
LYMPHS ABS: 1.7 10*3/uL (ref 0.7–4.0)
Lymphocytes Relative: 19 % (ref 12–46)
MCH: 28.8 pg (ref 26.0–34.0)
MCHC: 33.4 g/dL (ref 30.0–36.0)
MCV: 86.2 fL (ref 78.0–100.0)
Monocytes Absolute: 0.7 10*3/uL (ref 0.1–1.0)
Monocytes Relative: 8 % (ref 3–12)
NEUTROS PCT: 70 % (ref 43–77)
Neutro Abs: 6.2 10*3/uL (ref 1.7–7.7)
Platelets: 294 10*3/uL (ref 150–400)
RBC: 5 MIL/uL (ref 4.22–5.81)
RDW: 13.4 % (ref 11.5–15.5)
WBC: 8.8 10*3/uL (ref 4.0–10.5)

## 2014-01-08 LAB — TROPONIN I: Troponin I: <0.3 ng/mL (ref <0.30)

## 2014-01-08 MED ORDER — AMLODIPINE BESYLATE 5 MG PO TABS: 5.0000 mg | ORAL_TABLET | Freq: Every day | ORAL | Status: DC

## 2014-01-08 NOTE — ED Notes (Signed)
MD at bedside. 

## 2014-01-08 NOTE — ED Notes (Signed)
PT c/o generalized weakness, dizziness, and increased in urinary frequency x3 days. PT denies any sob or cp. PT ambulatory with no difficulty noted and drove self to ED.
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
## 2014-01-08 NOTE — Discharge Instructions (Signed)
Bradycardia Call Dr. Verna CzechBranch's office on Monday, 01/11/2014 to schedule appointment within the next 2-3 weeks. Stop verapamil. Start the medication prescribed today for blood pressure. You can take your first dose of the new medicine starting tomorrow Bradycardia is a term for a heart rate (pulse) that, in adults, is slower than 60 beats per minute. A normal rate is 60 to 100 beats per minute. A heart rate below 60 beats per minute may be normal for some adults with healthy hearts. If the rate is too slow, the heart may have trouble pumping the volume of blood the body needs. If the heart rate gets too low, blood flow to the brain may be decreased and may make you feel lightheaded, dizzy, or faint. The heart has a natural pacemaker in the top of the heart called the SA node (sinoatrial or sinus node). This pacemaker sends out regular electrical signals to the muscle of the heart, telling the heart muscle when to beat (contract). The electrical signal travels from the upper parts of the heart (atria) through the AV node (atrioventricular node), to the lower chambers of the heart (ventricles). The ventricles squeeze, pumping the blood from your heart to your lungs and to the rest of your body. CAUSES   Problem with the heart's electrical system.  Problem with the heart's natural pacemaker.  Heart disease, damage, or infection.  Medications.  Problems with minerals and salts (electrolytes). SYMPTOMS   Fainting (syncope).  Fatigue and weakness.  Shortness of breath (dyspnea).  Chest pain (angina).  Drowsiness.  Confusion. DIAGNOSIS   An electrocardiogram (ECG) can help your caregiver determine the type of slow heart rate you have.  If the cause is not seen on an ECG, you may need to wear a heart monitor that records your heart rhythm for several hours or days.  Blood tests. TREATMENT   Electrolyte supplements.  Medications.  Withholding medication which is causing a slow heart  rate.  Pacemaker placement. SEEK IMMEDIATE MEDICAL CARE IF:   You feel lightheaded or faint.  You develop an irregular heart rate.  You feel chest pain or have trouble breathing. MAKE SURE YOU:   Understand these instructions.  Will watch your condition.  Will get help right away if you are not doing well or get worse. Document Released: 11/11/2001 Document Revised: 05/14/2011 Document Reviewed: 05/27/2013 [facility] Patient Information 2015 [place]. This information is not intended to replace advice given to you by your health care provider. Make sure you discuss any questions you have with your health care provider.

## 2014-01-08 NOTE — ED Notes (Signed)
Called lab to confirm that pt has enough blood to add on TSH.

## 2014-01-22 ENCOUNTER — Ambulatory Visit: Payer: BC Managed Care – PPO | Admitting: Cardiology

## 2014-04-04 ENCOUNTER — Emergency Department (HOSPITAL_COMMUNITY): Payer: Self-pay

## 2014-04-04 ENCOUNTER — Encounter (HOSPITAL_COMMUNITY): Payer: Self-pay | Admitting: Emergency Medicine

## 2014-04-04 ENCOUNTER — Observation Stay (HOSPITAL_COMMUNITY)
Admission: EM | Admit: 2014-04-04 | Discharge: 2014-04-04 | Discharge status: Against Medical Advice | Payer: Self-pay | Attending: Emergency Medicine | Admitting: Emergency Medicine

## 2014-04-04 DIAGNOSIS — Z792 Long term (current) use of antibiotics: Secondary | ICD-10-CM | POA: Insufficient documentation

## 2014-04-04 DIAGNOSIS — Z888 Allergy status to other drugs, medicaments and biological substances status: Secondary | ICD-10-CM | POA: Insufficient documentation

## 2014-04-04 DIAGNOSIS — M545 Low back pain: Principal | ICD-10-CM | POA: Insufficient documentation

## 2014-04-04 DIAGNOSIS — R945 Abnormal results of liver function studies: Secondary | ICD-10-CM | POA: Insufficient documentation

## 2014-04-04 DIAGNOSIS — Z885 Allergy status to narcotic agent status: Secondary | ICD-10-CM | POA: Insufficient documentation

## 2014-04-04 DIAGNOSIS — M47892 Other spondylosis, cervical region: Secondary | ICD-10-CM | POA: Insufficient documentation

## 2014-04-04 DIAGNOSIS — Z882 Allergy status to sulfonamides status: Secondary | ICD-10-CM | POA: Insufficient documentation

## 2014-04-04 DIAGNOSIS — Z886 Allergy status to analgesic agent status: Secondary | ICD-10-CM | POA: Insufficient documentation

## 2014-04-04 DIAGNOSIS — Z9181 History of falling: Secondary | ICD-10-CM | POA: Insufficient documentation

## 2014-04-04 DIAGNOSIS — Y9351 Activity, roller skating (inline) and skateboarding: Secondary | ICD-10-CM | POA: Insufficient documentation

## 2014-04-04 DIAGNOSIS — M542 Cervicalgia: Secondary | ICD-10-CM | POA: Insufficient documentation

## 2014-04-04 DIAGNOSIS — R739 Hyperglycemia, unspecified: Secondary | ICD-10-CM | POA: Insufficient documentation

## 2014-04-04 DIAGNOSIS — Z87891 Personal history of nicotine dependence: Secondary | ICD-10-CM | POA: Insufficient documentation

## 2014-04-04 DIAGNOSIS — M25512 Pain in left shoulder: Secondary | ICD-10-CM | POA: Insufficient documentation

## 2014-04-04 DIAGNOSIS — I10 Essential (primary) hypertension: Secondary | ICD-10-CM | POA: Insufficient documentation

## 2014-04-04 DIAGNOSIS — Z79899 Other long term (current) drug therapy: Secondary | ICD-10-CM | POA: Insufficient documentation

## 2014-04-04 DIAGNOSIS — G8250 Quadriplegia, unspecified: Secondary | ICD-10-CM | POA: Diagnosis present

## 2014-04-04 DIAGNOSIS — K7689 Other specified diseases of liver: Secondary | ICD-10-CM

## 2014-04-04 DIAGNOSIS — W1839XA Other fall on same level, initial encounter: Secondary | ICD-10-CM | POA: Insufficient documentation

## 2014-04-04 DIAGNOSIS — R269 Unspecified abnormalities of gait and mobility: Secondary | ICD-10-CM | POA: Insufficient documentation

## 2014-04-04 HISTORY — DX: Abnormal results of liver function studies: R94.5

## 2014-04-04 HISTORY — DX: Spondylosis, unspecified: M47.9

## 2014-04-04 LAB — CBC WITH DIFFERENTIAL/PLATELET
Basophils Absolute: 0 10*3/uL (ref 0.0–0.1)
Basophils Relative: 0 % (ref 0–1)
Eosinophils Absolute: 0.7 10*3/uL (ref 0.0–0.7)
Eosinophils Relative: 6 % — ABNORMAL HIGH (ref 0–5)
HCT: 42 % (ref 39.0–52.0)
Hemoglobin: 14 g/dL (ref 13.0–17.0)
Lymphocytes Relative: 23 % (ref 12–46)
Lymphs Abs: 2.4 10*3/uL (ref 0.7–4.0)
MCH: 29 pg (ref 26.0–34.0)
MCHC: 33.3 g/dL (ref 30.0–36.0)
MCV: 87.1 fL (ref 78.0–100.0)
Monocytes Absolute: 1 10*3/uL (ref 0.1–1.0)
Monocytes Relative: 9 % (ref 3–12)
Neutro Abs: 6.7 10*3/uL (ref 1.7–7.7)
Neutrophils Relative %: 62 % (ref 43–77)
Platelets: 300 10*3/uL (ref 150–400)
RBC: 4.82 MIL/uL (ref 4.22–5.81)
RDW: 13.4 % (ref 11.5–15.5)
WBC: 10.8 10*3/uL — ABNORMAL HIGH (ref 4.0–10.5)

## 2014-04-04 LAB — LACTIC ACID, PLASMA: LACTIC ACID, VENOUS: 1 mmol/L (ref 0.5–2.0)

## 2014-04-04 LAB — COMPREHENSIVE METABOLIC PANEL
ALT: 37 U/L (ref 0–53)
AST: 40 U/L — ABNORMAL HIGH (ref 0–37)
Albumin: 4.7 g/dL (ref 3.5–5.2)
Alkaline Phosphatase: 80 U/L (ref 39–117)
Anion gap: 5 (ref 5–15)
BUN: 10 mg/dL (ref 6–23)
CO2: 31 mmol/L (ref 19–32)
Calcium: 9 mg/dL (ref 8.4–10.5)
Chloride: 102 mmol/L (ref 96–112)
Creatinine, Ser: 1.08 mg/dL (ref 0.50–1.35)
GFR calc Af Amer: >90 mL/min (ref >90)
GFR calc non Af Amer: 81 mL/min — ABNORMAL LOW (ref >90)
Glucose, Bld: 117 mg/dL — ABNORMAL HIGH (ref 70–99)
Potassium: 3.7 mmol/L (ref 3.5–5.1)
Sodium: 138 mmol/L (ref 135–145)
Total Bilirubin: 1 mg/dL (ref 0.3–1.2)
Total Protein: 8.4 g/dL — ABNORMAL HIGH (ref 6.0–8.3)

## 2014-04-04 LAB — CK: Total CK: 242 U/L — ABNORMAL HIGH (ref 7–232)

## 2014-04-04 MED ORDER — MORPHINE SULFATE 4 MG/ML IJ SOLN
4.0000 mg | Freq: Once | INTRAMUSCULAR | Status: AC
  Administered 2014-04-04: 4 mg via INTRAVENOUS
  Filled 2014-04-04: qty 1

## 2014-04-04 NOTE — ED Notes (Signed)
Pt. Reports he fell tonight while roller skating, pt. Reports catching himself with wrist/arm. Pt. Denies hitting head. Pt. Reports after going home he was ambulatory, reports he was sitting in chair and after ambulating to bathroom laid down. Pt. Reports he was then unable to move bilateral legs, and had limited movement of arms.

## 2014-04-04 NOTE — ED Notes (Signed)
Patient pacing beside bed, moves all extremities well.  Patient states nothing is wrong with him and he is leaving.  IV d/c'd; catheter intact and site WNL.  Risks and benefits given and patient verbalized understanding.  [facility]  Patient ambulatory with steady gait.
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
## 2014-04-04 NOTE — ED Notes (Signed)
Per EMS, patient went roller skating with his daughter yesterday.  States he got up and went to bathroom and when he got back in the bed, he wasn't able to move.
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
## 2014-06-03 ENCOUNTER — Other Ambulatory Visit (HOSPITAL_COMMUNITY): Payer: Self-pay | Admitting: Family Medicine

## 2014-06-03 ENCOUNTER — Ambulatory Visit (HOSPITAL_COMMUNITY)
Admission: RE | Admit: 2014-06-03 | Discharge: 2014-06-03 | Disposition: A | Discharge status: Home / Self Care | Payer: Self-pay | Source: Ambulatory Visit | Attending: Family Medicine | Admitting: Family Medicine

## 2014-06-03 DIAGNOSIS — R609 Edema, unspecified: Secondary | ICD-10-CM

## 2014-06-03 DIAGNOSIS — S99911A Unspecified injury of right ankle, initial encounter: Secondary | ICD-10-CM | POA: Insufficient documentation

## 2014-06-03 DIAGNOSIS — M25571 Pain in right ankle and joints of right foot: Secondary | ICD-10-CM | POA: Insufficient documentation

## 2014-06-03 DIAGNOSIS — X58XXXA Exposure to other specified factors, initial encounter: Secondary | ICD-10-CM | POA: Insufficient documentation

## 2014-06-15 ENCOUNTER — Encounter (HOSPITAL_COMMUNITY): Payer: Self-pay | Admitting: Cardiology

## 2014-06-15 ENCOUNTER — Emergency Department (HOSPITAL_COMMUNITY)
Admission: EM | Admit: 2014-06-15 | Discharge: 2014-06-15 | Disposition: A | Discharge status: Home / Self Care | Payer: 59 | Attending: Emergency Medicine | Admitting: Emergency Medicine

## 2014-06-15 DIAGNOSIS — Z87828 Personal history of other (healed) physical injury and trauma: Secondary | ICD-10-CM | POA: Diagnosis not present

## 2014-06-15 DIAGNOSIS — Z8719 Personal history of other diseases of the digestive system: Secondary | ICD-10-CM | POA: Insufficient documentation

## 2014-06-15 DIAGNOSIS — Z87891 Personal history of nicotine dependence: Secondary | ICD-10-CM | POA: Insufficient documentation

## 2014-06-15 DIAGNOSIS — M25571 Pain in right ankle and joints of right foot: Secondary | ICD-10-CM | POA: Diagnosis not present

## 2014-06-15 DIAGNOSIS — I10 Essential (primary) hypertension: Secondary | ICD-10-CM | POA: Insufficient documentation

## 2014-06-15 DIAGNOSIS — Z8781 Personal history of (healed) traumatic fracture: Secondary | ICD-10-CM | POA: Insufficient documentation

## 2014-06-15 DIAGNOSIS — M25471 Effusion, right ankle: Secondary | ICD-10-CM | POA: Diagnosis not present

## 2014-06-15 DIAGNOSIS — Z79899 Other long term (current) drug therapy: Secondary | ICD-10-CM | POA: Diagnosis not present
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
## 2014-06-15 NOTE — Discharge Instructions (Signed)
I have reviewed your x-rays. No fracture or dislocation noted. Your examination suggests musculoskeletal injury. No neurovascular problem identified. Please use the ankle stirrup splint daily. Please see the orthopedic specialist listed above, or the orthopedic specialist of your choice as soon as possible for evaluation of this problem. Ankle Pain Ankle pain is a common symptom. The bones, cartilage, tendons, and muscles of the ankle joint perform a lot of work each day. The ankle joint holds your body weight and allows you to move around. Ankle pain can occur on either side or back of 1 or both ankles. Ankle pain may be sharp and burning or dull and aching. There may be tenderness, stiffness, redness, or warmth around the ankle. The pain occurs more often when a person walks or puts pressure on the ankle. CAUSES  There are many reasons ankle pain can develop. It is important to work with your caregiver to identify the cause since many conditions can impact the bones, cartilage, muscles, and tendons. Causes for ankle pain include:  Injury, including a break (fracture), sprain, or strain often due to a fall, sports, or a high-impact activity.  Swelling (inflammation) of a tendon (tendonitis).  Achilles tendon rupture.  Ankle instability after repeated sprains and strains.  Poor foot alignment.  Pressure on a nerve (tarsal tunnel syndrome).  Arthritis in the ankle or the lining of the ankle.  Crystal formation in the ankle (gout or pseudogout). DIAGNOSIS  A diagnosis is based on your medical history, your symptoms, results of your physical exam, and results of diagnostic tests. Diagnostic tests may include X-ray exams or a computerized magnetic scan (magnetic resonance imaging, MRI). TREATMENT  Treatment will depend on the cause of your ankle pain and may include:  Keeping pressure off the ankle and limiting activities.  Using crutches or other walking support (a cane or brace).  Using  rest, ice, compression, and elevation.  Participating in physical therapy or home exercises.  Wearing shoe inserts or special shoes.  Losing weight.  Taking medications to reduce pain or swelling or receiving an injection.  Undergoing surgery. HOME CARE INSTRUCTIONS   Only take over-the-counter or prescription medicines for pain, discomfort, or fever as directed by your caregiver.  Put ice on the injured area.  Put ice in a plastic bag.  Place a towel between your skin and the bag.  Leave the ice on for 15-20 minutes at a time, 03-04 times a day.  Keep your leg raised (elevated) when possible to lessen swelling.  Avoid activities that cause ankle pain.  Follow specific exercises as directed by your caregiver.  Record how often you have ankle pain, the location of the pain, and what it feels like. This information may be helpful to you and your caregiver.  Ask your caregiver about returning to work or sports and whether you should drive.  Follow up with your caregiver for further examination, therapy, or testing as directed. SEEK MEDICAL CARE IF:   Pain or swelling continues or worsens beyond 1 week.  You have an oral temperature above 102 F (38.9 C).  You are feeling unwell or have chills.  You are having an increasingly difficult time with walking.  You have loss of sensation or other new symptoms.  You have questions or concerns. MAKE SURE YOU:   Understand these instructions.  Will watch your condition.  Will get help right away if you are not doing well or get worse. Document Released: 08/09/2009 Document Revised: 05/14/2011 Document Reviewed: 08/09/2009  [employer]® Patient Information ©2015 [employer]. This information is not intended to replace advice given to you by your health care provider. Make sure you discuss any questions you have with your health care provider. ° °
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
## 2014-06-18 ENCOUNTER — Ambulatory Visit (HOSPITAL_COMMUNITY): Payer: 59 | Attending: Orthopaedic Surgery | Admitting: Physical Therapy

## 2014-06-18 DIAGNOSIS — M79605 Pain in left leg: Secondary | ICD-10-CM | POA: Insufficient documentation

## 2014-06-18 DIAGNOSIS — M79604 Pain in right leg: Secondary | ICD-10-CM | POA: Insufficient documentation

## 2014-06-24 ENCOUNTER — Ambulatory Visit (HOSPITAL_COMMUNITY): Payer: 59 | Admitting: Physical Therapy

## 2014-07-24 ENCOUNTER — Emergency Department (HOSPITAL_COMMUNITY)
Admission: EM | Admit: 2014-07-24 | Discharge: 2014-07-24 | Disposition: A | Discharge status: Home / Self Care | Payer: 59 | Attending: Emergency Medicine | Admitting: Emergency Medicine

## 2014-07-24 ENCOUNTER — Emergency Department (HOSPITAL_COMMUNITY): Payer: 59

## 2014-07-24 ENCOUNTER — Encounter (HOSPITAL_COMMUNITY): Payer: Self-pay

## 2014-07-24 DIAGNOSIS — Z8719 Personal history of other diseases of the digestive system: Secondary | ICD-10-CM | POA: Insufficient documentation

## 2014-07-24 DIAGNOSIS — R197 Diarrhea, unspecified: Secondary | ICD-10-CM | POA: Diagnosis not present

## 2014-07-24 DIAGNOSIS — Z79899 Other long term (current) drug therapy: Secondary | ICD-10-CM | POA: Diagnosis not present

## 2014-07-24 DIAGNOSIS — I10 Essential (primary) hypertension: Secondary | ICD-10-CM | POA: Diagnosis not present

## 2014-07-24 DIAGNOSIS — Z87828 Personal history of other (healed) physical injury and trauma: Secondary | ICD-10-CM | POA: Insufficient documentation

## 2014-07-24 DIAGNOSIS — R42 Dizziness and giddiness: Secondary | ICD-10-CM | POA: Insufficient documentation

## 2014-07-24 DIAGNOSIS — R1084 Generalized abdominal pain: Secondary | ICD-10-CM

## 2014-07-24 DIAGNOSIS — Z87891 Personal history of nicotine dependence: Secondary | ICD-10-CM | POA: Insufficient documentation

## 2014-07-24 DIAGNOSIS — R52 Pain, unspecified: Secondary | ICD-10-CM | POA: Diagnosis present

## 2014-07-24 DIAGNOSIS — Z8739 Personal history of other diseases of the musculoskeletal system and connective tissue: Secondary | ICD-10-CM | POA: Insufficient documentation

## 2014-07-24 DIAGNOSIS — Z8781 Personal history of (healed) traumatic fracture: Secondary | ICD-10-CM | POA: Diagnosis not present

## 2014-07-24 LAB — CBC WITH DIFFERENTIAL/PLATELET
BASOS PCT: 0 % (ref 0–1)
Basophils Absolute: 0 10*3/uL (ref 0.0–0.1)
Eosinophils Absolute: 0.3 10*3/uL (ref 0.0–0.7)
Eosinophils Relative: 3 % (ref 0–5)
HCT: 42.2 % (ref 39.0–52.0)
Hemoglobin: 14 g/dL (ref 13.0–17.0)
LYMPHS ABS: 3 10*3/uL (ref 0.7–4.0)
LYMPHS PCT: 31 % (ref 12–46)
MCH: 29 pg (ref 26.0–34.0)
MCHC: 33.2 g/dL (ref 30.0–36.0)
MCV: 87.4 fL (ref 78.0–100.0)
MONO ABS: 0.8 10*3/uL (ref 0.1–1.0)
Monocytes Relative: 8 % (ref 3–12)
Neutro Abs: 5.6 10*3/uL (ref 1.7–7.7)
Neutrophils Relative %: 58 % (ref 43–77)
PLATELETS: 295 10*3/uL (ref 150–400)
RBC: 4.83 MIL/uL (ref 4.22–5.81)
RDW: 13 % (ref 11.5–15.5)
WBC: 9.6 10*3/uL (ref 4.0–10.5)

## 2014-07-24 LAB — URINALYSIS, ROUTINE W REFLEX MICROSCOPIC
Bilirubin Urine: NEGATIVE
GLUCOSE, UA: NEGATIVE mg/dL
Ketones, ur: NEGATIVE mg/dL
Leukocytes, UA: NEGATIVE
Nitrite: NEGATIVE
Protein, ur: NEGATIVE mg/dL
Specific Gravity, Urine: 1.02 (ref 1.005–1.030)
Urobilinogen, UA: 0.2 mg/dL (ref 0.0–1.0)
pH: 6 (ref 5.0–8.0)

## 2014-07-24 LAB — I-STAT CG4 LACTIC ACID, ED: LACTIC ACID, VENOUS: 1.55 mmol/L (ref 0.5–2.0)

## 2014-07-24 LAB — COMPREHENSIVE METABOLIC PANEL
ALT: 44 U/L (ref 17–63)
AST: 36 U/L (ref 15–41)
Albumin: 4.3 g/dL (ref 3.5–5.0)
Alkaline Phosphatase: 76 U/L (ref 38–126)
Anion gap: 8 (ref 5–15)
BUN: 9 mg/dL (ref 6–20)
CO2: 28 mmol/L (ref 22–32)
Calcium: 9 mg/dL (ref 8.9–10.3)
Chloride: 104 mmol/L (ref 101–111)
Creatinine, Ser: 0.93 mg/dL (ref 0.61–1.24)
GFR calc Af Amer: >60 mL/min (ref >60)
GLUCOSE: 113 mg/dL — AB (ref 65–99)
POTASSIUM: 3.5 mmol/L (ref 3.5–5.1)
SODIUM: 140 mmol/L (ref 135–145)
TOTAL PROTEIN: 7.8 g/dL (ref 6.5–8.1)
Total Bilirubin: 0.5 mg/dL (ref 0.3–1.2)

## 2014-07-24 LAB — URINE MICROSCOPIC-ADD ON

## 2014-07-24 LAB — LIPASE, BLOOD: Lipase: 34 U/L (ref 22–51)

## 2014-07-24 MED ORDER — KETOROLAC TROMETHAMINE 30 MG/ML IJ SOLN
30.0000 mg | Freq: Once | INTRAMUSCULAR | Status: AC
  Administered 2014-07-24: 30 mg via INTRAVENOUS

## 2014-07-24 MED ORDER — IOHEXOL 300 MG/ML  SOLN
50.0000 mL | Freq: Once | INTRAMUSCULAR | Status: AC | PRN
  Administered 2014-07-24: 50 mL via ORAL

## 2014-07-24 MED ORDER — SODIUM CHLORIDE 0.9 % IV BOLUS (SEPSIS)
1000.0000 mL | Freq: Once | INTRAVENOUS | Status: AC
  Administered 2014-07-24: 1000 mL via INTRAVENOUS

## 2014-07-24 MED ORDER — MORPHINE SULFATE 4 MG/ML IJ SOLN
4.0000 mg | Freq: Once | INTRAMUSCULAR | Status: AC
  Administered 2014-07-24: 4 mg via INTRAVENOUS
  Filled 2014-07-24: qty 1

## 2014-07-24 MED ORDER — KETOROLAC TROMETHAMINE 30 MG/ML IJ SOLN
INTRAMUSCULAR | Status: AC
  Filled 2014-07-24: qty 1

## 2014-07-24 MED ORDER — IOHEXOL 300 MG/ML  SOLN
100.0000 mL | Freq: Once | INTRAMUSCULAR | Status: AC | PRN
  Administered 2014-07-24: 100 mL via INTRAVENOUS

## 2014-07-24 NOTE — ED Notes (Signed)
Multiple complaints, states both his shoulders hurt and he feels weak all over.  Pt also c/o abd pain, dizzy, frequent stools that are soft.
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
## 2014-07-24 NOTE — Discharge Instructions (Signed)

## 2014-07-26 ENCOUNTER — Emergency Department (HOSPITAL_COMMUNITY)
Admission: EM | Admit: 2014-07-26 | Discharge: 2014-07-26 | Disposition: A | Discharge status: Home / Self Care | Payer: 59 | Attending: Emergency Medicine | Admitting: Emergency Medicine

## 2014-07-26 ENCOUNTER — Encounter (HOSPITAL_COMMUNITY): Payer: Self-pay | Admitting: Emergency Medicine

## 2014-07-26 DIAGNOSIS — Z8781 Personal history of (healed) traumatic fracture: Secondary | ICD-10-CM | POA: Diagnosis not present

## 2014-07-26 DIAGNOSIS — I10 Essential (primary) hypertension: Secondary | ICD-10-CM | POA: Diagnosis not present

## 2014-07-26 DIAGNOSIS — Z79899 Other long term (current) drug therapy: Secondary | ICD-10-CM | POA: Insufficient documentation

## 2014-07-26 DIAGNOSIS — Z8739 Personal history of other diseases of the musculoskeletal system and connective tissue: Secondary | ICD-10-CM | POA: Diagnosis not present

## 2014-07-26 DIAGNOSIS — Z8719 Personal history of other diseases of the digestive system: Secondary | ICD-10-CM | POA: Diagnosis not present

## 2014-07-26 DIAGNOSIS — R51 Headache: Secondary | ICD-10-CM | POA: Diagnosis not present

## 2014-07-26 DIAGNOSIS — Z87828 Personal history of other (healed) physical injury and trauma: Secondary | ICD-10-CM | POA: Diagnosis not present

## 2014-07-26 DIAGNOSIS — R220 Localized swelling, mass and lump, head: Secondary | ICD-10-CM | POA: Diagnosis present

## 2014-07-26 DIAGNOSIS — R519 Headache, unspecified: Secondary | ICD-10-CM

## 2014-07-26 DIAGNOSIS — Z792 Long term (current) use of antibiotics: Secondary | ICD-10-CM | POA: Diagnosis not present

## 2014-07-26 DIAGNOSIS — Z87891 Personal history of nicotine dependence: Secondary | ICD-10-CM | POA: Diagnosis not present

## 2014-07-26 LAB — HEPATITIS PANEL, ACUTE
HCV AB: REACTIVE — AB
HEP B C IGM: NONREACTIVE
Hep A IgM: NONREACTIVE
Hepatitis B Surface Ag: NEGATIVE

## 2014-07-26 LAB — RAPID STREP SCREEN (MED CTR MEBANE ONLY): Streptococcus, Group A Screen (Direct): NEGATIVE

## 2014-07-26 NOTE — ED Notes (Signed)
Patient c/o facial swelling that started last night.
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
## 2014-07-27 ENCOUNTER — Telehealth (HOSPITAL_COMMUNITY): Payer: Self-pay

## 2014-07-27 NOTE — Telephone Encounter (Signed)
Reactive HCV Ab- chart taken to EDP office for review.

## 2014-07-28 ENCOUNTER — Observation Stay (HOSPITAL_COMMUNITY)
Admission: EM | Admit: 2014-07-28 | Discharge: 2014-07-31 | Disposition: A | Discharge status: Home / Self Care | Payer: 59 | Attending: Family Medicine | Admitting: Family Medicine

## 2014-07-28 ENCOUNTER — Encounter (HOSPITAL_COMMUNITY): Payer: Self-pay | Admitting: Intensive Care

## 2014-07-28 DIAGNOSIS — G08 Intracranial and intraspinal phlebitis and thrombophlebitis: Secondary | ICD-10-CM

## 2014-07-28 DIAGNOSIS — I639 Cerebral infarction, unspecified: Secondary | ICD-10-CM

## 2014-07-28 DIAGNOSIS — D72829 Elevated white blood cell count, unspecified: Secondary | ICD-10-CM | POA: Insufficient documentation

## 2014-07-28 DIAGNOSIS — F32A Depression, unspecified: Secondary | ICD-10-CM | POA: Diagnosis present

## 2014-07-28 DIAGNOSIS — F418 Other specified anxiety disorders: Secondary | ICD-10-CM

## 2014-07-28 DIAGNOSIS — M47892 Other spondylosis, cervical region: Secondary | ICD-10-CM | POA: Insufficient documentation

## 2014-07-28 DIAGNOSIS — Z79899 Other long term (current) drug therapy: Secondary | ICD-10-CM | POA: Diagnosis not present

## 2014-07-28 DIAGNOSIS — I10 Essential (primary) hypertension: Secondary | ICD-10-CM | POA: Diagnosis present

## 2014-07-28 DIAGNOSIS — Z8781 Personal history of (healed) traumatic fracture: Secondary | ICD-10-CM | POA: Diagnosis not present

## 2014-07-28 DIAGNOSIS — M5104 Intervertebral disc disorders with myelopathy, thoracic region: Secondary | ICD-10-CM

## 2014-07-28 DIAGNOSIS — F449 Dissociative and conversion disorder, unspecified: Secondary | ICD-10-CM | POA: Diagnosis present

## 2014-07-28 DIAGNOSIS — Z87828 Personal history of other (healed) physical injury and trauma: Secondary | ICD-10-CM | POA: Diagnosis not present

## 2014-07-28 DIAGNOSIS — R29898 Other symptoms and signs involving the musculoskeletal system: Secondary | ICD-10-CM | POA: Diagnosis not present

## 2014-07-28 DIAGNOSIS — R531 Weakness: Secondary | ICD-10-CM | POA: Diagnosis not present

## 2014-07-28 DIAGNOSIS — G039 Meningitis, unspecified: Secondary | ICD-10-CM

## 2014-07-28 DIAGNOSIS — M549 Dorsalgia, unspecified: Secondary | ICD-10-CM | POA: Diagnosis present

## 2014-07-28 DIAGNOSIS — R200 Anesthesia of skin: Secondary | ICD-10-CM

## 2014-07-28 DIAGNOSIS — M542 Cervicalgia: Secondary | ICD-10-CM

## 2014-07-28 DIAGNOSIS — G8929 Other chronic pain: Secondary | ICD-10-CM | POA: Diagnosis present

## 2014-07-28 DIAGNOSIS — F329 Major depressive disorder, single episode, unspecified: Secondary | ICD-10-CM | POA: Diagnosis present

## 2014-07-28 DIAGNOSIS — F419 Anxiety disorder, unspecified: Secondary | ICD-10-CM | POA: Diagnosis present

## 2014-07-28 LAB — CBC WITH DIFFERENTIAL/PLATELET
Basophils Absolute: 0 10*3/uL (ref 0.0–0.1)
Basophils Relative: 0 % (ref 0–1)
Eosinophils Absolute: 0.3 10*3/uL (ref 0.0–0.7)
Eosinophils Relative: 2 % (ref 0–5)
HCT: 42.8 % (ref 39.0–52.0)
Hemoglobin: 14.1 g/dL (ref 13.0–17.0)
LYMPHS ABS: 1.5 10*3/uL (ref 0.7–4.0)
LYMPHS PCT: 9 % — AB (ref 12–46)
MCH: 29.1 pg (ref 26.0–34.0)
MCHC: 32.9 g/dL (ref 30.0–36.0)
MCV: 88.4 fL (ref 78.0–100.0)
MONO ABS: 1 10*3/uL (ref 0.1–1.0)
Monocytes Relative: 6 % (ref 3–12)
NEUTROS ABS: 12.8 10*3/uL — AB (ref 1.7–7.7)
Neutrophils Relative %: 83 % — ABNORMAL HIGH (ref 43–77)
PLATELETS: 269 10*3/uL (ref 150–400)
RBC: 4.84 MIL/uL (ref 4.22–5.81)
RDW: 13.4 % (ref 11.5–15.5)
WBC: 15.5 10*3/uL — ABNORMAL HIGH (ref 4.0–10.5)

## 2014-07-28 LAB — COMPREHENSIVE METABOLIC PANEL
ALK PHOS: 79 U/L (ref 38–126)
ALT: 42 U/L (ref 17–63)
ANION GAP: 10 (ref 5–15)
AST: 38 U/L (ref 15–41)
Albumin: 4 g/dL (ref 3.5–5.0)
BUN: 10 mg/dL (ref 6–20)
CALCIUM: 8.7 mg/dL — AB (ref 8.9–10.3)
CO2: 28 mmol/L (ref 22–32)
Chloride: 100 mmol/L — ABNORMAL LOW (ref 101–111)
Creatinine, Ser: 1.06 mg/dL (ref 0.61–1.24)
GFR calc Af Amer: >60 mL/min (ref >60)
GFR calc non Af Amer: >60 mL/min (ref >60)
GLUCOSE: 128 mg/dL — AB (ref 65–99)
Potassium: 3.8 mmol/L (ref 3.5–5.1)
Sodium: 138 mmol/L (ref 135–145)
TOTAL PROTEIN: 7.3 g/dL (ref 6.5–8.1)
Total Bilirubin: 0.8 mg/dL (ref 0.3–1.2)

## 2014-07-28 LAB — CK: Total CK: 48 U/L — ABNORMAL LOW (ref 49–397)

## 2014-07-28 LAB — CULTURE, GROUP A STREP: STREP A CULTURE: NEGATIVE

## 2014-07-28 LAB — SEDIMENTATION RATE: SED RATE: 8 mm/h (ref 0–16)

## 2014-07-28 LAB — TSH: TSH: 3.326 u[IU]/mL (ref 0.350–4.500)

## 2014-07-28 MED ORDER — MORPHINE SULFATE 4 MG/ML IJ SOLN
4.0000 mg | Freq: Once | INTRAMUSCULAR | Status: AC
  Administered 2014-07-28: 4 mg via INTRAVENOUS
  Filled 2014-07-28: qty 1

## 2014-07-28 MED ORDER — OXYCODONE HCL 5 MG PO TABS
10.0000 mg | ORAL_TABLET | Freq: Three times a day (TID) | ORAL | Status: DC
  Administered 2014-07-28 – 2014-07-29 (×3): 10 mg via ORAL
  Filled 2014-07-28 (×3): qty 2

## 2014-07-28 MED ORDER — ONDANSETRON HCL 4 MG/2ML IJ SOLN
4.0000 mg | Freq: Once | INTRAMUSCULAR | Status: AC
  Administered 2014-07-28: 4 mg via INTRAVENOUS
  Filled 2014-07-28: qty 2

## 2014-07-28 MED ORDER — SODIUM CHLORIDE 0.9 % IV BOLUS (SEPSIS)
1000.0000 mL | Freq: Once | INTRAVENOUS | Status: AC
  Administered 2014-07-28: 1000 mL via INTRAVENOUS

## 2014-07-28 MED ORDER — MORPHINE SULFATE 4 MG/ML IJ SOLN
4.0000 mg | INTRAMUSCULAR | Status: DC | PRN
  Administered 2014-07-28: 4 mg via INTRAVENOUS
  Filled 2014-07-28: qty 1

## 2014-07-28 MED ORDER — AMLODIPINE BESYLATE 5 MG PO TABS
5.0000 mg | ORAL_TABLET | Freq: Every day | ORAL | Status: DC
  Administered 2014-07-29 – 2014-07-31 (×3): 5 mg via ORAL
  Filled 2014-07-28 (×3): qty 1

## 2014-07-28 MED ORDER — HEPARIN SODIUM (PORCINE) 5000 UNIT/ML IJ SOLN
5000.0000 [IU] | Freq: Three times a day (TID) | INTRAMUSCULAR | Status: DC
  Administered 2014-07-28 – 2014-07-29 (×2): 5000 [IU] via SUBCUTANEOUS
  Filled 2014-07-28 (×5): qty 1

## 2014-07-28 MED ORDER — GABAPENTIN 300 MG PO CAPS
300.0000 mg | ORAL_CAPSULE | Freq: Two times a day (BID) | ORAL | Status: DC
  Administered 2014-07-28 – 2014-07-31 (×6): 300 mg via ORAL
  Filled 2014-07-28 (×6): qty 1
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
## 2014-07-28 NOTE — Progress Notes (Signed)
Pt still complaining of not being able to void. Bladder Scan showed 549 ml. Dr notified.

## 2014-07-28 NOTE — ED Notes (Addendum)
Pt reports generalized numbness and tingling began at 800am this am. Pt reports "this has been going on for awhile."

## 2014-07-28 NOTE — ED Notes (Signed)
Pt states he woke up this morning around 0800 with back pain, weakness all over, and could not walk. States he feels numbness and tingling in his fingers. Distal pulses strong
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
## 2014-07-29 ENCOUNTER — Observation Stay (HOSPITAL_COMMUNITY): Payer: 59

## 2014-07-29 LAB — URINALYSIS, ROUTINE W REFLEX MICROSCOPIC
Bilirubin Urine: NEGATIVE
Glucose, UA: NEGATIVE mg/dL
HGB URINE DIPSTICK: NEGATIVE
Ketones, ur: NEGATIVE mg/dL
Leukocytes, UA: NEGATIVE
Nitrite: NEGATIVE
Protein, ur: NEGATIVE mg/dL
Specific Gravity, Urine: 1.025 (ref 1.005–1.030)
Urobilinogen, UA: 0.2 mg/dL (ref 0.0–1.0)
pH: 6 (ref 5.0–8.0)

## 2014-07-29 LAB — VITAMIN B12: VITAMIN B 12: 835 pg/mL (ref 180–914)

## 2014-07-29 LAB — MRSA PCR SCREENING: MRSA by PCR: NEGATIVE

## 2014-07-29 MED ORDER — LORAZEPAM 1 MG PO TABS
1.0000 mg | ORAL_TABLET | Freq: Three times a day (TID) | ORAL | Status: DC | PRN
  Administered 2014-07-29 – 2014-07-31 (×7): 1 mg via ORAL
  Filled 2014-07-29 (×8): qty 1

## 2014-07-29 MED ORDER — OXYCODONE HCL 5 MG PO TABS
10.0000 mg | ORAL_TABLET | Freq: Four times a day (QID) | ORAL | Status: DC | PRN
  Administered 2014-07-29 – 2014-07-31 (×6): 10 mg via ORAL
  Filled 2014-07-29 (×7): qty 2

## 2014-07-29 MED ORDER — DIPHENHYDRAMINE HCL 25 MG PO CAPS
25.0000 mg | ORAL_CAPSULE | Freq: Once | ORAL | Status: AC
  Administered 2014-07-29: 25 mg via ORAL
  Filled 2014-07-29: qty 1
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
## 2014-07-29 NOTE — Progress Notes (Signed)
Weakness first incident resolved spontaneously and he left AMA the patient has a similar story of a 36 hour history of precipitous weakness or no chills no nausea no vomiting no loss of urinary continence patient 9 MRI of LS spine today to rule out epidural abscess or other space-occupying lesion neurology consult will be obtained also to rule out any ascending paralysis Terance IceDallas R Houdek ZOX:[id number] DOB: 12/12/1970 DOA: 07/28/2014 PCP: [name],Laith Antonelli M, MD             Physical Exam: Blood pressure 114/65, pulse 54, temperature 97.5 F (36.4 C), temperature source Oral, resp. rate 20, height 5\' 11"  (1.803 m), weight 141 lb 4.8 oz (64.093 kg), SpO2 98 %. lungs clear to A&P no rales wheeze rhonchi heart regular rhythm no S3 or S4 no heaves thrills rubs nontender bowel sounds normoactive bilateral lower extremity weakness shows 3 out of 5 motor strength bilaterally plantars are downgoing DTRs 1+ symmetrical   Investigations:  Recent Results (from the past 240 hour(s))  Rapid strep screen     Status: None   Collection Time: 07/26/14  1:30 AM  Result Value Ref Range Status   Streptococcus, Group A Screen (Direct) NEGATIVE NEGATIVE Corrected    Comment: (NOTE) A Rapid Antigen test may result negative if the antigen level in the sample is below the detection level of this test. The FDA has not cleared this test as a stand-alone test therefore the rapid antigen negative result has reflexed to a Group A Strep culture. CORRECTED ON 05/23 AT 81190228: PREVIOUSLY REPORTED AS NEGATIVE   Culture, Group A Strep     Status: None   Collection Time: 07/26/14  1:30 AM  Result Value Ref Range Status   Strep A Culture Negative  Final    Comment: (NOTE) Performed At: [facility] [address] [place], [place] [id number] Mila HomerHancock William F MD ZH:[id number]:[phone]      Basic Metabolic Panel:  Recent Labs  29/52/8405/25/16 1747  NA 138  K 3.8  CL 100*  CO2 28  GLUCOSE 128*  BUN 10  CREATININE  1.06  CALCIUM 8.7*   Liver Function Tests:  Recent Labs  07/28/14 1747  AST 38  ALT 42  ALKPHOS 79  BILITOT 0.8  PROT 7.3  ALBUMIN 4.0     CBC:  Recent Labs  07/28/14 1747  WBC 15.5*  NEUTROABS 12.8*  HGB 14.1  HCT 42.8  MCV 88.4  PLT 269    No results found.    Medications:   Impression:  Principal Problem:   Weakness of both legs Active Problems:   Neck pain, chronic   Essential hypertension, benign   Numbness of arm   Anxiety and depression   Conversion disorder     Plan: MRI of LS-spine to rule out space-occupying lesion or epidural abscess. Neurology consultation to rule out any metabolic or ascending paralysis   Consultants: Neurology requested    Procedures   Antibiotics:                   Code Status: Full   Family Communication:    Disposition Plan see plan above  Time spent: 30 minutes   LOS: 1 day   Demareon Coldwell M   07/29/2014, 1:40 PM

## 2014-07-29 NOTE — Care Management Note (Signed)
Case Management Note  Patient Details  Name: Benjamin Vaughn MRN: [id number] Date of Birth: 07/09/1970  Subjective/Objective:                  Pt admitted from home with inability to ambulate. Pt lives with his mother and will return home at discharge. Pt is normally independent with ADL's.  Action/Plan: CM did speak with pt about consulting PT while in hospital and pt declines and stated that he would not do any PT. No other CM needs noted. Awaiting neuro consult.  Expected Discharge Date:  07/31/14               Expected Discharge Plan:  Home/Self Care  In-House Referral:  NA  Discharge planning Services  CM Consult  Post Acute Care Choice:  NA Choice offered to:  NA  DME Arranged:    DME Agency:     HH Arranged:    HH Agency:     Status of Service:     Medicare Important Message Given:    Date Medicare IM Given:    Medicare IM give by:    Date Additional Medicare IM Given:    Additional Medicare Important Message give by:     If discussed at Long Length of Stay Meetings, dates discussed:    Additional Comments:  Cheryl FlashBlackwell, [name], RN 07/29/2014, 11:26 AM

## 2014-07-30 ENCOUNTER — Observation Stay (HOSPITAL_COMMUNITY): Payer: 59

## 2014-07-30 ENCOUNTER — Encounter (HOSPITAL_COMMUNITY): Payer: Self-pay | Admitting: Emergency Medicine

## 2014-07-30 LAB — CBC WITH DIFFERENTIAL/PLATELET
BASOS PCT: 1 % (ref 0–1)
Basophils Absolute: 0 10*3/uL (ref 0.0–0.1)
Eosinophils Absolute: 0.5 10*3/uL (ref 0.0–0.7)
Eosinophils Relative: 8 % — ABNORMAL HIGH (ref 0–5)
HCT: 42.2 % (ref 39.0–52.0)
Hemoglobin: 13.4 g/dL (ref 13.0–17.0)
LYMPHS ABS: 1.6 10*3/uL (ref 0.7–4.0)
Lymphocytes Relative: 26 % (ref 12–46)
MCH: 28.5 pg (ref 26.0–34.0)
MCHC: 31.8 g/dL (ref 30.0–36.0)
MCV: 89.6 fL (ref 78.0–100.0)
MONO ABS: 0.9 10*3/uL (ref 0.1–1.0)
MONOS PCT: 14 % — AB (ref 3–12)
Neutro Abs: 3.2 10*3/uL (ref 1.7–7.7)
Neutrophils Relative %: 51 % (ref 43–77)
PLATELETS: 282 10*3/uL (ref 150–400)
RBC: 4.71 MIL/uL (ref 4.22–5.81)
RDW: 13.1 % (ref 11.5–15.5)
WBC: 6.1 10*3/uL (ref 4.0–10.5)

## 2014-07-30 LAB — BASIC METABOLIC PANEL WITH GFR
Anion gap: 8 (ref 5–15)
BUN: 11 mg/dL (ref 6–20)
CO2: 33 mmol/L — ABNORMAL HIGH (ref 22–32)
Calcium: 8.6 mg/dL — ABNORMAL LOW (ref 8.9–10.3)
Chloride: 98 mmol/L — ABNORMAL LOW (ref 101–111)
Creatinine, Ser: 0.99 mg/dL (ref 0.61–1.24)
GFR calc Af Amer: >60 mL/min
GFR calc non Af Amer: >60 mL/min
Glucose, Bld: 94 mg/dL (ref 65–99)
Potassium: 3.9 mmol/L (ref 3.5–5.1)
Sodium: 139 mmol/L (ref 135–145)

## 2014-07-30 LAB — PROTEIN AND GLUCOSE, CSF
Glucose, CSF: 70 mg/dL (ref 40–70)
Total  Protein, CSF: 25 mg/dL (ref 15–45)

## 2014-07-30 LAB — CSF CELL COUNT WITH DIFFERENTIAL
RBC COUNT CSF: 2 /mm3 — AB
Tube #: 2
WBC CSF: 2 /mm3 (ref 0–5)

## 2014-07-30 LAB — HIV ANTIBODY (ROUTINE TESTING W REFLEX): HIV Screen 4th Generation wRfx: NONREACTIVE

## 2014-07-30 MED ORDER — DIPHENHYDRAMINE HCL 25 MG PO CAPS
50.0000 mg | ORAL_CAPSULE | Freq: Four times a day (QID) | ORAL | Status: DC | PRN
  Administered 2014-07-30: 50 mg via ORAL
  Filled 2014-07-30: qty 2

## 2014-07-30 MED ORDER — LIDOCAINE HCL (PF) 1 % IJ SOLN
INTRAMUSCULAR | Status: AC
Start: 2014-07-30 — End: 2014-07-31
  Filled 2014-07-30: qty 10

## 2014-07-30 MED ORDER — POVIDONE-IODINE 10 % EX SOLN
CUTANEOUS | Status: AC
  Filled 2014-07-30: qty 15

## 2014-07-30 NOTE — Progress Notes (Signed)
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 MRI of brain yesterday revealed focal cortical thickening of the posterior right temporal and right parietal lobe with a question raised of focal cerebritis of the scans recommended to elucidate the above questionable findings include MRA post contrast MR the angiogram and repeat MRI these subsequent scans are pending at the time of this dictation patient states he ambulates to the bathroom no loss of urinary control or fecal incontinence Benjamin Vaughn:[id number] DOB: October 06, 1970 DOA: 07/28/2014 PCP: Isabella Stalling, MD             Physical Exam: Blood pressure 118/65, pulse 71, temperature 98.7 F (37.1 C), temperature source Oral, resp. rate 20, height  (1.803 m), weight 141 lb 4.8 oz (64.093 kg), SpO2 98 %.lungs clear to A&P no rales wheeze rhonchi heart regular rhythm no S3 or S4 no heaves thrills rubs abdomen soft nontender bowel sounds normoactive no guarding or rebound masses no megalyneurologic exam patient does not cooperate fully   Investigations:  Recent Results (from the past 240 hour(s))  Rapid strep screen     Status: None   Collection Time: 07/26/14  1:30 AM  Result Value Ref Range Status   Streptococcus, Group A Screen (Direct) NEGATIVE NEGATIVE Corrected    Comment: (NOTE) A Rapid Antigen test may result negative if the antigen level in the sample is below the detection level of this test. The FDA has not cleared this test as a stand-alone test therefore the rapid antigen negative result has reflexed to a Group A Strep culture. CORRECTED ON 05/23 AT 8119: PREVIOUSLY REPORTED AS NEGATIVE   Culture, Group A Strep     Status: None   Collection Time: 07/26/14  1:30 AM  Result Value Ref Range Status   Strep A Culture Negative  Final    Comment: (NOTE) Performed At: [facility] [address] [place], [place] [id number] [name] MD ZH:[id number]    MRSA PCR Screening     Status: None   Collection Time: 07/29/14 11:20 AM  Result Value Ref Range Status   MRSA by PCR NEGATIVE NEGATIVE Final    Comment:        The GeneXpert MRSA Assay (FDA approved for NASAL specimens only), is one component of a comprehensive MRSA colonization surveillance program. It is not intended to diagnose MRSA infection nor to guide or monitor treatment for MRSA infections.      Basic Metabolic Panel:  Recent Labs  29/52/84 1747 07/30/14 0634  NA 138 139  K 3.8 3.9  CL 100* 98*  CO2 28 33*  GLUCOSE 128* 94  BUN 10 11  CREATININE 1.06 0.99  CALCIUM 8.7* 8.6*   Liver Function Tests:  Recent Labs  07/28/14 1747  AST 38  ALT 42  ALKPHOS 79  BILITOT 0.8  PROT 7.3  ALBUMIN 4.0     CBC:  Recent Labs  07/28/14 1747  WBC 15.5*  NEUTROABS 12.8*  HGB 14.1  HCT 42.8  MCV 88.4  PLT 269    Mr Brain Wo Contrast  07/30/2014   CLINICAL DATA:  Bilateral leg weakness  EXAM: MRI HEAD WITHOUT CONTRAST  TECHNIQUE: Multiplanar, multiecho pulse sequences of the brain and surrounding structures were obtained without intravenous contrast.  COMPARISON:  None.  FINDINGS: Mild restricted diffusion is evident within the a posterior right temporal and right parietal lobe. Increased T2 signal in cortical thickening is noted in the same  areas. There is no underlying hemorrhage. No other focal cortical signal abnormality is evident.  The ventricles are of normal size. No significant white matter disease is present. No significant extra-axial fluid is present. Flow is present in the major intracranial arteries. Globes and orbits are intact. Mild mucosal thickening is present in the maxillary sinuses bilaterally. There is some mucosal thickening in the sphenoid sinuses, worse on the left. The mastoid air cells are clear.  Skullbase is otherwise unremarkable. Midline structures are within normal limits.  IMPRESSION: 1. Focal cortical thickening and T2 signal  involving the posterior right temporal and parietal lobes. There is some restricted diffusion he ear as well. 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 Venous ischemia and reversible cerebrovascular vasoconstriction syndrome are also included in the differential diagnosis. This does not appear to be neoplastic. Recommend post contrast MRI as well as MRA and MR venogram for further evaluation. 2. The remainder the brain is within normal limits. 3. Mild sinus disease. These results were called by telephone at the time of interpretation on 07/30/2014 at 12:22 pm to Dr. Beryle BeamsKOFI DOONQUAH , who verbally acknowledged these results.   Electronically Signed   By: Marin Robertshristopher  Mattern M.D.   On: 07/30/2014 12:32   Mr Cervical Spine Wo Contrast  07/30/2014   CLINICAL DATA:  Bilateral lower extremity weakness.  EXAM: MRI CERVICAL SPINE WITHOUT CONTRAST  TECHNIQUE: Multiplanar, multisequence MR imaging of the cervical spine was performed. No intravenous contrast was administered.  COMPARISON:  Cervical spine MRI 11/20/2011.  FINDINGS: Vertebral body height, signal and alignment are maintained. The craniocervical junction is normal. Cervical cord signal is normal. Imaged paraspinous structures are unremarkable.  C2-3:  Negative.  C3-4:  Negative.  C4-5:  Negative.  C5-6: Small disc osteophyte complex and uncovertebral disease are seen. The central canal is minimally narrowed. Mild to moderate foraminal narrowing appears worse on the left.  C6-7: Shallow disc bulge narrows but does not efface the ventral thecal sac. Uncovertebral disease causes mild foraminal narrowing  C7-T1:  Negative.  IMPRESSION: No acute abnormality or finding to explain the patient's symptoms. No change in degenerative disease.  Mild to moderate foraminal narrowing due to uncovertebral disease at C5-6, worse on the left. Very mild central canal narrowing is also present at this level.  Mild bilateral foraminal narrowing C6-7 due to uncovertebral  disease.   Electronically Signed   By: Drusilla Kannerhomas  Dalessio M.D.   On: 07/30/2014 11:42   Mr Lumbar Spine Wo Contrast  07/29/2014   CLINICAL DATA:  44 year old male with low back pain. Off and on bilateral leg pain for several months. Initial encounter.  EXAM: MRI LUMBAR SPINE WITHOUT CONTRAST  TECHNIQUE: Multiplanar, multisequence MR imaging of the lumbar spine was performed. No intravenous contrast was administered.  COMPARISON:  07/24/2014 CT abdomen pelvis. No comparison lumbar spine MR.  FINDINGS: Exam is motion degraded.  Last fully open disk space is labeled L5-S1. Present examination incorporates from T11 through lower sacrum.  Conus just below the T12-L1 disc space.  Mild curvature of the lumbar spine convex to the left.  Visualized paravertebral structures unremarkable.  T11-12: Minimal Schmorl's node deformity. Minimal to mild bulge. Slight narrowing ventral aspect of the thecal sac with minimal deflection of the cord posteriorly.  T12-L1:  Minimal Schmorl's node deformity.  L1-2:  Negative.  L2-3:  Very small Schmorl's node deformity superior endplate L3.  L3-4: Mild bulge with lateral extension greater to left with minimal contact with the exiting left L3 nerve  root.  L4-5:  Minimal Schmorl's node deformity.  Minimal bulge.  L5-S1:  Minimal facet joint degenerative changes.  IMPRESSION: Minimal to mild degenerative changes without significant spinal stenosis or nerve root compression as detailed above. The L3-4 bulge is slightly asymmetric greater to left contacting but not causing significant compression of the exiting left L3 nerve root.   Electronically Signed   By: Lacy Duverney M.D.   On: 07/29/2014 18:53      Medications:   Impression:  Principal Problem:   Weakness of both legs Active Problems:   Neck pain, chronic   Essential hypertension, benign   Numbness of arm   Anxiety and depression   Conversion disorder     Plan:await MRA as well as post contrast MR V of brain and  neurology input to find out if these are incidental findings for thirst some unifying diagnosis to account for transient lower extremity weakness.  Consultants: neurology   Procedures   Antibiotics:                   Code Status:full   Family Communication:    Disposition Plan see plan above  Time spent:30 minutes   LOS: 2 days   Maudie Shingledecker M   07/30/2014, 1:05 PM

## 2014-07-30 NOTE — Sedation Documentation (Signed)
Patient tolerating procedure well. No distress. Will continue to monitor

## 2014-07-30 NOTE — Progress Notes (Signed)
CSF specimens walked to lab by RN. Patient to MRI for imaging with Dr Marice Potteregister's approval. Puncture site, clean/dry with no drainage noted.

## 2014-07-30 NOTE — Progress Notes (Signed)
Patient ID: ZAN ORLICK, male   DOB: 1970/11/24, 44 y.o.   MRN: [id number]    Warwick A. Merlene Laughter, MD     [email or web address]          Benjamin Vaughn is an 44 y.o. male.   Assessment/Plan: The patient has a constellation of symptoms without any clear unifying diagnosis. His examination is not particularly helpful. He does not have overt signs of myelopathy indicates spinal cord compression. He has normal reflexes argue against neuropathy.   Unexplained increased signal involving the right parietal temporal region limited almost exclusively to the cortical rim. Differential diagnosis includes cerebritis, vasculitis, reversible encephalopathy syndrome and the seizures. EEG will be obtained.  Spinal fluid analysis will be obtained. CBC with differential also be obtained.    Still C/O LBP and leg weekness  GENERAL: Pleasant thin and in no acute distress. Does appears to be in pain. He is anxious appearing.  HEENT: Supple. Atraumatic normocephalic.   ABDOMEN: soft  EXTREMITIES: No edema   BACK:Normal alignment. No discoloration or erythema. Mild soreness low back area.  SKIN: Normal by inspection.   MENTAL STATUS: Alert and oriented. Speech, language and cognition are generally intact. Judgment and insight normal.   CRANIAL NERVES: Pupils are equal, round and reactive to light and accommodation; extra ocular movements are full, there is no significant nystagmus; visual fields are full; upper and lower facial muscles are normal in strength and symmetric, there is no flattening of the nasolabial folds; tongue is midline; uvula is midline; shoulder elevation is normal.  MOTOR: Normal tone, bulk and strength -arms; no pronator drift. Bulk and tone are normal in the legs. Strength 4-/5.  COORDINATION: Left finger to nose is normal, right finger to nose is normal, No rest tremor; no intention tremor; no postural tremor; no bradykinesia.  REFLEXES: Deep tendon  reflexes are symmetrical and normal. Babinski reflexes are flexor bilaterally.   SENSATION:Questionable sensory level at T8 to light touch and temperature.  Brain MRI is reviewed in person. There is increased signal seen on diffusion imaging involving the right parietal temporal region limited to the cortical rim. No mass lesions are observed. There is no white matter lesions seen. Cervical spine MRI shows herniated disc at C4-C5 and C5-C6 causing significant obliteration of the spinal fluid but but will only mild encroachment on the spinal column. The MRV and MRA of the brain also reviewed and are both normal.   Objective: Vital signs in last 24 hours: Temp:  [98.1 F (36.7 C)-98.7 F (37.1 C)] 98.3 F (36.8 C) (05/27 1702) Pulse Rate:  [64-82] 75 (05/27 1702) Resp:  [16-20] 18 (05/27 1702) BP: (108-130)/(65-87) 130/81 mmHg (05/27 1702) SpO2:  [97 %-100 %] 97 % (05/27 1702) Weight:  [63.957 kg (141 lb)] 63.957 kg (141 lb) (05/27 1447)  Intake/Output from previous day: 05/26 0701 - 05/27 0700 In: -  Out: 975 [Urine:975] Intake/Output this shift:   Nutritional status: Diet regular Room service appropriate?: Yes; Fluid consistency:: Thin   Lab Results: Results for orders placed or performed during the hospital encounter of 07/28/14 (from the past 48 hour(s))  HIV antibody     Status: None   Collection Time: 07/28/14  8:14 PM  Result Value Ref Range   HIV Screen 4th Generation wRfx Non Reactive Non Reactive    Comment: (NOTE) Performed At: [facility] [place], [place] [id number] Lindon Romp MD KZ:[id number]   Urinalysis, Routine w reflex microscopic (not at [facility])  Status: None   Collection Time: 07/29/14 12:06 AM  Result Value Ref Range   Color, Urine YELLOW YELLOW   APPearance CLEAR CLEAR   Specific Gravity, Urine 1.025 1.005 - 1.030   pH 6.0 5.0 - 8.0   Glucose, UA NEGATIVE NEGATIVE mg/dL   Hgb urine dipstick NEGATIVE NEGATIVE   Bilirubin  Urine NEGATIVE NEGATIVE   Ketones, ur NEGATIVE NEGATIVE mg/dL   Protein, ur NEGATIVE NEGATIVE mg/dL   Urobilinogen, UA 0.2 0.0 - 1.0 mg/dL   Nitrite NEGATIVE NEGATIVE   Leukocytes, UA NEGATIVE NEGATIVE    Comment: MICROSCOPIC NOT DONE ON URINES WITH NEGATIVE PROTEIN, BLOOD, LEUKOCYTES, NITRITE, OR GLUCOSE <1000 mg/dL.  MRSA PCR Screening     Status: None   Collection Time: 07/29/14 11:20 AM  Result Value Ref Range   MRSA by PCR NEGATIVE NEGATIVE    Comment:        The GeneXpert MRSA Assay (FDA approved for NASAL specimens only), is one component of a comprehensive MRSA colonization surveillance program. It is not intended to diagnose MRSA infection nor to guide or monitor treatment for MRSA infections.   Basic metabolic panel     Status: Abnormal   Collection Time: 07/30/14  6:34 AM  Result Value Ref Range   Sodium 139 135 - 145 mmol/L   Potassium 3.9 3.5 - 5.1 mmol/L   Chloride 98 (L) 101 - 111 mmol/L   CO2 33 (H) 22 - 32 mmol/L   Glucose, Bld 94 65 - 99 mg/dL   BUN 11 6 - 20 mg/dL   Creatinine, Ser 0.99 0.61 - 1.24 mg/dL   Calcium 8.6 (L) 8.9 - 10.3 mg/dL   GFR calc non Af Amer >60 >60 mL/min   GFR calc Af Amer >60 >60 mL/min    Comment: (NOTE) The eGFR has been calculated using the CKD EPI equation. This calculation has not been validated in all clinical situations. eGFR's persistently <60 mL/min signify possible Chronic Kidney Disease.    Anion gap 8 5 - 15  Protein and glucose, CSF     Status: None   Collection Time: 07/30/14  2:18 PM  Result Value Ref Range   Glucose, CSF 70 40 - 70 mg/dL   Total  Protein, CSF 25 15 - 45 mg/dL  CSF cell count with differential     Status: Abnormal   Collection Time: 07/30/14  2:18 PM  Result Value Ref Range   Tube # 2    Color, CSF COLORLESS COLORLESS   Appearance, CSF CLEAR CLEAR   Supernatant NOT INDICATED    RBC Count, CSF 2 (H) 0 /cu mm   WBC, CSF 2 0 - 5 /cu mm    Lipid Panel No results for input(s): CHOL, TRIG,  HDL, CHOLHDL, VLDL, LDLCALC in the last 72 hours.  Studies/Results:  MRA/MRV Unremarkable MRA intracranial and MRV intracranial.  No evidence for vascular etiology for the observed MR brain findings.   Medications:  Scheduled Meds: . amLODipine  5 mg Oral Daily  . gabapentin  300 mg Oral BID  . heparin  5,000 Units Subcutaneous 3 times per day  . lidocaine (PF)       Continuous Infusions:  PRN Meds:.diphenhydrAMINE, LORazepam, oxyCODONE     LOS: 2 days   Benjamin Vaughn, M.D.  Diplomate, [employer] of Psychiatry and Neurology ( Neurology).

## 2014-07-31 ENCOUNTER — Other Ambulatory Visit (HOSPITAL_COMMUNITY): Payer: 59

## 2014-07-31 LAB — CRYPTOCOCCAL ANTIGEN, CSF: CRYPTO AG: NEGATIVE

## 2014-07-31 LAB — BASIC METABOLIC PANEL
ANION GAP: 10 (ref 5–15)
BUN: 13 mg/dL (ref 6–20)
CO2: 33 mmol/L — AB (ref 22–32)
CREATININE: 1.07 mg/dL (ref 0.61–1.24)
Calcium: 9.1 mg/dL (ref 8.9–10.3)
Chloride: 95 mmol/L — ABNORMAL LOW (ref 101–111)
GFR calc Af Amer: >60 mL/min (ref >60)
GFR calc non Af Amer: >60 mL/min (ref >60)
Glucose, Bld: 95 mg/dL (ref 65–99)
POTASSIUM: 4 mmol/L (ref 3.5–5.1)
SODIUM: 138 mmol/L (ref 135–145)

## 2014-07-31 LAB — HERPES SIMPLEX VIRUS(HSV) DNA BY PCR
HSV 1 DNA: NEGATIVE
HSV 2 DNA: NEGATIVE

## 2014-07-31 NOTE — Progress Notes (Signed)
Patient with orders to be discharge home. Discharge instructions given, patient verbalized understanding. Patient stable. Patient left in private vehicle.  
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
## 2014-08-02 LAB — VDRL, CSF: SYPHILIS VDRL QUANT CSF: NONREACTIVE

## 2014-08-03 LAB — CSF CULTURE

## 2014-08-03 LAB — CSF CULTURE W GRAM STAIN
Culture: NO GROWTH
Gram Stain: NONE SEEN

## 2014-08-06 LAB — ANGIOTENSIN CONVERTING ENZYME, CSF: ANGIO CONVERT ENZYME: 1.8 U/L (ref 0.0–2.5)

## 2014-08-17 LAB — B. BURGDORFI ANTIBODIES, CSF
Albumin CSF: 27.1 mg/dL (ref 10.4–43.2)
B. burgdorferi IgM CSF: <0.06
IGG TOTAL CSF: 2.83 mg/dL (ref 0.00–3.06)

## 2014-09-03 ENCOUNTER — Emergency Department (HOSPITAL_COMMUNITY)
Admission: EM | Admit: 2014-09-03 | Discharge: 2014-09-03 | Disposition: A | Discharge status: Home / Self Care | Payer: 59 | Attending: Emergency Medicine | Admitting: Emergency Medicine

## 2014-09-03 ENCOUNTER — Emergency Department (HOSPITAL_COMMUNITY): Payer: 59

## 2014-09-03 ENCOUNTER — Encounter (HOSPITAL_COMMUNITY): Payer: Self-pay | Admitting: Emergency Medicine

## 2014-09-03 DIAGNOSIS — R42 Dizziness and giddiness: Secondary | ICD-10-CM | POA: Diagnosis not present

## 2014-09-03 DIAGNOSIS — Z79899 Other long term (current) drug therapy: Secondary | ICD-10-CM | POA: Diagnosis not present

## 2014-09-03 DIAGNOSIS — Z8739 Personal history of other diseases of the musculoskeletal system and connective tissue: Secondary | ICD-10-CM | POA: Insufficient documentation

## 2014-09-03 DIAGNOSIS — Z87891 Personal history of nicotine dependence: Secondary | ICD-10-CM | POA: Insufficient documentation

## 2014-09-03 DIAGNOSIS — R079 Chest pain, unspecified: Secondary | ICD-10-CM | POA: Diagnosis not present

## 2014-09-03 DIAGNOSIS — I10 Essential (primary) hypertension: Secondary | ICD-10-CM | POA: Insufficient documentation

## 2014-09-03 LAB — CBC
HEMATOCRIT: 42.8 % (ref 39.0–52.0)
Hemoglobin: 14.1 g/dL (ref 13.0–17.0)
MCH: 29.3 pg (ref 26.0–34.0)
MCHC: 32.9 g/dL (ref 30.0–36.0)
MCV: 89 fL (ref 78.0–100.0)
Platelets: 273 10*3/uL (ref 150–400)
RBC: 4.81 MIL/uL (ref 4.22–5.81)
RDW: 13.7 % (ref 11.5–15.5)
WBC: 10.3 10*3/uL (ref 4.0–10.5)

## 2014-09-03 LAB — BASIC METABOLIC PANEL
Anion gap: 11 (ref 5–15)
BUN: 10 mg/dL (ref 6–20)
CALCIUM: 8.9 mg/dL (ref 8.9–10.3)
CHLORIDE: 101 mmol/L (ref 101–111)
CO2: 28 mmol/L (ref 22–32)
CREATININE: 0.93 mg/dL (ref 0.61–1.24)
GFR calc Af Amer: >60 mL/min (ref >60)
GFR calc non Af Amer: >60 mL/min (ref >60)
Glucose, Bld: 111 mg/dL — ABNORMAL HIGH (ref 65–99)
Potassium: 3.5 mmol/L (ref 3.5–5.1)
Sodium: 140 mmol/L (ref 135–145)

## 2014-09-03 LAB — TROPONIN I: Troponin I: <0.03 ng/mL (ref <0.031)

## 2014-09-03 MED ORDER — FAMOTIDINE 20 MG PO TABS: 20.0000 mg | ORAL_TABLET | Freq: Two times a day (BID) | ORAL | Status: DC

## 2014-09-03 MED ORDER — SODIUM CHLORIDE 0.9 % IV BOLUS (SEPSIS)
1000.0000 mL | Freq: Once | INTRAVENOUS | Status: AC
  Administered 2014-09-03: 1000 mL via INTRAVENOUS
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
## 2014-09-03 NOTE — Discharge Instructions (Signed)

## 2014-09-03 NOTE — ED Notes (Signed)
Patient complaining of dizziness x 1 week and chest pain x 2-3 days.

## 2014-09-13 ENCOUNTER — Other Ambulatory Visit (HOSPITAL_COMMUNITY): Payer: Self-pay | Admitting: Respiratory Therapy

## 2014-09-13 DIAGNOSIS — R569 Unspecified convulsions: Secondary | ICD-10-CM

## 2014-10-01 ENCOUNTER — Ambulatory Visit (HOSPITAL_COMMUNITY): Payer: 59

## 2014-10-08 ENCOUNTER — Ambulatory Visit (HOSPITAL_COMMUNITY)
Admission: RE | Admit: 2014-10-08 | Discharge: 2014-10-08 | Disposition: A | Discharge status: Home / Self Care | Payer: 59 | Source: Ambulatory Visit | Attending: Neurology | Admitting: Neurology

## 2014-10-08 DIAGNOSIS — R569 Unspecified convulsions: Secondary | ICD-10-CM | POA: Diagnosis not present

## 2014-10-08 DIAGNOSIS — Z79899 Other long term (current) drug therapy: Secondary | ICD-10-CM | POA: Insufficient documentation

## 2014-10-08 NOTE — Progress Notes (Signed)
OP EEG completed at [facility]; results pending.

## 2014-10-22 NOTE — Procedures (Signed)
  [facility] Krystyn Picking A. Gerilyn Pilgrim, MD     [email or web address]           HISTORY: The patient is a 44 year old presents with seizures.  MEDICATIONS: Scheduled Meds: Continuous Infusions: PRN Meds:.  Prior to Admission medications   Medication Sig Start Date End Date Taking? Authorizing Provider  amLODipine (NORVASC) 5 MG tablet Take 1 tablet (5 mg total) by mouth daily. 01/08/14   Doug Sou, MD  famotidine (PEPCID) 20 MG tablet Take 1 tablet (20 mg total) by mouth 2 (two) times daily. 09/03/14   Linwood Dibbles, MD  gabapentin (NEURONTIN) 300 MG capsule Take 300 mg by mouth 2 (two) times daily.    Historical Provider, MD  loratadine-pseudoephedrine (CLARITIN-D 12 HOUR) 5-120 MG per tablet Take 1 tablet by mouth 2 (two) times daily. 10/16/13   Ivery Quale, PA-C  Multiple Vitamins-Minerals (MULTIVITAMINS THER. W/MINERALS) TABS Take 1 tablet by mouth daily.      Historical Provider, MD  Oxycodone HCl 10 MG TABS Take 10 mg by mouth 3 (three) times daily.    Historical Provider, MD      ANALYSIS: A 16 channel recording using standard 10 20 measurements is conducted for 21 minutes. There is a well-formed posterior dominant rhythm that consists high as 19-20 Hz. The low electrocortical activity shows low voltage. Awake and drowsy activities are recorded. Photic examination and hyperventilation both carried out without abnormal responses. There is no focal or lateral slowing. There is no epileptiform activity is observed.   IMPRESSION: This is a normal recording of the awake and drowsy states.      Oziah Vitanza A. Gerilyn Pilgrim, M.D.  Diplomate, Biomedical engineer of Psychiatry and Neurology ( Neurology).

## 2015-01-17 ENCOUNTER — Emergency Department (HOSPITAL_COMMUNITY): Payer: 59

## 2015-01-17 ENCOUNTER — Encounter (HOSPITAL_COMMUNITY): Payer: Self-pay | Admitting: *Deleted

## 2015-01-17 ENCOUNTER — Emergency Department (HOSPITAL_COMMUNITY)
Admission: EM | Admit: 2015-01-17 | Discharge: 2015-01-17 | Disposition: A | Discharge status: Home / Self Care | Payer: 59 | Attending: Emergency Medicine | Admitting: Emergency Medicine

## 2015-01-17 DIAGNOSIS — Y9289 Other specified places as the place of occurrence of the external cause: Secondary | ICD-10-CM | POA: Diagnosis not present

## 2015-01-17 DIAGNOSIS — Z79899 Other long term (current) drug therapy: Secondary | ICD-10-CM | POA: Insufficient documentation

## 2015-01-17 DIAGNOSIS — Z8719 Personal history of other diseases of the digestive system: Secondary | ICD-10-CM | POA: Insufficient documentation

## 2015-01-17 DIAGNOSIS — Z8781 Personal history of (healed) traumatic fracture: Secondary | ICD-10-CM | POA: Diagnosis not present

## 2015-01-17 DIAGNOSIS — Z8739 Personal history of other diseases of the musculoskeletal system and connective tissue: Secondary | ICD-10-CM | POA: Diagnosis not present

## 2015-01-17 DIAGNOSIS — I10 Essential (primary) hypertension: Secondary | ICD-10-CM | POA: Insufficient documentation

## 2015-01-17 DIAGNOSIS — Y998 Other external cause status: Secondary | ICD-10-CM | POA: Insufficient documentation

## 2015-01-17 DIAGNOSIS — S6991XA Unspecified injury of right wrist, hand and finger(s), initial encounter: Secondary | ICD-10-CM | POA: Diagnosis present

## 2015-01-17 DIAGNOSIS — S60221A Contusion of right hand, initial encounter: Secondary | ICD-10-CM

## 2015-01-17 DIAGNOSIS — Z87891 Personal history of nicotine dependence: Secondary | ICD-10-CM | POA: Insufficient documentation

## 2015-01-17 DIAGNOSIS — S29001A Unspecified injury of muscle and tendon of front wall of thorax, initial encounter: Secondary | ICD-10-CM | POA: Diagnosis not present

## 2015-01-17 DIAGNOSIS — Y9389 Activity, other specified: Secondary | ICD-10-CM | POA: Insufficient documentation

## 2015-01-17 MED ORDER — TRAMADOL HCL 50 MG PO TABS: 50.0000 mg | ORAL_TABLET | Freq: Four times a day (QID) | ORAL | Status: DC | PRN

## 2015-01-17 MED ORDER — TRAMADOL HCL 50 MG PO TABS
50.0000 mg | ORAL_TABLET | Freq: Once | ORAL | Status: AC
  Administered 2015-01-17: 50 mg via ORAL
  Filled 2015-01-17: qty 1
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
## 2015-01-17 NOTE — Discharge Instructions (Signed)
Follow up with your md as planned °
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
## 2015-05-19 ENCOUNTER — Emergency Department (HOSPITAL_COMMUNITY)
Admission: EM | Admit: 2015-05-19 | Discharge: 2015-05-19 | Disposition: A | Discharge status: Home / Self Care | Payer: Self-pay | Attending: Emergency Medicine | Admitting: Emergency Medicine

## 2015-05-19 ENCOUNTER — Encounter (HOSPITAL_COMMUNITY): Payer: Self-pay | Admitting: Emergency Medicine

## 2015-05-19 DIAGNOSIS — X12XXXA Contact with other hot fluids, initial encounter: Secondary | ICD-10-CM | POA: Insufficient documentation

## 2015-05-19 DIAGNOSIS — Z87891 Personal history of nicotine dependence: Secondary | ICD-10-CM | POA: Insufficient documentation

## 2015-05-19 DIAGNOSIS — T310 Burns involving less than 10% of body surface: Secondary | ICD-10-CM | POA: Insufficient documentation

## 2015-05-19 DIAGNOSIS — I10 Essential (primary) hypertension: Secondary | ICD-10-CM | POA: Insufficient documentation

## 2015-05-19 DIAGNOSIS — Y929 Unspecified place or not applicable: Secondary | ICD-10-CM | POA: Insufficient documentation

## 2015-05-19 DIAGNOSIS — Z79899 Other long term (current) drug therapy: Secondary | ICD-10-CM | POA: Insufficient documentation

## 2015-05-19 DIAGNOSIS — T23201A Burn of second degree of right hand, unspecified site, initial encounter: Secondary | ICD-10-CM | POA: Insufficient documentation

## 2015-05-19 DIAGNOSIS — Y939 Activity, unspecified: Secondary | ICD-10-CM | POA: Insufficient documentation

## 2015-05-19 DIAGNOSIS — Y999 Unspecified external cause status: Secondary | ICD-10-CM | POA: Insufficient documentation

## 2015-05-19 MED ORDER — TETANUS-DIPHTH-ACELL PERTUSSIS 5-2.5-18.5 LF-MCG/0.5 IM SUSP
0.5000 mL | Freq: Once | INTRAMUSCULAR | Status: AC
  Administered 2015-05-19: 0.5 mL via INTRAMUSCULAR
  Filled 2015-05-19: qty 0.5

## 2015-05-19 MED ORDER — BACITRACIN ZINC 500 UNIT/GM EX OINT: 1.0000 "application " | TOPICAL_OINTMENT | Freq: Two times a day (BID) | CUTANEOUS | Status: DC

## 2015-05-19 MED ORDER — BACITRACIN ZINC 500 UNIT/GM EX OINT
1.0000 "application " | TOPICAL_OINTMENT | Freq: Two times a day (BID) | CUTANEOUS | Status: DC
  Administered 2015-05-19: 1 via TOPICAL
  Filled 2015-05-19: qty 0.9
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
## 2015-05-19 NOTE — Discharge Instructions (Signed)
Burn Care °Burns hurt your skin. When your skin is hurt, it is easier to get an infection. Follow your doctor's directions to help prevent an infection. °HOME CARE °· Wash your hands well before you change your bandage. °· Change your bandage as often as told by your doctor. °¨ Remove the old bandage. If the bandage sticks, soak it off with cool, clean water. °¨ Gently clean the burn with mild soap and water. °¨ Pat the burn dry with a clean, dry cloth. °¨ Put a thin layer of medicated cream on the burn. °¨ Put a clean bandage on as told by your doctor. °¨ Keep the bandage clean and dry. °· Raise (elevate) the burn for the first 24 hours. After that, follow your doctor's directions. °· Only take medicine as told by your doctor. °GET HELP RIGHT AWAY IF:  °· You have too much pain. °· The skin near the burn is red, tender, puffy (swollen), or has red streaks. °· The burn area has yellowish white fluid (pus) or a bad smell coming from it. °· You have a fever. °MAKE SURE YOU:  °· Understand these instructions. °· Will watch your condition. °· Will get help right away if you are not doing well or get worse. °  °This information is not intended to replace advice given to you by your health care provider. Make sure you discuss any questions you have with your health care provider. °  °Document Released: 11/29/2007 Document Revised: 05/14/2011 Document Reviewed: 07/12/2010 °Elsevier Interactive Patient Education ©2016 [employer] ° °
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
## 2015-05-27 ENCOUNTER — Emergency Department (HOSPITAL_COMMUNITY)
Admission: EM | Admit: 2015-05-27 | Discharge: 2015-05-27 | Disposition: A | Discharge status: Home / Self Care | Payer: Self-pay | Attending: Emergency Medicine | Admitting: Emergency Medicine

## 2015-05-27 ENCOUNTER — Encounter (HOSPITAL_COMMUNITY): Payer: Self-pay | Admitting: Emergency Medicine

## 2015-05-27 DIAGNOSIS — Z87891 Personal history of nicotine dependence: Secondary | ICD-10-CM | POA: Insufficient documentation

## 2015-05-27 DIAGNOSIS — L0102 Bockhart's impetigo: Secondary | ICD-10-CM | POA: Insufficient documentation

## 2015-05-27 DIAGNOSIS — L739 Follicular disorder, unspecified: Secondary | ICD-10-CM

## 2015-05-27 DIAGNOSIS — I10 Essential (primary) hypertension: Secondary | ICD-10-CM | POA: Insufficient documentation

## 2015-05-27 MED ORDER — DOXYCYCLINE HYCLATE 100 MG PO CAPS: 100.0000 mg | ORAL_CAPSULE | Freq: Two times a day (BID) | ORAL | Status: DC

## 2015-05-27 MED ORDER — DOXYCYCLINE HYCLATE 100 MG PO TABS
100.0000 mg | ORAL_TABLET | Freq: Once | ORAL | Status: AC
  Administered 2015-05-27: 100 mg via ORAL
  Filled 2015-05-27: qty 1
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
## 2015-05-27 NOTE — ED Notes (Signed)
PT c/o red/swollen area with no drainage to right forearm and redness/swelling to left hand middle finger x3 days.

## 2015-05-29 ENCOUNTER — Encounter (HOSPITAL_COMMUNITY): Payer: Self-pay

## 2015-05-29 ENCOUNTER — Emergency Department (HOSPITAL_COMMUNITY)
Admission: EM | Admit: 2015-05-29 | Discharge: 2015-05-30 | Disposition: A | Discharge status: Home / Self Care | Payer: Self-pay | Attending: Emergency Medicine | Admitting: Emergency Medicine

## 2015-05-29 DIAGNOSIS — I10 Essential (primary) hypertension: Secondary | ICD-10-CM | POA: Insufficient documentation

## 2015-05-29 DIAGNOSIS — Z87891 Personal history of nicotine dependence: Secondary | ICD-10-CM | POA: Insufficient documentation

## 2015-05-29 DIAGNOSIS — Z79899 Other long term (current) drug therapy: Secondary | ICD-10-CM | POA: Insufficient documentation

## 2015-05-29 DIAGNOSIS — L03113 Cellulitis of right upper limb: Secondary | ICD-10-CM

## 2015-05-29 DIAGNOSIS — L03011 Cellulitis of right finger: Secondary | ICD-10-CM | POA: Insufficient documentation

## 2015-05-29 MED ORDER — CEPHALEXIN 500 MG PO CAPS: 500.0000 mg | ORAL_CAPSULE | Freq: Four times a day (QID) | ORAL | Status: DC

## 2015-05-29 MED ORDER — VANCOMYCIN HCL IN DEXTROSE 1-5 GM/200ML-% IV SOLN
1000.0000 mg | Freq: Once | INTRAVENOUS | Status: AC
  Administered 2015-05-29: 1000 mg via INTRAVENOUS
  Filled 2015-05-29: qty 200
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
## 2015-05-29 NOTE — ED Notes (Signed)
Patient complaining of abscess to right forearm and left middle finger, was seen on Thursday for the same and they told me to return if not better.

## 2015-05-29 NOTE — Discharge Instructions (Signed)
Cellulitis °Cellulitis is an infection of the skin and the tissue under the skin. The infected area is usually red and tender. This happens most often in the arms and lower legs. °HOME CARE  °· Take your antibiotic medicine as told. Finish the medicine even if you start to feel better. °· Keep the infected arm or leg raised (elevated). °· Put a warm cloth on the area up to 4 times per day. °· Only take medicines as told by your doctor. °· Keep all doctor visits as told. °GET HELP IF: °· You see red streaks on the skin coming from the infected area. °· Your red area gets bigger or turns a dark color. °· Your bone or joint under the infected area is painful after the skin heals. °· Your infection comes back in the same area or different area. °· You have a puffy (swollen) bump in the infected area. °· You have new symptoms. °· You have a fever. °GET HELP RIGHT AWAY IF:  °· You feel very sleepy. °· You throw up (vomit) or have watery poop (diarrhea). °· You feel sick and have muscle aches and pains. °  °This information is not intended to replace advice given to you by your health care provider. Make sure you discuss any questions you have with your health care provider. °  °Document Released: 08/08/2007 Document Revised: 11/10/2014 Document Reviewed: 05/07/2011 °Elsevier Interactive Patient Education ©2016 Elsevier Inc. ° °

## 2015-05-30 NOTE — ED Notes (Signed)
Pt states understanding of care given and follow up instructions.  Ambulated from ED  

## 2015-05-31 ENCOUNTER — Encounter (HOSPITAL_COMMUNITY): Payer: Self-pay | Admitting: Emergency Medicine

## 2015-05-31 ENCOUNTER — Emergency Department (HOSPITAL_COMMUNITY)
Admission: EM | Admit: 2015-05-31 | Discharge: 2015-05-31 | Disposition: A | Discharge status: Home / Self Care | Payer: Self-pay | Attending: Emergency Medicine | Admitting: Emergency Medicine

## 2015-05-31 DIAGNOSIS — Z4801 Encounter for change or removal of surgical wound dressing: Secondary | ICD-10-CM | POA: Insufficient documentation

## 2015-05-31 DIAGNOSIS — Z5189 Encounter for other specified aftercare: Secondary | ICD-10-CM

## 2015-05-31 DIAGNOSIS — I10 Essential (primary) hypertension: Secondary | ICD-10-CM | POA: Insufficient documentation

## 2015-05-31 DIAGNOSIS — M549 Dorsalgia, unspecified: Secondary | ICD-10-CM | POA: Insufficient documentation

## 2015-05-31 DIAGNOSIS — Z87891 Personal history of nicotine dependence: Secondary | ICD-10-CM | POA: Insufficient documentation
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
## 2015-05-31 NOTE — ED Notes (Signed)
Here Friday and Sunday for wound check.  Told to return in 2 days for wound check.  Skin markings noted to right wrist and area noted to left middle finger.

## 2015-05-31 NOTE — Discharge Instructions (Signed)
Please continue warm soaks to the wrist area. Apply a Band-Aid to the area until the raised blistered area has healed. Please finish your antibiotics. Please see Dr. Delbert Harnesson Diego for recheck if not improving. Wound Care Taking care of your wound properly can help to prevent pain and infection. It can also help your wound to heal more quickly.  HOW TO CARE FOR YOUR WOUND  Take or apply over-the-counter and prescription medicines only as told by your health care provider.  If you were prescribed antibiotic medicine, take or apply it as told by your health care provider. Do not stop using the antibiotic even if your condition improves.  Clean the wound each day or as told by your health care provider.  Wash the wound with mild soap and water.  Rinse the wound with water to remove all soap.  Pat the wound dry with a clean towel. Do not rub it.  There are many different ways to close and cover a wound. For example, a wound can be covered with stitches (sutures), skin glue, or adhesive strips. Follow instructions from your health care provider about:  How to take care of your wound.  When and how you should change your bandage (dressing).  When you should remove your dressing.  Removing whatever was used to close your wound.  Check your wound every day for signs of infection. Watch for:  Redness, swelling, or pain.  Fluid, blood, or pus.  Keep the dressing dry until your health care provider says it can be removed. Do not take baths, swim, use a hot tub, or do anything that would put your wound underwater until your health care provider approves.  Raise (elevate) the injured area above the level of your heart while you are sitting or lying down.  Do not scratch or pick at the wound.  Keep all follow-up visits as told by your health care provider. This is important. SEEK MEDICAL CARE IF:  You received a tetanus shot and you have swelling, severe pain, redness, or bleeding at the  injection site.  You have a fever.  Your pain is not controlled with medicine.  You have increased redness, swelling, or pain at the site of your wound.  You have fluid, blood, or pus coming from your wound.  You notice a bad smell coming from your wound or your dressing. SEEK IMMEDIATE MEDICAL CARE IF:  You have a red streak going away from your wound.   This information is not intended to replace advice given to you by your health care provider. Make sure you discuss any questions you have with your health care provider.   Document Released: 11/29/2007 Document Revised: 07/06/2014 Document Reviewed: 02/15/2014 Elsevier Interactive Patient Education [employer]2016 Elsevier Inc.

## 2015-08-12 ENCOUNTER — Encounter (HOSPITAL_COMMUNITY): Payer: Self-pay

## 2015-08-12 ENCOUNTER — Emergency Department (HOSPITAL_COMMUNITY): Payer: Self-pay

## 2015-08-12 ENCOUNTER — Emergency Department (HOSPITAL_COMMUNITY)
Admission: EM | Admit: 2015-08-12 | Discharge: 2015-08-12 | Disposition: A | Discharge status: Home / Self Care | Payer: Self-pay | Attending: Emergency Medicine | Admitting: Emergency Medicine

## 2015-08-12 DIAGNOSIS — Z792 Long term (current) use of antibiotics: Secondary | ICD-10-CM | POA: Insufficient documentation

## 2015-08-12 DIAGNOSIS — H6123 Impacted cerumen, bilateral: Secondary | ICD-10-CM | POA: Insufficient documentation

## 2015-08-12 DIAGNOSIS — R42 Dizziness and giddiness: Secondary | ICD-10-CM

## 2015-08-12 DIAGNOSIS — I10 Essential (primary) hypertension: Secondary | ICD-10-CM | POA: Insufficient documentation

## 2015-08-12 DIAGNOSIS — R0789 Other chest pain: Secondary | ICD-10-CM | POA: Insufficient documentation

## 2015-08-12 DIAGNOSIS — Z87891 Personal history of nicotine dependence: Secondary | ICD-10-CM | POA: Insufficient documentation

## 2015-08-12 DIAGNOSIS — Z79899 Other long term (current) drug therapy: Secondary | ICD-10-CM | POA: Insufficient documentation

## 2015-08-12 LAB — CBC WITH DIFFERENTIAL/PLATELET
BASOS PCT: 0 %
Basophils Absolute: 0 10*3/uL (ref 0.0–0.1)
EOS ABS: 0.2 10*3/uL (ref 0.0–0.7)
EOS PCT: 2 %
HCT: 39 % (ref 39.0–52.0)
Hemoglobin: 12.7 g/dL — ABNORMAL LOW (ref 13.0–17.0)
Lymphocytes Relative: 23 %
Lymphs Abs: 2.3 10*3/uL (ref 0.7–4.0)
MCH: 29.1 pg (ref 26.0–34.0)
MCHC: 32.6 g/dL (ref 30.0–36.0)
MCV: 89.4 fL (ref 78.0–100.0)
MONO ABS: 0.8 10*3/uL (ref 0.1–1.0)
MONOS PCT: 9 %
Neutro Abs: 6.6 10*3/uL (ref 1.7–7.7)
Neutrophils Relative %: 66 %
PLATELETS: 325 10*3/uL (ref 150–400)
RBC: 4.36 MIL/uL (ref 4.22–5.81)
RDW: 13.2 % (ref 11.5–15.5)
WBC: 9.9 10*3/uL (ref 4.0–10.5)

## 2015-08-12 LAB — BASIC METABOLIC PANEL
Anion gap: 7 (ref 5–15)
BUN: 15 mg/dL (ref 6–20)
CHLORIDE: 100 mmol/L — AB (ref 101–111)
CO2: 30 mmol/L (ref 22–32)
Calcium: 8.8 mg/dL — ABNORMAL LOW (ref 8.9–10.3)
Creatinine, Ser: 1.24 mg/dL (ref 0.61–1.24)
GFR calc Af Amer: >60 mL/min (ref >60)
Glucose, Bld: 112 mg/dL — ABNORMAL HIGH (ref 65–99)
Potassium: 3.8 mmol/L (ref 3.5–5.1)
SODIUM: 137 mmol/L (ref 135–145)

## 2015-08-12 LAB — TROPONIN I: Troponin I: <0.03 ng/mL (ref <0.031)

## 2015-08-12 MED ORDER — DIAZEPAM 5 MG PO TABS
5.0000 mg | ORAL_TABLET | Freq: Once | ORAL | Status: AC
  Administered 2015-08-12: 5 mg via ORAL
  Filled 2015-08-12: qty 1

## 2015-08-12 MED ORDER — SODIUM CHLORIDE 0.9 % IV BOLUS (SEPSIS)
1000.0000 mL | Freq: Once | INTRAVENOUS | Status: AC
  Administered 2015-08-12: 1000 mL via INTRAVENOUS

## 2015-08-12 MED ORDER — DIAZEPAM 5 MG PO TABS: 5.0000 mg | ORAL_TABLET | Freq: Three times a day (TID) | ORAL | Status: DC | PRN
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
## 2015-08-12 NOTE — Discharge Instructions (Signed)
Benign Positional Vertigo °Vertigo is the feeling that you or your surroundings are moving when they are not. Benign positional vertigo is the most common form of vertigo. The cause of this condition is not serious (is benign). This condition is triggered by certain movements and positions (is positional). This condition can be dangerous if it occurs while you are doing something that could endanger you or others, such as driving.  °CAUSES °In many cases, the cause of this condition is not known. It may be caused by a disturbance in an area of the inner ear that helps your brain to sense movement and balance. This disturbance can be caused by a viral infection (labyrinthitis), head injury, or repetitive motion. °RISK FACTORS °This condition is more likely to develop in: °· Women. °· People who are 50 years of age or older. °SYMPTOMS °Symptoms of this condition usually happen when you move your head or your eyes in different directions. Symptoms may start suddenly, and they usually last for less than a minute. Symptoms may include: °· Loss of balance and falling. °· Feeling like you are spinning or moving. °· Feeling like your surroundings are spinning or moving. °· Nausea and vomiting. °· Blurred vision. °· Dizziness. °· Involuntary eye movement (nystagmus). °Symptoms can be mild and cause only slight annoyance, or they can be severe and interfere with daily life. Episodes of benign positional vertigo may return (recur) over time, and they may be triggered by certain movements. Symptoms may improve over time. °DIAGNOSIS °This condition is usually diagnosed by medical history and a physical exam of the head, neck, and ears. You may be referred to a health care provider who specializes in ear, nose, and throat (ENT) problems (otolaryngologist) or a provider who specializes in disorders of the nervous system (neurologist). You may have additional testing, including: °· MRI. °· A CT scan. °· Eye movement tests. Your  health care provider may ask you to change positions quickly while he or she watches you for symptoms of benign positional vertigo, such as nystagmus. Eye movement may be tested with an electronystagmogram (ENG), caloric stimulation, the Dix-Hallpike test, or the roll test. °· An electroencephalogram (EEG). This records electrical activity in your brain. °· Hearing tests. °TREATMENT °Usually, your health care provider will treat this by moving your head in specific positions to adjust your inner ear back to normal. Surgery may be needed in severe cases, but this is rare. In some cases, benign positional vertigo may resolve on its own in 2-4 weeks. °HOME CARE INSTRUCTIONS °Safety °· Move slowly. Avoid sudden body or head movements. °· Avoid driving. °· Avoid operating heavy machinery. °· Avoid doing any tasks that would be dangerous to you or others if a vertigo episode would occur. °· If you have trouble walking or keeping your balance, try using a cane for stability. If you feel dizzy or unstable, sit down right away. °· Return to your normal activities as told by your health care provider. Ask your health care provider what activities are safe for you. °General Instructions °· Take over-the-counter and prescription medicines only as told by your health care provider. °· Avoid certain positions or movements as told by your health care provider. °· Drink enough fluid to keep your urine clear or pale yellow. °· Keep all follow-up visits as told by your health care provider. This is important. °SEEK MEDICAL CARE IF: °· You have a fever. °· Your condition gets worse or you develop new symptoms. °· Your family or friends   notice any behavioral changes.  Your nausea or vomiting gets worse.  You have numbness or a "pins and needles" sensation. SEEK IMMEDIATE MEDICAL CARE IF:  You have difficulty speaking or moving.  You are always dizzy.  You faint.  You develop severe headaches.  You have weakness in your  legs or arms.  You have changes in your hearing or vision.  You develop a stiff neck.  You develop sensitivity to light.   This information is not intended to replace advice given to you by your health care provider. Make sure you discuss any questions you have with your health care provider.   Document Released: 11/27/2005 Document Revised: 11/10/2014 Document Reviewed: 06/14/2014 Elsevier Interactive Patient Education 2016 [employer]  Chest Wall Pain Chest wall pain is pain in or around the bones and muscles of your chest. Sometimes, an injury causes this pain. Sometimes, the cause may not be known. This pain may take several weeks or longer to get better. HOME CARE Pay attention to any changes in your symptoms. Take these actions to help with your pain:  Rest as told by your doctor.  Avoid activities that cause pain. Try not to use your chest, belly (abdominal), or side muscles to lift heavy things.  If directed, apply ice to the painful area:  Put ice in a plastic bag.  Place a towel between your skin and the bag.  Leave the ice on for 20 minutes, 2-3 times per day.  Take over-the-counter and prescription medicines only as told by your doctor.  Do not use tobacco products, including cigarettes, chewing tobacco, and e-cigarettes. If you need help quitting, ask your doctor.  Keep all follow-up visits as told by your doctor. This is important. GET HELP IF:  You have a fever.  Your chest pain gets worse.  You have new symptoms. GET HELP RIGHT AWAY IF:  You feel sick to your stomach (nauseous) or you throw up (vomit).  You feel sweaty or light-headed.  You have a cough with phlegm (sputum) or you cough up blood.  You are short of breath.   This information is not intended to replace advice given to you by your health care provider. Make sure you discuss any questions you have with your health care provider.   Document Released: 08/08/2007 Document Revised:  11/10/2014 Document Reviewed: 05/17/2014 Elsevier Interactive Patient Education 2016 [employer]  Cerumen Impaction The structures of the external ear canal secrete a waxy substance known as cerumen. Excess cerumen can build up in the ear canal, causing a condition known as cerumen impaction. Cerumen impaction can cause ear pain and disrupt the function of the ear. The rate of cerumen production differs for each individual. In certain individuals, the configuration of the ear canal may decrease his or her ability to naturally remove cerumen. CAUSES Cerumen impaction is caused by excessive cerumen production or buildup. RISK FACTORS  Frequent use of swabs to clean ears.  Having narrow ear canals.  Having eczema.  Being dehydrated. SIGNS AND SYMPTOMS  Diminished hearing.  Ear drainage.  Ear pain.  Ear itch. TREATMENT Treatment may involve:  Over-the-counter or prescription ear drops to soften the cerumen.  Removal of cerumen by a health care provider. This may be done with:  Irrigation with warm water. This is the most common method of removal.  Ear curettes and other instruments.  Surgery. This may be done in severe cases. HOME CARE INSTRUCTIONS  Take medicines only as directed by your health care  provider.  Do not insert objects into the ear with the intent of cleaning the ear. PREVENTION  Do not insert objects into the ear, even with the intent of cleaning the ear. Removing cerumen as a part of normal hygiene is not necessary, and the use of swabs in the ear canal is not recommended.  Drink enough water to keep your urine clear or pale yellow.  Control your eczema if you have it. SEEK MEDICAL CARE IF:  You develop ear pain.  You develop bleeding from the ear.  The cerumen does not clear after you use ear drops as directed.   This information is not intended to replace advice given to you by your health care provider. Make sure you discuss any questions  you have with your health care provider.   Document Released: 03/29/2004 Document Revised: 03/12/2014 Document Reviewed: 10/06/2014 Elsevier Interactive Patient Education [employer]

## 2015-08-12 NOTE — ED Notes (Signed)
Pt states he has been dizzy for 2 weeks, has seen his doctor for same and given meclizine without relief.  Pt states he came tonight because he "wants to find out what it is"  Pt also c/o chest tightness x couple of days.  Pt also states he is weak.

## 2015-09-17 ENCOUNTER — Encounter (HOSPITAL_COMMUNITY): Payer: Self-pay | Admitting: Emergency Medicine

## 2015-09-17 ENCOUNTER — Emergency Department (HOSPITAL_COMMUNITY)
Admission: EM | Admit: 2015-09-17 | Discharge: 2015-09-17 | Disposition: A | Discharge status: Home / Self Care | Payer: Self-pay | Attending: Emergency Medicine | Admitting: Emergency Medicine

## 2015-09-17 DIAGNOSIS — L03116 Cellulitis of left lower limb: Secondary | ICD-10-CM | POA: Insufficient documentation

## 2015-09-17 DIAGNOSIS — E860 Dehydration: Secondary | ICD-10-CM | POA: Insufficient documentation

## 2015-09-17 DIAGNOSIS — L03115 Cellulitis of right lower limb: Secondary | ICD-10-CM | POA: Insufficient documentation

## 2015-09-17 DIAGNOSIS — L0390 Cellulitis, unspecified: Secondary | ICD-10-CM

## 2015-09-17 DIAGNOSIS — L03113 Cellulitis of right upper limb: Secondary | ICD-10-CM | POA: Insufficient documentation

## 2015-09-17 DIAGNOSIS — I10 Essential (primary) hypertension: Secondary | ICD-10-CM | POA: Insufficient documentation

## 2015-09-17 DIAGNOSIS — Z79899 Other long term (current) drug therapy: Secondary | ICD-10-CM | POA: Insufficient documentation

## 2015-09-17 DIAGNOSIS — Z87891 Personal history of nicotine dependence: Secondary | ICD-10-CM | POA: Insufficient documentation

## 2015-09-17 HISTORY — DX: Methicillin resistant Staphylococcus aureus infection, unspecified site: A49.02

## 2015-09-17 LAB — CBC WITH DIFFERENTIAL/PLATELET
BASOS PCT: 0 %
Basophils Absolute: 0 10*3/uL (ref 0.0–0.1)
EOS ABS: 0.6 10*3/uL (ref 0.0–0.7)
Eosinophils Relative: 7 %
HCT: 37.3 % — ABNORMAL LOW (ref 39.0–52.0)
Hemoglobin: 12.1 g/dL — ABNORMAL LOW (ref 13.0–17.0)
Lymphocytes Relative: 16 %
Lymphs Abs: 1.3 10*3/uL (ref 0.7–4.0)
MCH: 28.9 pg (ref 26.0–34.0)
MCHC: 32.4 g/dL (ref 30.0–36.0)
MCV: 89 fL (ref 78.0–100.0)
MONO ABS: 0.9 10*3/uL (ref 0.1–1.0)
Monocytes Relative: 11 %
Neutro Abs: 5.6 10*3/uL (ref 1.7–7.7)
Neutrophils Relative %: 66 %
Platelets: 236 10*3/uL (ref 150–400)
RBC: 4.19 MIL/uL — ABNORMAL LOW (ref 4.22–5.81)
RDW: 13.3 % (ref 11.5–15.5)
WBC: 8.4 10*3/uL (ref 4.0–10.5)

## 2015-09-17 LAB — COMPREHENSIVE METABOLIC PANEL
ALBUMIN: 3.5 g/dL (ref 3.5–5.0)
ALT: 21 U/L (ref 17–63)
ANION GAP: 4 — AB (ref 5–15)
AST: 26 U/L (ref 15–41)
Alkaline Phosphatase: 60 U/L (ref 38–126)
BILIRUBIN TOTAL: 0.7 mg/dL (ref 0.3–1.2)
BUN: 10 mg/dL (ref 6–20)
CALCIUM: 8.2 mg/dL — AB (ref 8.9–10.3)
CO2: 30 mmol/L (ref 22–32)
Chloride: 102 mmol/L (ref 101–111)
Creatinine, Ser: 0.91 mg/dL (ref 0.61–1.24)
GFR calc Af Amer: >60 mL/min (ref >60)
GFR calc non Af Amer: >60 mL/min (ref >60)
GLUCOSE: 98 mg/dL (ref 65–99)
Potassium: 3.5 mmol/L (ref 3.5–5.1)
SODIUM: 136 mmol/L (ref 135–145)
TOTAL PROTEIN: 6.9 g/dL (ref 6.5–8.1)

## 2015-09-17 LAB — URINALYSIS, ROUTINE W REFLEX MICROSCOPIC
BILIRUBIN URINE: NEGATIVE
GLUCOSE, UA: NEGATIVE mg/dL
HGB URINE DIPSTICK: NEGATIVE
KETONES UR: NEGATIVE mg/dL
Leukocytes, UA: NEGATIVE
NITRITE: NEGATIVE
Protein, ur: NEGATIVE mg/dL
SPECIFIC GRAVITY, URINE: 1.01 (ref 1.005–1.030)
pH: 6 (ref 5.0–8.0)

## 2015-09-17 MED ORDER — SODIUM CHLORIDE 0.9 % IV BOLUS (SEPSIS)
1000.0000 mL | Freq: Once | INTRAVENOUS | Status: AC
  Administered 2015-09-17: 1000 mL via INTRAVENOUS

## 2015-09-17 MED ORDER — DOXYCYCLINE HYCLATE 100 MG PO CAPS: 100.0000 mg | ORAL_CAPSULE | Freq: Two times a day (BID) | ORAL | Status: DC

## 2015-09-17 MED ORDER — VANCOMYCIN HCL IN DEXTROSE 1-5 GM/200ML-% IV SOLN
1000.0000 mg | Freq: Once | INTRAVENOUS | Status: AC
  Administered 2015-09-17: 1000 mg via INTRAVENOUS
  Filled 2015-09-17: qty 200

## 2015-09-17 NOTE — Discharge Instructions (Signed)
Increase fluids. Keep skin very clean with antibacterial soap. Antibiotic twice a day for 10 days. Keep your skin covered secondary to sun sensitivity with antibiotics

## 2015-09-17 NOTE — ED Notes (Signed)
Patient has multiple complaints. Patient reports going over 12 hours with only voiding once. Patient concerned because he has had kidney failure in past. 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 Patient also c/o multiple abscess to arms. Patient states that he has hx of MRSA. Patient also requesting to have liver and gallbladder due to some previous abnormalities.
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
## 2015-11-03 ENCOUNTER — Emergency Department (HOSPITAL_COMMUNITY): Payer: Self-pay

## 2015-11-03 ENCOUNTER — Encounter (HOSPITAL_COMMUNITY): Payer: Self-pay | Admitting: Cardiology

## 2015-11-03 ENCOUNTER — Emergency Department (HOSPITAL_COMMUNITY)
Admission: EM | Admit: 2015-11-03 | Discharge: 2015-11-03 | Disposition: A | Discharge status: Home / Self Care | Payer: Self-pay | Attending: Emergency Medicine | Admitting: Emergency Medicine

## 2015-11-03 DIAGNOSIS — I10 Essential (primary) hypertension: Secondary | ICD-10-CM | POA: Insufficient documentation

## 2015-11-03 DIAGNOSIS — L03115 Cellulitis of right lower limb: Secondary | ICD-10-CM | POA: Insufficient documentation

## 2015-11-03 DIAGNOSIS — L0291 Cutaneous abscess, unspecified: Secondary | ICD-10-CM

## 2015-11-03 DIAGNOSIS — Z79899 Other long term (current) drug therapy: Secondary | ICD-10-CM | POA: Insufficient documentation

## 2015-11-03 DIAGNOSIS — L02415 Cutaneous abscess of right lower limb: Secondary | ICD-10-CM | POA: Insufficient documentation

## 2015-11-03 DIAGNOSIS — L0390 Cellulitis, unspecified: Secondary | ICD-10-CM

## 2015-11-03 DIAGNOSIS — Z87891 Personal history of nicotine dependence: Secondary | ICD-10-CM | POA: Insufficient documentation

## 2015-11-03 LAB — CBC WITH DIFFERENTIAL/PLATELET
Basophils Absolute: 0 10*3/uL (ref 0.0–0.1)
Basophils Relative: 0 %
EOS ABS: 0.2 10*3/uL (ref 0.0–0.7)
Eosinophils Relative: 1 %
HCT: 39.9 % (ref 39.0–52.0)
HEMOGLOBIN: 13.3 g/dL (ref 13.0–17.0)
LYMPHS ABS: 1.7 10*3/uL (ref 0.7–4.0)
Lymphocytes Relative: 14 %
MCH: 29.2 pg (ref 26.0–34.0)
MCHC: 33.3 g/dL (ref 30.0–36.0)
MCV: 87.7 fL (ref 78.0–100.0)
MONO ABS: 1.3 10*3/uL — AB (ref 0.1–1.0)
MONOS PCT: 10 %
NEUTROS PCT: 75 %
Neutro Abs: 9.5 10*3/uL — ABNORMAL HIGH (ref 1.7–7.7)
Platelets: 315 10*3/uL (ref 150–400)
RBC: 4.55 MIL/uL (ref 4.22–5.81)
RDW: 13.3 % (ref 11.5–15.5)
WBC: 12.6 10*3/uL — ABNORMAL HIGH (ref 4.0–10.5)

## 2015-11-03 LAB — BASIC METABOLIC PANEL
Anion gap: 8 (ref 5–15)
BUN: 13 mg/dL (ref 6–20)
CHLORIDE: 101 mmol/L (ref 101–111)
CO2: 29 mmol/L (ref 22–32)
CREATININE: 0.96 mg/dL (ref 0.61–1.24)
Calcium: 8.7 mg/dL — ABNORMAL LOW (ref 8.9–10.3)
GFR calc Af Amer: >60 mL/min (ref >60)
GFR calc non Af Amer: >60 mL/min (ref >60)
Glucose, Bld: 88 mg/dL (ref 65–99)
Potassium: 4.1 mmol/L (ref 3.5–5.1)
SODIUM: 138 mmol/L (ref 135–145)

## 2015-11-03 MED ORDER — IBUPROFEN 800 MG PO TABS
800.0000 mg | ORAL_TABLET | Freq: Once | ORAL | Status: AC
  Administered 2015-11-03: 800 mg via ORAL
  Filled 2015-11-03: qty 1

## 2015-11-03 MED ORDER — LIDOCAINE HCL (PF) 1 % IJ SOLN
10.0000 mL | Freq: Once | INTRAMUSCULAR | Status: AC
  Administered 2015-11-03: 10 mL
  Filled 2015-11-03: qty 10

## 2015-11-03 MED ORDER — CLINDAMYCIN PHOSPHATE 600 MG/50ML IV SOLN
600.0000 mg | Freq: Once | INTRAVENOUS | Status: AC
  Administered 2015-11-03: 600 mg via INTRAVENOUS
  Filled 2015-11-03: qty 50

## 2015-11-03 MED ORDER — POVIDONE-IODINE 10 % EX SOLN
CUTANEOUS | Status: AC
  Administered 2015-11-03: 1
  Filled 2015-11-03: qty 118

## 2015-11-03 MED ORDER — OXYCODONE-ACETAMINOPHEN 5-325 MG PO TABS: 1.0000 | ORAL_TABLET | ORAL | 0 refills | Status: DC | PRN

## 2015-11-03 MED ORDER — OXYCODONE-ACETAMINOPHEN 5-325 MG PO TABS: 1.0000 | ORAL_TABLET | Freq: Once | ORAL | Status: DC

## 2015-11-03 MED ORDER — CLINDAMYCIN HCL 150 MG PO CAPS: 300.0000 mg | ORAL_CAPSULE | Freq: Four times a day (QID) | ORAL | 0 refills | Status: DC
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
## 2015-11-03 NOTE — ED Notes (Signed)
Entered room to wrap pt's knee and pt refused wrap and requested tape instead.  Nonadherant and tape placed after cleaning with surecleanse.

## 2015-11-03 NOTE — ED Notes (Signed)
Pt argued with discharge instructions and contradicted everything he was told.  Instructed to follow up in 2 days, pt states he doesn't need to for a week.  Instructed to remove dressing and clean with soap and water.  Pt states he doesn't need to do that until Monday.  Advised to change dressing daily or twice daily, pt states he doesn't have a dressing.  Advised pt he does have a dressing and that is what the nonadherent and tape I placed earlier were called.

## 2015-11-03 NOTE — ED Triage Notes (Signed)
Abscess right lower leg times 3 days.  Right lower leg red and swollen.

## 2015-11-03 NOTE — ED Notes (Signed)
3x3cm abscess to right shin just under knee.  Minimal amt of serosanguinous drainage.  Redness noted to surrounding area.  Nonpitting edema to shin down to ankle.

## 2015-11-03 NOTE — ED Notes (Signed)
Pt upset stating he did not feel he needed and I&D, even though he requested it upon my initial assessment.  Pt also upset he is having to wait for xray.  Explained to pt they would be over shortly.  Pt given Sprite.  Expressed that he should have just waited to see his doctor next week.  Pt also states he cannot get a ride for his pain medication and "just don't worry about it if it's that big a deal."

## 2016-03-29 ENCOUNTER — Other Ambulatory Visit (HOSPITAL_COMMUNITY): Payer: Self-pay | Admitting: Family Medicine

## 2016-03-29 DIAGNOSIS — M545 Low back pain, unspecified: Secondary | ICD-10-CM

## 2016-03-29 DIAGNOSIS — G8929 Other chronic pain: Secondary | ICD-10-CM

## 2016-03-29 DIAGNOSIS — M79645 Pain in left finger(s): Secondary | ICD-10-CM

## 2016-04-03 ENCOUNTER — Ambulatory Visit (HOSPITAL_COMMUNITY)
Admission: RE | Admit: 2016-04-03 | Discharge: 2016-04-03 | Disposition: A | Discharge status: Home / Self Care | Payer: BLUE CROSS/BLUE SHIELD | Source: Ambulatory Visit | Attending: Family Medicine | Admitting: Family Medicine

## 2016-04-03 DIAGNOSIS — G8929 Other chronic pain: Secondary | ICD-10-CM | POA: Insufficient documentation

## 2016-04-03 DIAGNOSIS — M48061 Spinal stenosis, lumbar region without neurogenic claudication: Secondary | ICD-10-CM | POA: Diagnosis not present

## 2016-04-03 DIAGNOSIS — M79645 Pain in left finger(s): Secondary | ICD-10-CM

## 2016-04-03 DIAGNOSIS — M545 Low back pain, unspecified: Secondary | ICD-10-CM

## 2016-04-03 DIAGNOSIS — M47896 Other spondylosis, lumbar region: Secondary | ICD-10-CM | POA: Diagnosis not present

## 2016-04-03 DIAGNOSIS — M5126 Other intervertebral disc displacement, lumbar region: Secondary | ICD-10-CM | POA: Insufficient documentation

## 2016-04-29 IMAGING — MR MR CERVICAL SPINE W/O CM
4 of 5 series · 13 of 48 positions shown · non-contrast
Comparison: Cervical spine MRI 11/20/2011.

CLINICAL DATA: Bilateral lower extremity weakness.

EXAM:
MRI CERVICAL SPINE WITHOUT CONTRAST
TECHNIQUE: Multiplanar, multisequence MR imaging of the cervical spine was
performed. No intravenous contrast was administered.

[Series 4: FLAIR · sagittal · 3.0mm · 0.48mm/px · 3 of 13 slices shown]
[im 1/13]
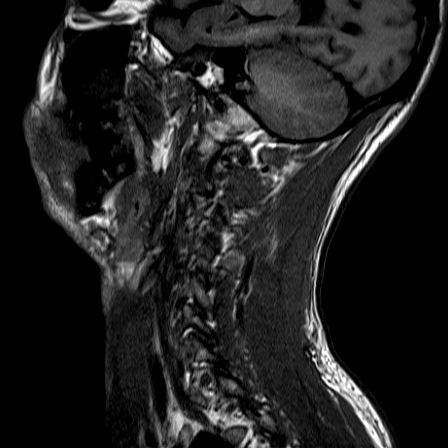
[im 7/13]
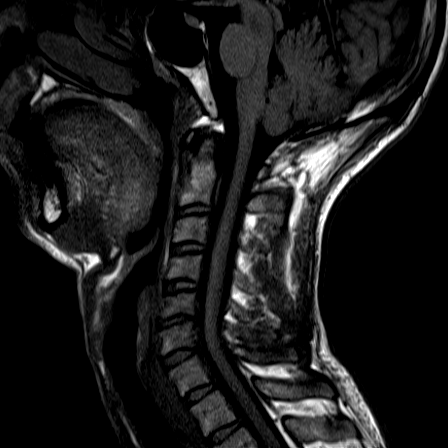
[im 13/13]
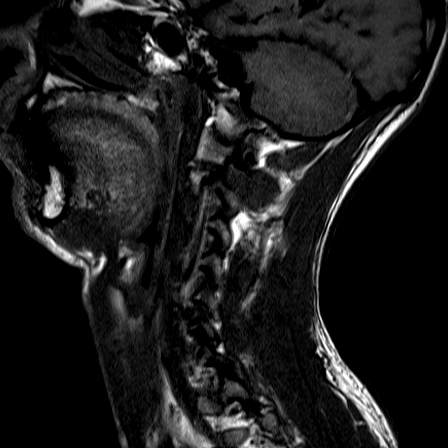

[Series 5: T2 · sagittal · 3.0mm · 0.34mm/px · 4 of 15 slices shown (1 of 2)]
[im 1/15]
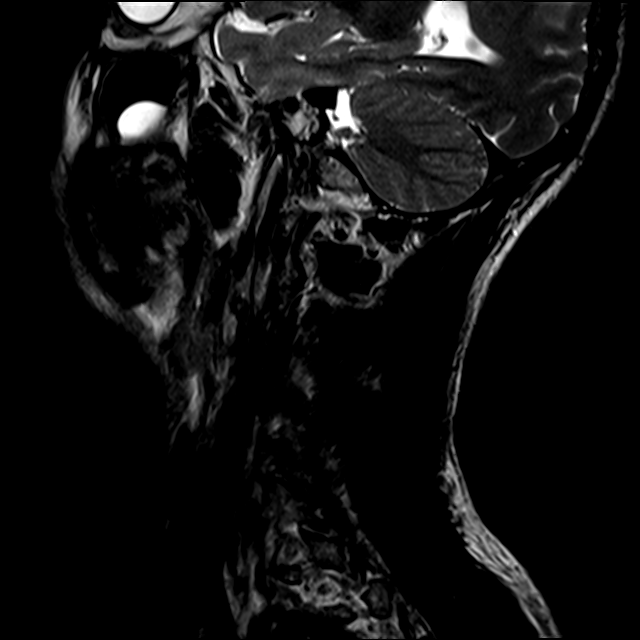
[im 3/15]
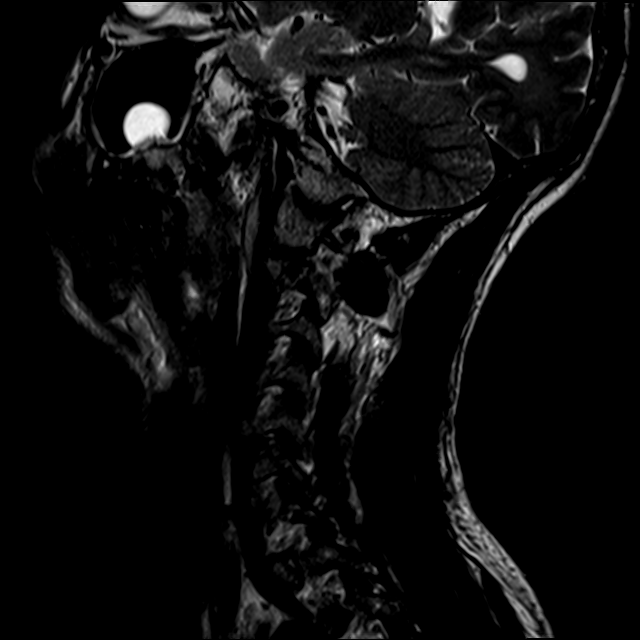
[im 9/15]
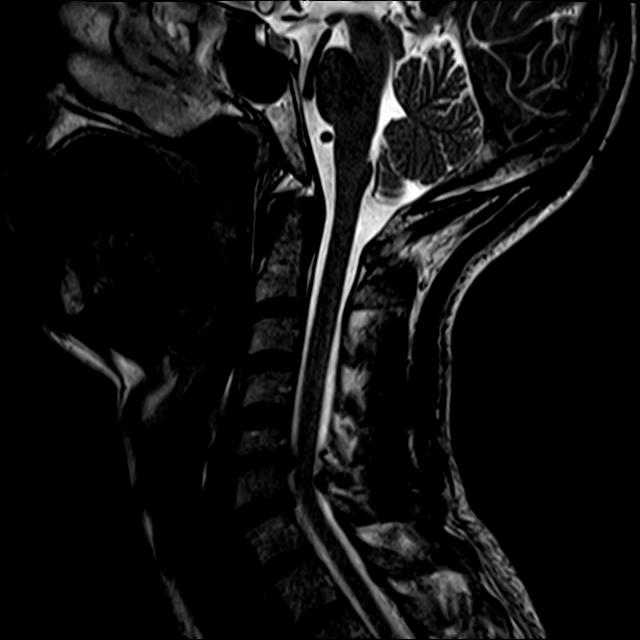
[im 15/15]
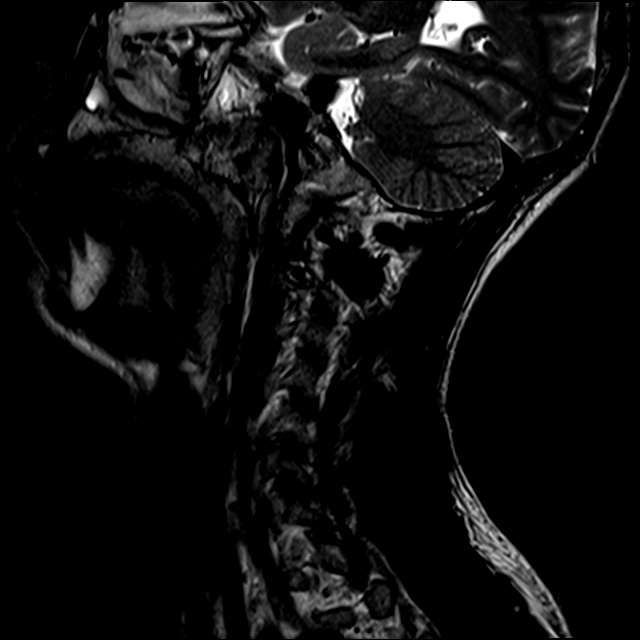

[Series 6: ir sagital · sagittal · 3.0mm · 0.25mm/px · 3 of 13 slices shown]
[im 3/13]
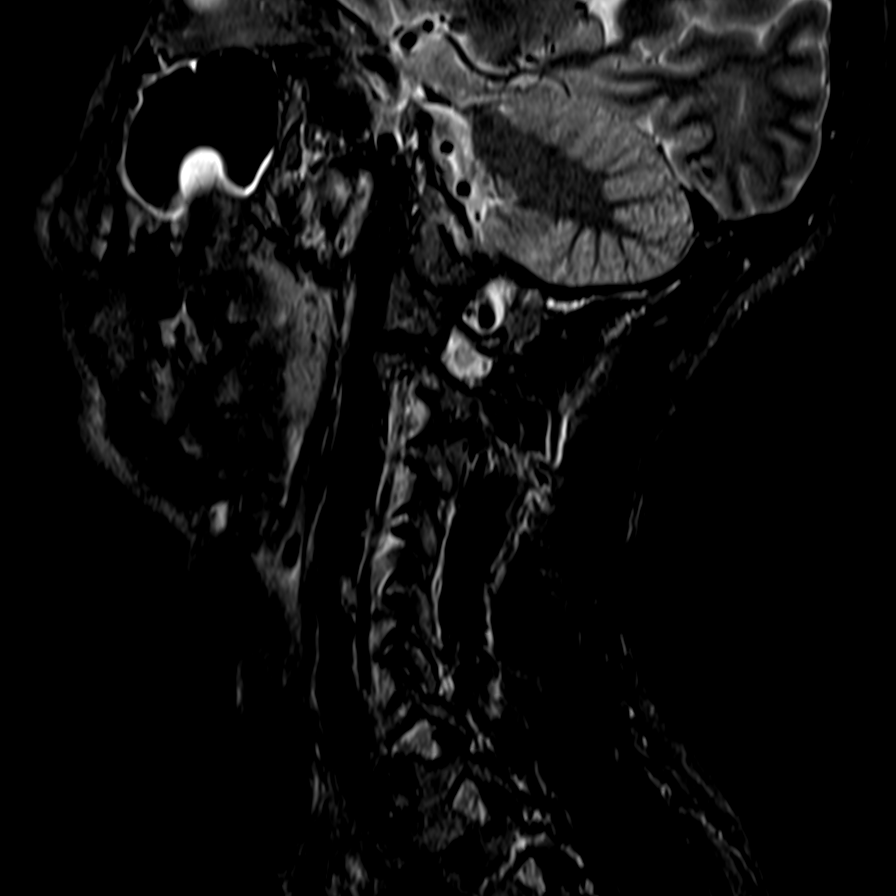
[im 8/13]
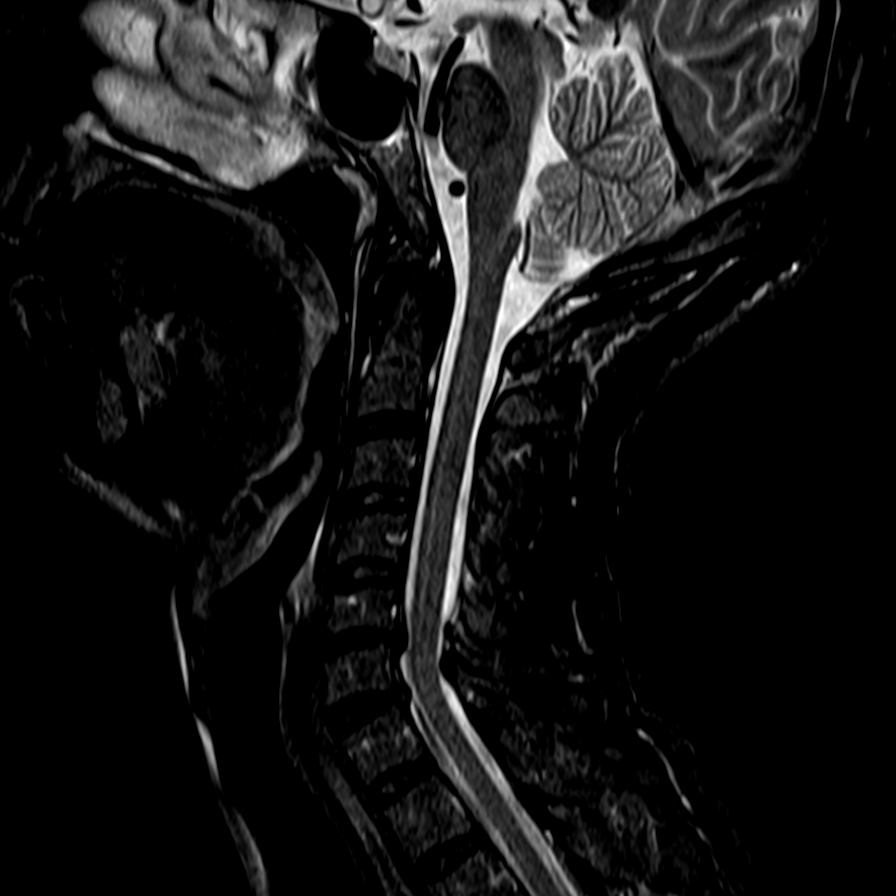
[im 13/13]
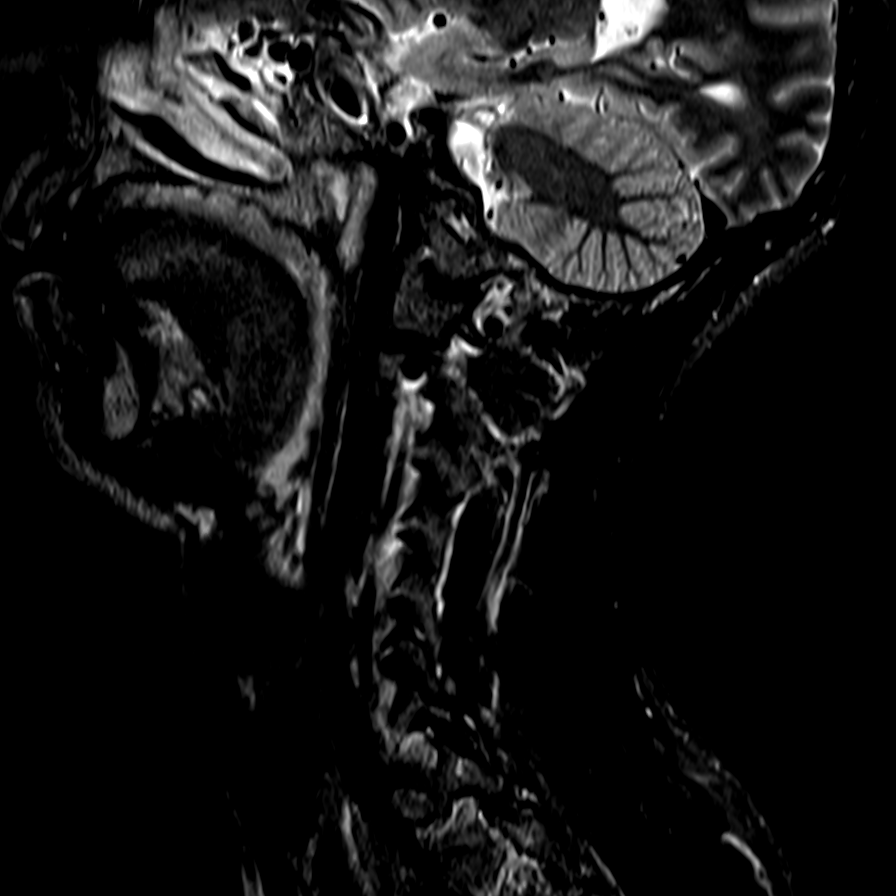

[Series 8: T2 · axial · 3.0mm · 0.18mm/px · z∈[-66,+18]mm · 3 of 34 slices shown (2 of 2)]
[im 5/34]
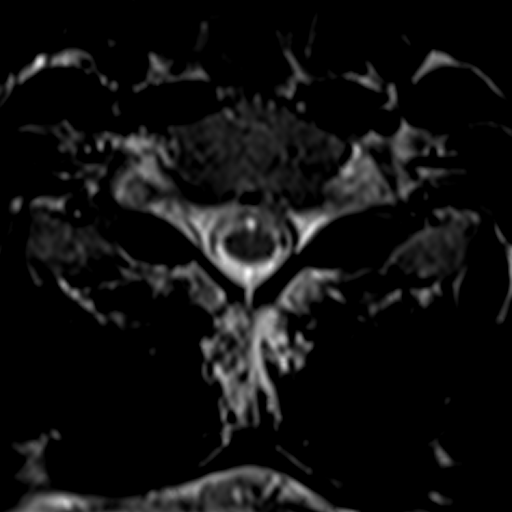
[im 17/34]
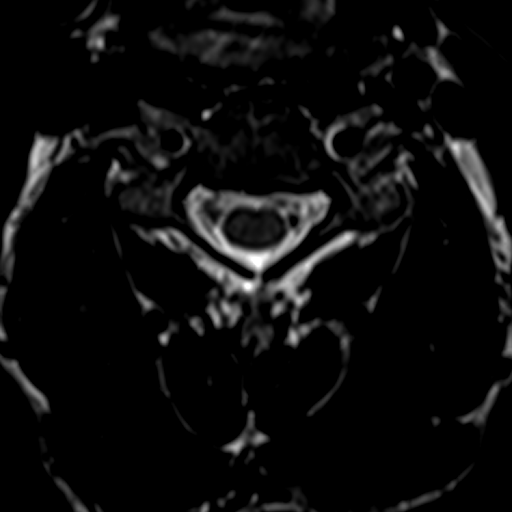
[im 29/34]
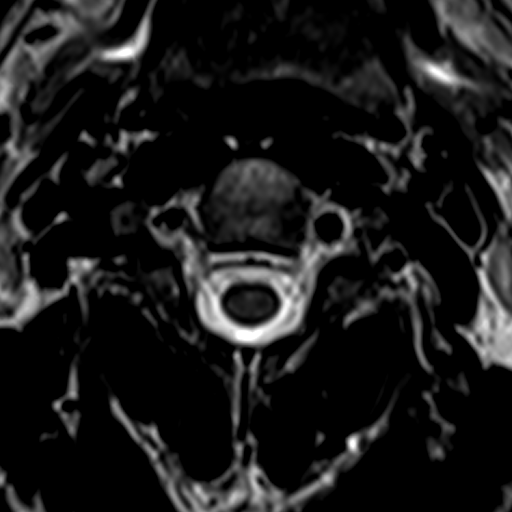

[13 of 48 positions shown; findings below may reference images not displayed]

FINDINGS: Vertebral body height, signal and alignment are maintained. The
craniocervical junction is normal. Cervical cord signal is normal.
Imaged paraspinous structures are unremarkable.

C2-3:  Negative.

C3-4:  Negative.

C4-5:  Negative.

C5-6: Small disc osteophyte complex and uncovertebral disease are
seen. The central canal is minimally narrowed. Mild to moderate
foraminal narrowing appears worse on the left.

C6-7: Shallow disc bulge narrows but does not efface the ventral
thecal sac. Uncovertebral disease causes mild foraminal narrowing

C7-T1:  Negative.
IMPRESSION: No acute abnormality or finding to explain the patient's symptoms.
No change in degenerative disease.

Mild to moderate foraminal narrowing due to uncovertebral disease at
C5-6, worse on the left. Very mild central canal narrowing is also
present at this level.

Mild bilateral foraminal narrowing C6-7 due to uncovertebral
disease.

## 2016-04-29 IMAGING — RF DG FLUORO GUIDE NDL PLC/BX
2 series · 3 of 3 positions shown · non-contrast
Comparison: none

CLINICAL DATA: Back pain.  Numbness.

[Series 1: run · 1 of 1 slices shown (1 of 2)]
[im 1/1]
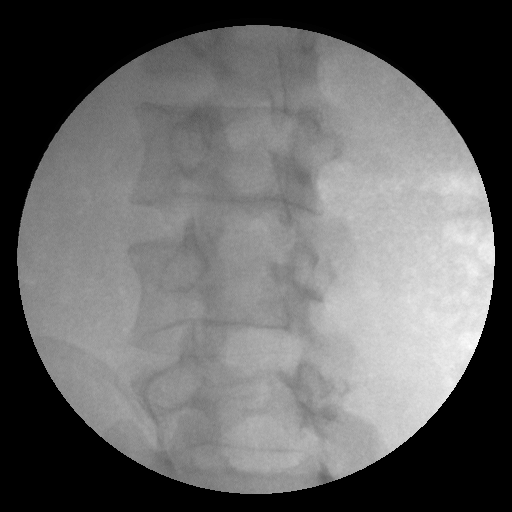

[Series 2: run · 2 of 2 slices shown (2 of 2)]
[im 1/2]
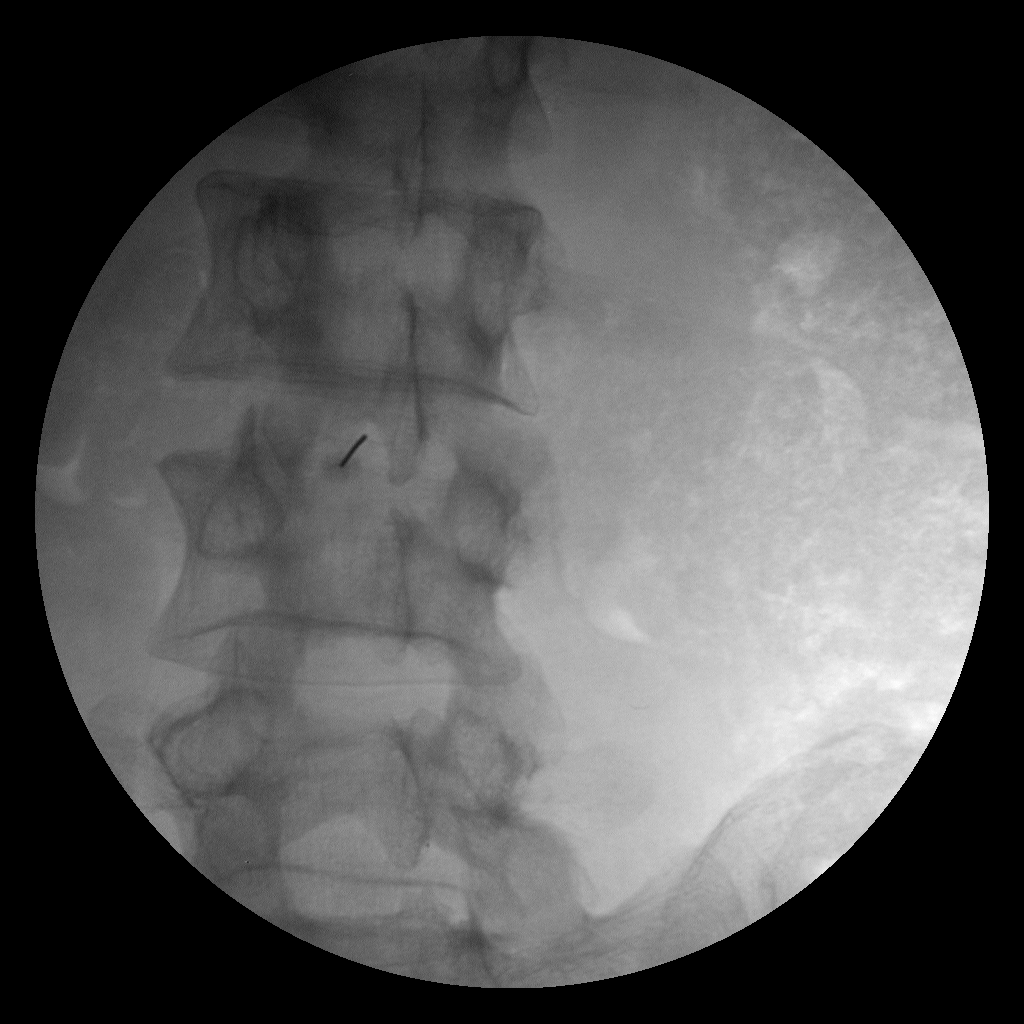
[im 2/2]
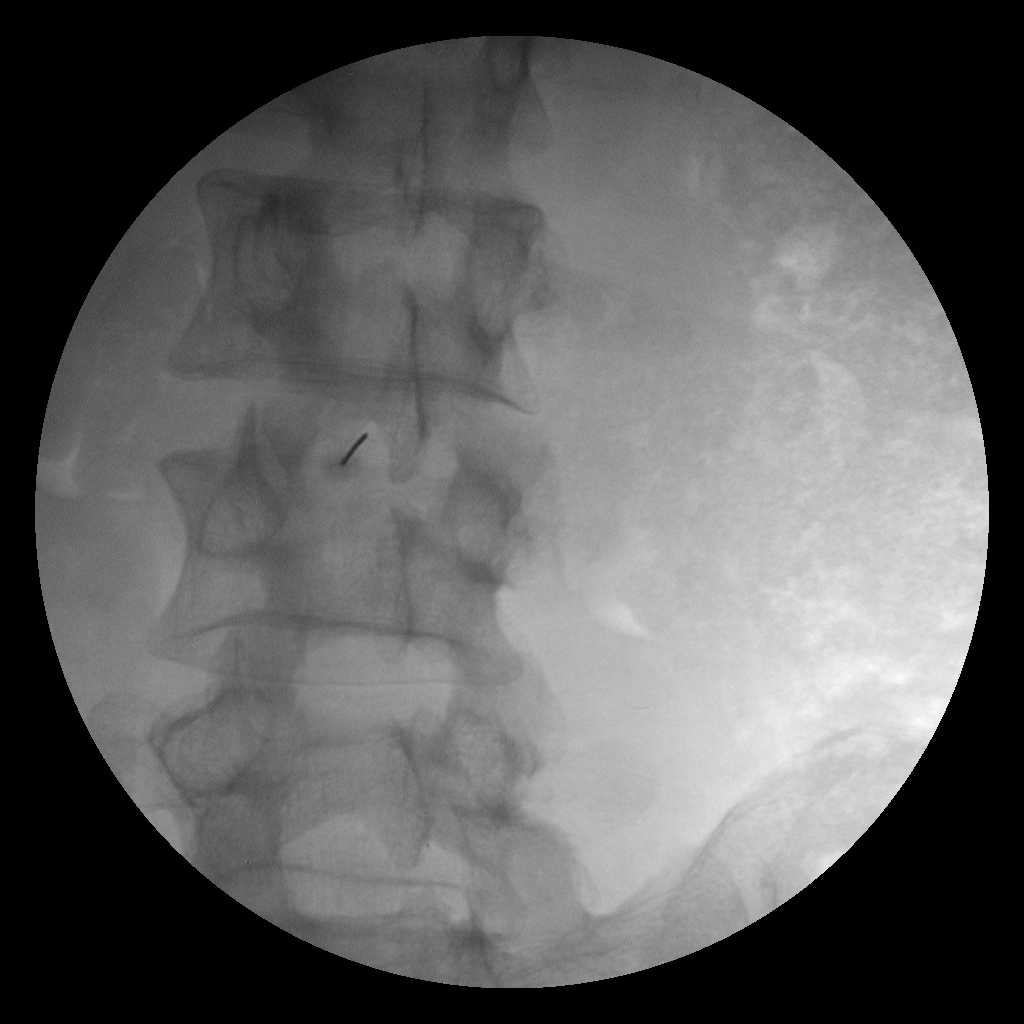

[3 of 3 positions shown; findings below may reference images not displayed]

EXAM:
DIAGNOSTIC LUMBAR PUNCTURE UNDER FLUOROSCOPIC GUIDANCE

FLUOROSCOPY TIME:  Fluoroscopy Time (in minutes and seconds): 0
minutes 48 seconds

Number of Acquired Images:  1

PROCEDURE:
Informed consent was obtained from the patient prior to the
procedure, including potential complications of headache, allergy,
and pain. With the patient prone, the lower back was prepped with
Betadine. 1% Lidocaine was used for local anesthesia. Lumbar
puncture was performed at the L3-L4 level using a 22 gauge needle
with return of clear CSF with an opening pressure of 15 cm water. 13
ml of CSF were obtained for laboratory studies. The patient
tolerated the procedure well and there were no apparent
complications. Patient observed following the procedure both
immediately and approximately 1 hour after the procedure. No
complications.
IMPRESSION: Successful fluoro directed lumbar puncture.

## 2016-11-12 DIAGNOSIS — M47816 Spondylosis without myelopathy or radiculopathy, lumbar region: Secondary | ICD-10-CM | POA: Insufficient documentation

## 2017-09-30 ENCOUNTER — Encounter (HOSPITAL_COMMUNITY): Payer: Self-pay

## 2017-09-30 ENCOUNTER — Emergency Department (HOSPITAL_COMMUNITY)
Admission: EM | Admit: 2017-09-30 | Discharge: 2017-09-30 | Disposition: A | Discharge status: Home / Self Care | Payer: BLUE CROSS/BLUE SHIELD | Attending: Emergency Medicine | Admitting: Emergency Medicine

## 2017-09-30 DIAGNOSIS — Z5321 Procedure and treatment not carried out due to patient leaving prior to being seen by health care provider: Secondary | ICD-10-CM | POA: Diagnosis not present

## 2017-09-30 DIAGNOSIS — R200 Anesthesia of skin: Secondary | ICD-10-CM | POA: Insufficient documentation

## 2017-09-30 NOTE — ED Triage Notes (Addendum)
Pt reports  Right toe numbness x 2 days. Feels like pins and needles sticking in foot. Pt reports hx of degenerative disc today. Pt reports falling 4 weeks ago off a ladder landing oh his back
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
## 2017-10-11 ENCOUNTER — Other Ambulatory Visit (HOSPITAL_COMMUNITY): Payer: Self-pay | Admitting: Family Medicine

## 2017-10-11 ENCOUNTER — Ambulatory Visit (HOSPITAL_COMMUNITY)
Admission: RE | Admit: 2017-10-11 | Discharge: 2017-10-11 | Disposition: A | Discharge status: Home / Self Care | Payer: BLUE CROSS/BLUE SHIELD | Source: Ambulatory Visit | Attending: Family Medicine | Admitting: Family Medicine

## 2017-10-11 DIAGNOSIS — M545 Low back pain: Secondary | ICD-10-CM | POA: Insufficient documentation

## 2017-10-11 DIAGNOSIS — R52 Pain, unspecified: Secondary | ICD-10-CM

## 2017-10-11 DIAGNOSIS — R200 Anesthesia of skin: Secondary | ICD-10-CM | POA: Insufficient documentation

## 2017-10-11 DIAGNOSIS — W19XXXD Unspecified fall, subsequent encounter: Secondary | ICD-10-CM | POA: Insufficient documentation

## 2017-10-27 ENCOUNTER — Other Ambulatory Visit: Payer: Self-pay

## 2017-10-27 ENCOUNTER — Emergency Department (HOSPITAL_COMMUNITY)
Admission: EM | Admit: 2017-10-27 | Discharge: 2017-10-27 | Disposition: A | Discharge status: Home / Self Care | Payer: BLUE CROSS/BLUE SHIELD | Attending: Emergency Medicine | Admitting: Emergency Medicine

## 2017-10-27 ENCOUNTER — Encounter (HOSPITAL_COMMUNITY): Payer: Self-pay | Admitting: Emergency Medicine

## 2017-10-27 DIAGNOSIS — W57XXXA Bitten or stung by nonvenomous insect and other nonvenomous arthropods, initial encounter: Secondary | ICD-10-CM | POA: Insufficient documentation

## 2017-10-27 DIAGNOSIS — Y939 Activity, unspecified: Secondary | ICD-10-CM | POA: Insufficient documentation

## 2017-10-27 DIAGNOSIS — Y929 Unspecified place or not applicable: Secondary | ICD-10-CM | POA: Insufficient documentation

## 2017-10-27 DIAGNOSIS — S60466A Insect bite (nonvenomous) of right little finger, initial encounter: Secondary | ICD-10-CM | POA: Diagnosis present

## 2017-10-27 DIAGNOSIS — Z5321 Procedure and treatment not carried out due to patient leaving prior to being seen by health care provider: Secondary | ICD-10-CM | POA: Insufficient documentation

## 2017-10-27 DIAGNOSIS — Y998 Other external cause status: Secondary | ICD-10-CM | POA: Insufficient documentation

## 2017-10-27 NOTE — ED Notes (Signed)
Called x1 to bring to ro om

## 2017-10-27 NOTE — ED Triage Notes (Signed)
Pt reports R pinky finger has a swollen area with no drainage. States it could be an insect bite. Denies injury. No OTC medication, fever, chills, N/V/D.

## 2017-10-27 NOTE — ED Notes (Signed)
Pt not in waiting room at this time.

## 2017-10-27 NOTE — ED Notes (Signed)
Pt not in waiting area at this time.  

## 2017-10-28 NOTE — ED Notes (Signed)
Follow up call made  No answer  10/28/17  0926  s Simrin Vegh rn

## 2017-11-02 ENCOUNTER — Encounter (HOSPITAL_COMMUNITY): Payer: Self-pay | Admitting: *Deleted

## 2017-11-02 ENCOUNTER — Other Ambulatory Visit: Payer: Self-pay

## 2017-11-02 ENCOUNTER — Emergency Department (HOSPITAL_COMMUNITY)
Admission: EM | Admit: 2017-11-02 | Discharge: 2017-11-02 | Disposition: A | Discharge status: Home / Self Care | Payer: BLUE CROSS/BLUE SHIELD | Attending: Emergency Medicine | Admitting: Emergency Medicine

## 2017-11-02 DIAGNOSIS — Z79899 Other long term (current) drug therapy: Secondary | ICD-10-CM | POA: Diagnosis not present

## 2017-11-02 DIAGNOSIS — I10 Essential (primary) hypertension: Secondary | ICD-10-CM | POA: Diagnosis not present

## 2017-11-02 DIAGNOSIS — M79641 Pain in right hand: Secondary | ICD-10-CM | POA: Diagnosis present

## 2017-11-02 DIAGNOSIS — L02511 Cutaneous abscess of right hand: Secondary | ICD-10-CM | POA: Diagnosis not present

## 2017-11-02 DIAGNOSIS — L089 Local infection of the skin and subcutaneous tissue, unspecified: Secondary | ICD-10-CM | POA: Insufficient documentation

## 2017-11-02 DIAGNOSIS — F1210 Cannabis abuse, uncomplicated: Secondary | ICD-10-CM | POA: Insufficient documentation

## 2017-11-02 DIAGNOSIS — Z87891 Personal history of nicotine dependence: Secondary | ICD-10-CM | POA: Insufficient documentation

## 2017-11-02 MED ORDER — HYDROGEN PEROXIDE 3 % EX SOLN
CUTANEOUS | Status: AC
  Administered 2017-11-02: 06:00:00
  Filled 2017-11-02: qty 473

## 2017-11-02 MED ORDER — DOXYCYCLINE HYCLATE 100 MG PO TABS
100.0000 mg | ORAL_TABLET | Freq: Once | ORAL | Status: AC
  Administered 2017-11-02: 100 mg via ORAL
  Filled 2017-11-02: qty 1

## 2017-11-02 MED ORDER — DOXYCYCLINE HYCLATE 100 MG PO CAPS: 100.0000 mg | ORAL_CAPSULE | Freq: Two times a day (BID) | ORAL | 0 refills | Status: DC

## 2017-11-02 MED ORDER — POVIDONE-IODINE 10 % EX SOLN
CUTANEOUS | Status: AC
  Administered 2017-11-02: 06:00:00
  Filled 2017-11-02: qty 15
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
## 2017-11-02 NOTE — Discharge Instructions (Signed)
Soak your finger in warm epsom salts for 30 minutes 3 times a day. Take the antibiotic until gone. You can take ibuprofen and acetaminophen for pain.  Recheck if not improving over the next 48 hours or if it gets worse.

## 2017-11-02 NOTE — ED Triage Notes (Signed)
Pt reports right pinky finger red, swollen with drainage, states that he is not sure of any injury, denies any fever, chills, n/v, does admit to headache and weakness,

## 2017-12-01 ENCOUNTER — Inpatient Hospital Stay (HOSPITAL_COMMUNITY)
Admission: EM | Admit: 2017-12-01 | Discharge: 2017-12-04 | DRG: 481 | Disposition: A | Discharge status: Home / Self Care | Payer: BLUE CROSS/BLUE SHIELD | Attending: Internal Medicine | Admitting: Internal Medicine

## 2017-12-01 ENCOUNTER — Emergency Department (HOSPITAL_COMMUNITY): Payer: BLUE CROSS/BLUE SHIELD

## 2017-12-01 ENCOUNTER — Encounter (HOSPITAL_COMMUNITY): Payer: Self-pay | Admitting: Emergency Medicine

## 2017-12-01 DIAGNOSIS — S72142A Displaced intertrochanteric fracture of left femur, initial encounter for closed fracture: Secondary | ICD-10-CM | POA: Diagnosis not present

## 2017-12-01 DIAGNOSIS — Z720 Tobacco use: Secondary | ICD-10-CM

## 2017-12-01 DIAGNOSIS — Z419 Encounter for procedure for purposes other than remedying health state, unspecified: Secondary | ICD-10-CM

## 2017-12-01 DIAGNOSIS — G894 Chronic pain syndrome: Secondary | ICD-10-CM | POA: Diagnosis present

## 2017-12-01 DIAGNOSIS — Z681 Body mass index (BMI) 19 or less, adult: Secondary | ICD-10-CM

## 2017-12-01 DIAGNOSIS — E440 Moderate protein-calorie malnutrition: Secondary | ICD-10-CM

## 2017-12-01 DIAGNOSIS — S72002A Fracture of unspecified part of neck of left femur, initial encounter for closed fracture: Secondary | ICD-10-CM

## 2017-12-01 DIAGNOSIS — S0083XA Contusion of other part of head, initial encounter: Secondary | ICD-10-CM | POA: Diagnosis present

## 2017-12-01 DIAGNOSIS — W1830XA Fall on same level, unspecified, initial encounter: Secondary | ICD-10-CM | POA: Diagnosis present

## 2017-12-01 DIAGNOSIS — D62 Acute posthemorrhagic anemia: Secondary | ICD-10-CM | POA: Diagnosis not present

## 2017-12-01 DIAGNOSIS — R339 Retention of urine, unspecified: Secondary | ICD-10-CM | POA: Diagnosis not present

## 2017-12-01 DIAGNOSIS — I10 Essential (primary) hypertension: Secondary | ICD-10-CM | POA: Diagnosis present

## 2017-12-01 DIAGNOSIS — S5011XA Contusion of right forearm, initial encounter: Secondary | ICD-10-CM

## 2017-12-01 DIAGNOSIS — Z01811 Encounter for preprocedural respiratory examination: Secondary | ICD-10-CM

## 2017-12-01 DIAGNOSIS — G629 Polyneuropathy, unspecified: Secondary | ICD-10-CM | POA: Diagnosis present

## 2017-12-01 LAB — CBC WITH DIFFERENTIAL/PLATELET
ABS IMMATURE GRANULOCYTES: 0.1 10*3/uL (ref 0.0–0.1)
Basophils Absolute: 0 10*3/uL (ref 0.0–0.1)
Basophils Relative: 0 %
Eosinophils Absolute: 0 10*3/uL (ref 0.0–0.7)
Eosinophils Relative: 0 %
HCT: 43.6 % (ref 39.0–52.0)
Hemoglobin: 14.2 g/dL (ref 13.0–17.0)
IMMATURE GRANULOCYTES: 1 %
LYMPHS ABS: 1.7 10*3/uL (ref 0.7–4.0)
LYMPHS PCT: 12 %
MCH: 29.3 pg (ref 26.0–34.0)
MCHC: 32.6 g/dL (ref 30.0–36.0)
MCV: 89.9 fL (ref 78.0–100.0)
Monocytes Absolute: 1 10*3/uL (ref 0.1–1.0)
Monocytes Relative: 8 %
NEUTROS ABS: 11 10*3/uL — AB (ref 1.7–7.7)
NEUTROS PCT: 79 %
PLATELETS: 280 10*3/uL (ref 150–400)
RBC: 4.85 MIL/uL (ref 4.22–5.81)
RDW: 12.9 % (ref 11.5–15.5)
WBC: 13.8 10*3/uL — AB (ref 4.0–10.5)

## 2017-12-01 LAB — COMPREHENSIVE METABOLIC PANEL
ALT: 30 U/L (ref 0–44)
AST: 40 U/L (ref 15–41)
Albumin: 4.3 g/dL (ref 3.5–5.0)
Alkaline Phosphatase: 60 U/L (ref 38–126)
Anion gap: 10 (ref 5–15)
BUN: 21 mg/dL — ABNORMAL HIGH (ref 6–20)
CHLORIDE: 111 mmol/L (ref 98–111)
CO2: 23 mmol/L (ref 22–32)
CREATININE: 0.93 mg/dL (ref 0.61–1.24)
Calcium: 9 mg/dL (ref 8.9–10.3)
GFR calc Af Amer: >60 mL/min (ref >60)
GFR calc non Af Amer: >60 mL/min (ref >60)
Glucose, Bld: 77 mg/dL (ref 70–99)
POTASSIUM: 3.2 mmol/L — AB (ref 3.5–5.1)
Sodium: 144 mmol/L (ref 135–145)
Total Bilirubin: 1 mg/dL (ref 0.3–1.2)
Total Protein: 7.4 g/dL (ref 6.5–8.1)

## 2017-12-01 LAB — PROTIME-INR
INR: 1.14
Prothrombin Time: 14.5 seconds (ref 11.4–15.2)

## 2017-12-01 MED ORDER — HYDROMORPHONE HCL 1 MG/ML IJ SOLN
1.0000 mg | Freq: Once | INTRAMUSCULAR | Status: AC
  Administered 2017-12-01: 1 mg via INTRAVENOUS
  Filled 2017-12-01: qty 1

## 2017-12-01 MED ORDER — TETANUS-DIPHTH-ACELL PERTUSSIS 5-2.5-18.5 LF-MCG/0.5 IM SUSP
0.5000 mL | Freq: Once | INTRAMUSCULAR | Status: AC
  Administered 2017-12-01: 0.5 mL via INTRAMUSCULAR
  Filled 2017-12-01: qty 0.5
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
## 2017-12-02 ENCOUNTER — Observation Stay (HOSPITAL_COMMUNITY): Payer: BLUE CROSS/BLUE SHIELD | Admitting: Certified Registered Nurse Anesthetist

## 2017-12-02 ENCOUNTER — Encounter (HOSPITAL_COMMUNITY)
Admission: EM | Disposition: A | Discharge status: Home / Self Care | Payer: Self-pay | Source: Home / Self Care | Attending: Internal Medicine

## 2017-12-02 ENCOUNTER — Other Ambulatory Visit: Payer: Self-pay

## 2017-12-02 ENCOUNTER — Inpatient Hospital Stay (HOSPITAL_COMMUNITY): Payer: BLUE CROSS/BLUE SHIELD

## 2017-12-02 ENCOUNTER — Encounter (HOSPITAL_COMMUNITY): Payer: Self-pay

## 2017-12-02 DIAGNOSIS — Z8781 Personal history of (healed) traumatic fracture: Secondary | ICD-10-CM

## 2017-12-02 DIAGNOSIS — S72142A Displaced intertrochanteric fracture of left femur, initial encounter for closed fracture: Secondary | ICD-10-CM | POA: Diagnosis present

## 2017-12-02 DIAGNOSIS — D62 Acute posthemorrhagic anemia: Secondary | ICD-10-CM | POA: Diagnosis not present

## 2017-12-02 DIAGNOSIS — Z681 Body mass index (BMI) 19 or less, adult: Secondary | ICD-10-CM | POA: Diagnosis not present

## 2017-12-02 DIAGNOSIS — S0083XA Contusion of other part of head, initial encounter: Secondary | ICD-10-CM | POA: Diagnosis present

## 2017-12-02 DIAGNOSIS — E440 Moderate protein-calorie malnutrition: Secondary | ICD-10-CM | POA: Diagnosis present

## 2017-12-02 DIAGNOSIS — R339 Retention of urine, unspecified: Secondary | ICD-10-CM | POA: Diagnosis not present

## 2017-12-02 DIAGNOSIS — G629 Polyneuropathy, unspecified: Secondary | ICD-10-CM | POA: Diagnosis present

## 2017-12-02 DIAGNOSIS — W1830XA Fall on same level, unspecified, initial encounter: Secondary | ICD-10-CM | POA: Diagnosis present

## 2017-12-02 DIAGNOSIS — G894 Chronic pain syndrome: Secondary | ICD-10-CM | POA: Diagnosis present

## 2017-12-02 DIAGNOSIS — Z720 Tobacco use: Secondary | ICD-10-CM | POA: Diagnosis not present

## 2017-12-02 DIAGNOSIS — S5011XA Contusion of right forearm, initial encounter: Secondary | ICD-10-CM | POA: Diagnosis present

## 2017-12-02 DIAGNOSIS — I10 Essential (primary) hypertension: Secondary | ICD-10-CM | POA: Diagnosis present

## 2017-12-02 HISTORY — PX: FEMUR IM NAIL: SHX1597

## 2017-12-02 LAB — URINALYSIS, ROUTINE W REFLEX MICROSCOPIC
BACTERIA UA: NONE SEEN
Bilirubin Urine: NEGATIVE
Glucose, UA: NEGATIVE mg/dL
Ketones, ur: NEGATIVE mg/dL
LEUKOCYTES UA: NEGATIVE
NITRITE: NEGATIVE
PROTEIN: 100 mg/dL — AB
SPECIFIC GRAVITY, URINE: 1.027 (ref 1.005–1.030)
pH: 5 (ref 5.0–8.0)

## 2017-12-02 LAB — CBC
HCT: 41.6 % (ref 39.0–52.0)
Hemoglobin: 13.5 g/dL (ref 13.0–17.0)
MCH: 29.2 pg (ref 26.0–34.0)
MCHC: 32.5 g/dL (ref 30.0–36.0)
MCV: 89.8 fL (ref 78.0–100.0)
Platelets: 261 10*3/uL (ref 150–400)
RBC: 4.63 MIL/uL (ref 4.22–5.81)
RDW: 13.1 % (ref 11.5–15.5)
WBC: 11.3 10*3/uL — AB (ref 4.0–10.5)

## 2017-12-02 LAB — HIV ANTIBODY (ROUTINE TESTING W REFLEX): HIV Screen 4th Generation wRfx: NONREACTIVE

## 2017-12-02 LAB — BASIC METABOLIC PANEL
ANION GAP: 8 (ref 5–15)
BUN: 20 mg/dL (ref 6–20)
CO2: 26 mmol/L (ref 22–32)
CREATININE: 0.78 mg/dL (ref 0.61–1.24)
Calcium: 8.6 mg/dL — ABNORMAL LOW (ref 8.9–10.3)
Chloride: 109 mmol/L (ref 98–111)
GFR calc non Af Amer: >60 mL/min (ref >60)
Glucose, Bld: 85 mg/dL (ref 70–99)
POTASSIUM: 2.9 mmol/L — AB (ref 3.5–5.1)
SODIUM: 143 mmol/L (ref 135–145)

## 2017-12-02 LAB — ALBUMIN: ALBUMIN: 3.9 g/dL (ref 3.5–5.0)

## 2017-12-02 LAB — SURGICAL PCR SCREEN
MRSA, PCR: NEGATIVE
STAPHYLOCOCCUS AUREUS: NEGATIVE

## 2017-12-02 SURGERY — INSERTION, INTRAMEDULLARY ROD, FEMUR
Anesthesia: General | Laterality: Left

## 2017-12-02 MED ORDER — ENOXAPARIN SODIUM 40 MG/0.4ML ~~LOC~~ SOLN: 40.0000 mg | SUBCUTANEOUS | Status: DC

## 2017-12-02 MED ORDER — 0.9 % SODIUM CHLORIDE (POUR BTL) OPTIME
TOPICAL | Status: DC | PRN
  Administered 2017-12-02: 1000 mL

## 2017-12-02 MED ORDER — FENTANYL CITRATE (PF) 250 MCG/5ML IJ SOLN
INTRAMUSCULAR | Status: AC
  Filled 2017-12-02: qty 5

## 2017-12-02 MED ORDER — ONDANSETRON HCL 4 MG/2ML IJ SOLN
INTRAMUSCULAR | Status: AC
  Filled 2017-12-02: qty 2

## 2017-12-02 MED ORDER — VANCOMYCIN HCL IN DEXTROSE 1-5 GM/200ML-% IV SOLN: 1000.0000 mg | INTRAVENOUS | Status: DC

## 2017-12-02 MED ORDER — PHENOL 1.4 % MT LIQD: 1.0000 | OROMUCOSAL | Status: DC | PRN

## 2017-12-02 MED ORDER — ENSURE ENLIVE PO LIQD
237.0000 mL | Freq: Three times a day (TID) | ORAL | Status: DC
  Administered 2017-12-02 – 2017-12-04 (×5): 237 mL via ORAL

## 2017-12-02 MED ORDER — POLYETHYLENE GLYCOL 3350 17 G PO PACK
17.0000 g | PACK | Freq: Every day | ORAL | Status: DC
  Administered 2017-12-03 – 2017-12-04 (×2): 17 g via ORAL
  Filled 2017-12-02 (×2): qty 1

## 2017-12-02 MED ORDER — MELATONIN 3 MG PO TABS
3.0000 mg | ORAL_TABLET | Freq: Every day | ORAL | Status: DC
  Filled 2017-12-02: qty 1

## 2017-12-02 MED ORDER — SODIUM CHLORIDE 0.9 % IV SOLN
INTRAVENOUS | Status: DC
  Administered 2017-12-02: 14:00:00 via INTRAVENOUS

## 2017-12-02 MED ORDER — VANCOMYCIN HCL 10 G IV SOLR: 1500.0000 mg | INTRAVENOUS | Status: DC

## 2017-12-02 MED ORDER — CEFAZOLIN SODIUM-DEXTROSE 2-4 GM/100ML-% IV SOLN
2.0000 g | INTRAVENOUS | Status: AC
  Administered 2017-12-02: 2 g via INTRAVENOUS

## 2017-12-02 MED ORDER — OXYCODONE HCL 5 MG PO TABS
5.0000 mg | ORAL_TABLET | ORAL | Status: DC | PRN
  Administered 2017-12-02 – 2017-12-04 (×4): 10 mg via ORAL
  Filled 2017-12-02 (×4): qty 2

## 2017-12-02 MED ORDER — POTASSIUM CHLORIDE CRYS ER 20 MEQ PO TBCR
40.0000 meq | EXTENDED_RELEASE_TABLET | Freq: Once | ORAL | Status: AC
  Administered 2017-12-02: 40 meq via ORAL
  Filled 2017-12-02: qty 2

## 2017-12-02 MED ORDER — METHOCARBAMOL 1000 MG/10ML IJ SOLN
500.0000 mg | Freq: Four times a day (QID) | INTRAVENOUS | Status: DC | PRN
  Filled 2017-12-02: qty 5

## 2017-12-02 MED ORDER — FENTANYL CITRATE (PF) 250 MCG/5ML IJ SOLN
INTRAMUSCULAR | Status: DC | PRN
  Administered 2017-12-02: 50 ug via INTRAVENOUS
  Administered 2017-12-02: 100 ug via INTRAVENOUS

## 2017-12-02 MED ORDER — HYDROMORPHONE HCL 1 MG/ML IJ SOLN
1.0000 mg | INTRAMUSCULAR | Status: DC | PRN
  Administered 2017-12-02 – 2017-12-04 (×4): 1 mg via INTRAVENOUS
  Filled 2017-12-02 (×7): qty 1

## 2017-12-02 MED ORDER — CEFAZOLIN SODIUM-DEXTROSE 2-4 GM/100ML-% IV SOLN
INTRAVENOUS | Status: AC
  Filled 2017-12-02: qty 100

## 2017-12-02 MED ORDER — HYDROMORPHONE HCL 1 MG/ML IJ SOLN
INTRAMUSCULAR | Status: AC
  Filled 2017-12-02: qty 1

## 2017-12-02 MED ORDER — SODIUM CHLORIDE 0.9 % IV SOLN
INTRAVENOUS | Status: DC | PRN
  Administered 2017-12-02: 11:00:00 via INTRAVENOUS

## 2017-12-02 MED ORDER — DEXTROSE 5 % IV SOLN: 3.0000 g | INTRAVENOUS | Status: DC

## 2017-12-02 MED ORDER — HYDROMORPHONE HCL 1 MG/ML IJ SOLN
0.2500 mg | INTRAMUSCULAR | Status: DC | PRN
  Administered 2017-12-02 (×2): 0.5 mg via INTRAVENOUS

## 2017-12-02 MED ORDER — DEXAMETHASONE SODIUM PHOSPHATE 10 MG/ML IJ SOLN
INTRAMUSCULAR | Status: AC
  Filled 2017-12-02: qty 1

## 2017-12-02 MED ORDER — TRANEXAMIC ACID 1000 MG/10ML IV SOLN
1000.0000 mg | INTRAVENOUS | Status: AC
  Administered 2017-12-02: 1000 mg via INTRAVENOUS
  Filled 2017-12-02: qty 10

## 2017-12-02 MED ORDER — ONDANSETRON HCL 4 MG/2ML IJ SOLN: 4.0000 mg | Freq: Four times a day (QID) | INTRAMUSCULAR | Status: DC | PRN

## 2017-12-02 MED ORDER — MIDAZOLAM HCL 2 MG/2ML IJ SOLN
INTRAMUSCULAR | Status: DC | PRN
  Administered 2017-12-02 (×2): 1 mg via INTRAVENOUS

## 2017-12-02 MED ORDER — PHENYLEPHRINE HCL 10 MG/ML IJ SOLN
INTRAMUSCULAR | Status: AC
  Filled 2017-12-02: qty 1

## 2017-12-02 MED ORDER — GABAPENTIN 300 MG PO CAPS
300.0000 mg | ORAL_CAPSULE | Freq: Two times a day (BID) | ORAL | Status: DC
  Administered 2017-12-02 – 2017-12-04 (×5): 300 mg via ORAL
  Filled 2017-12-02 (×5): qty 1

## 2017-12-02 MED ORDER — PROPOFOL 10 MG/ML IV BOLUS
INTRAVENOUS | Status: DC | PRN
  Administered 2017-12-02: 170 mg via INTRAVENOUS

## 2017-12-02 MED ORDER — MIDAZOLAM HCL 2 MG/2ML IJ SOLN
INTRAMUSCULAR | Status: AC
  Filled 2017-12-02: qty 2

## 2017-12-02 MED ORDER — ENOXAPARIN SODIUM 40 MG/0.4ML ~~LOC~~ SOLN: 40.0000 mg | Freq: Every day | SUBCUTANEOUS | 0 refills | Status: DC

## 2017-12-02 MED ORDER — PHENYLEPHRINE 40 MCG/ML (10ML) SYRINGE FOR IV PUSH (FOR BLOOD PRESSURE SUPPORT)
PREFILLED_SYRINGE | INTRAVENOUS | Status: DC | PRN
  Administered 2017-12-02: 80 ug via INTRAVENOUS
  Administered 2017-12-02: 120 ug via INTRAVENOUS
  Administered 2017-12-02 (×3): 160 ug via INTRAVENOUS

## 2017-12-02 MED ORDER — DEXAMETHASONE SODIUM PHOSPHATE 10 MG/ML IJ SOLN
INTRAMUSCULAR | Status: DC | PRN
  Administered 2017-12-02: 10 mg via INTRAVENOUS

## 2017-12-02 MED ORDER — ENOXAPARIN SODIUM 40 MG/0.4ML ~~LOC~~ SOLN
40.0000 mg | SUBCUTANEOUS | Status: DC
  Administered 2017-12-03 – 2017-12-04 (×2): 40 mg via SUBCUTANEOUS
  Filled 2017-12-02 (×2): qty 0.4

## 2017-12-02 MED ORDER — LIDOCAINE HCL (CARDIAC) PF 100 MG/5ML IV SOSY
PREFILLED_SYRINGE | INTRAVENOUS | Status: DC | PRN
  Administered 2017-12-02: 60 mg via INTRAVENOUS

## 2017-12-02 MED ORDER — MELATONIN 3 MG PO TABS
6.0000 mg | ORAL_TABLET | Freq: Every day | ORAL | Status: DC
  Administered 2017-12-02 – 2017-12-03 (×2): 6 mg via ORAL
  Filled 2017-12-02 (×5): qty 2

## 2017-12-02 MED ORDER — ADULT MULTIVITAMIN W/MINERALS CH
1.0000 | ORAL_TABLET | Freq: Every day | ORAL | Status: DC
  Administered 2017-12-02 – 2017-12-04 (×3): 1 via ORAL
  Filled 2017-12-02 (×3): qty 1

## 2017-12-02 MED ORDER — METHOCARBAMOL 500 MG PO TABS
500.0000 mg | ORAL_TABLET | Freq: Four times a day (QID) | ORAL | Status: DC | PRN
  Administered 2017-12-03 – 2017-12-04 (×3): 500 mg via ORAL
  Filled 2017-12-02 (×5): qty 1

## 2017-12-02 MED ORDER — ONDANSETRON HCL 4 MG/2ML IJ SOLN
INTRAMUSCULAR | Status: DC | PRN
  Administered 2017-12-02: 4 mg via INTRAVENOUS

## 2017-12-02 MED ORDER — DOCUSATE SODIUM 100 MG PO CAPS
100.0000 mg | ORAL_CAPSULE | Freq: Two times a day (BID) | ORAL | Status: DC
  Administered 2017-12-02 – 2017-12-04 (×4): 100 mg via ORAL
  Filled 2017-12-02 (×4): qty 1

## 2017-12-02 MED ORDER — POVIDONE-IODINE 10 % EX SWAB: 2.0000 "application " | Freq: Once | CUTANEOUS | Status: DC

## 2017-12-02 MED ORDER — SUGAMMADEX SODIUM 200 MG/2ML IV SOLN
INTRAVENOUS | Status: DC | PRN
  Administered 2017-12-02: 200 mg via INTRAVENOUS

## 2017-12-02 MED ORDER — OXYCODONE HCL 5 MG PO TABS
10.0000 mg | ORAL_TABLET | ORAL | Status: DC | PRN
  Administered 2017-12-02: 10 mg via ORAL
  Administered 2017-12-04: 15 mg via ORAL
  Filled 2017-12-02: qty 2
  Filled 2017-12-02: qty 3

## 2017-12-02 MED ORDER — OXYCODONE HCL 5 MG PO TABS: 5.0000 mg | ORAL_TABLET | ORAL | 0 refills | Status: DC | PRN

## 2017-12-02 MED ORDER — HYDROMORPHONE HCL 1 MG/ML IJ SOLN
0.5000 mg | INTRAMUSCULAR | Status: DC | PRN
  Administered 2017-12-02 – 2017-12-03 (×4): 1 mg via INTRAVENOUS
  Filled 2017-12-02 (×2): qty 1

## 2017-12-02 MED ORDER — POTASSIUM CHLORIDE 10 MEQ/100ML IV SOLN
10.0000 meq | INTRAVENOUS | Status: AC
  Administered 2017-12-02 (×2): 10 meq via INTRAVENOUS
  Filled 2017-12-02 (×4): qty 100

## 2017-12-02 MED ORDER — OXYCODONE HCL 5 MG PO TABS
10.0000 mg | ORAL_TABLET | ORAL | Status: DC | PRN
  Administered 2017-12-02: 10 mg via ORAL
  Filled 2017-12-02: qty 2

## 2017-12-02 MED ORDER — PHENYLEPHRINE 40 MCG/ML (10ML) SYRINGE FOR IV PUSH (FOR BLOOD PRESSURE SUPPORT)
PREFILLED_SYRINGE | INTRAVENOUS | Status: AC
  Filled 2017-12-02: qty 30

## 2017-12-02 MED ORDER — MENTHOL 3 MG MT LOZG: 1.0000 | LOZENGE | OROMUCOSAL | Status: DC | PRN

## 2017-12-02 MED ORDER — ROCURONIUM BROMIDE 50 MG/5ML IV SOSY
PREFILLED_SYRINGE | INTRAVENOUS | Status: AC
  Filled 2017-12-02: qty 5

## 2017-12-02 MED ORDER — HYDROMORPHONE HCL 1 MG/ML IJ SOLN: 1.0000 mg | INTRAMUSCULAR | Status: DC | PRN

## 2017-12-02 MED ORDER — ACETAMINOPHEN 500 MG PO TABS
1000.0000 mg | ORAL_TABLET | Freq: Three times a day (TID) | ORAL | Status: DC
  Administered 2017-12-02 – 2017-12-04 (×6): 1000 mg via ORAL
  Filled 2017-12-02 (×7): qty 2

## 2017-12-02 MED ORDER — MEPERIDINE HCL 50 MG/ML IJ SOLN: 6.2500 mg | INTRAMUSCULAR | Status: DC | PRN

## 2017-12-02 MED ORDER — ALUM & MAG HYDROXIDE-SIMETH 200-200-20 MG/5ML PO SUSP
30.0000 mL | ORAL | Status: DC | PRN
  Administered 2017-12-03 – 2017-12-04 (×2): 30 mL via ORAL
  Filled 2017-12-02 (×3): qty 30

## 2017-12-02 MED ORDER — SODIUM CHLORIDE 0.9 % IV SOLN
INTRAVENOUS | Status: DC | PRN
  Administered 2017-12-02: 25 ug/min via INTRAVENOUS

## 2017-12-02 MED ORDER — MAGNESIUM CITRATE PO SOLN: 1.0000 | Freq: Once | ORAL | Status: DC | PRN

## 2017-12-02 MED ORDER — SORBITOL 70 % SOLN: 30.0000 mL | Freq: Every day | Status: DC | PRN

## 2017-12-02 MED ORDER — MIDAZOLAM HCL 2 MG/2ML IJ SOLN: 0.5000 mg | Freq: Once | INTRAMUSCULAR | Status: DC | PRN

## 2017-12-02 MED ORDER — OXYCODONE HCL ER 10 MG PO T12A
10.0000 mg | EXTENDED_RELEASE_TABLET | Freq: Two times a day (BID) | ORAL | Status: DC
  Administered 2017-12-02 – 2017-12-04 (×5): 10 mg via ORAL
  Filled 2017-12-02 (×5): qty 1

## 2017-12-02 MED ORDER — LIDOCAINE 2% (20 MG/ML) 5 ML SYRINGE
INTRAMUSCULAR | Status: AC
  Filled 2017-12-02: qty 5

## 2017-12-02 MED ORDER — AMLODIPINE BESYLATE 5 MG PO TABS
5.0000 mg | ORAL_TABLET | Freq: Every day | ORAL | Status: DC
  Administered 2017-12-03 – 2017-12-04 (×2): 5 mg via ORAL
  Filled 2017-12-02 (×2): qty 1

## 2017-12-02 MED ORDER — ROCURONIUM BROMIDE 100 MG/10ML IV SOLN
INTRAVENOUS | Status: DC | PRN
  Administered 2017-12-02: 20 mg via INTRAVENOUS
  Administered 2017-12-02: 10 mg via INTRAVENOUS
  Administered 2017-12-02: 50 mg via INTRAVENOUS

## 2017-12-02 MED ORDER — ONDANSETRON HCL 4 MG PO TABS: 4.0000 mg | ORAL_TABLET | Freq: Four times a day (QID) | ORAL | Status: DC | PRN

## 2017-12-02 MED ORDER — CALCIUM CARBONATE-VITAMIN D 500-200 MG-UNIT PO TABS: 1.0000 | ORAL_TABLET | Freq: Three times a day (TID) | ORAL | 12 refills | Status: DC

## 2017-12-02 MED ORDER — ZINC SULFATE 220 (50 ZN) MG PO CAPS: 220.0000 mg | ORAL_CAPSULE | Freq: Every day | ORAL | 0 refills | Status: DC

## 2017-12-02 MED ORDER — POLYETHYLENE GLYCOL 3350 17 G PO PACK
17.0000 g | PACK | Freq: Every day | ORAL | Status: DC | PRN
  Administered 2017-12-03: 17 g via ORAL
  Filled 2017-12-02: qty 1

## 2017-12-02 MED ORDER — PROPOFOL 10 MG/ML IV BOLUS
INTRAVENOUS | Status: AC
  Filled 2017-12-02: qty 20

## 2017-12-02 MED ORDER — CEFAZOLIN SODIUM-DEXTROSE 2-4 GM/100ML-% IV SOLN
2.0000 g | Freq: Four times a day (QID) | INTRAVENOUS | Status: AC
  Administered 2017-12-02 – 2017-12-03 (×3): 2 g via INTRAVENOUS
  Filled 2017-12-02 (×3): qty 100

## 2017-12-02 MED ORDER — SENNOSIDES-DOCUSATE SODIUM 8.6-50 MG PO TABS
1.0000 | ORAL_TABLET | Freq: Two times a day (BID) | ORAL | Status: DC | PRN
  Administered 2017-12-03: 1 via ORAL
  Filled 2017-12-02: qty 1

## 2017-12-02 MED ORDER — PROMETHAZINE HCL 25 MG/ML IJ SOLN: 6.2500 mg | INTRAMUSCULAR | Status: DC | PRN

## 2017-12-02 SURGICAL SUPPLY — 44 items
BIT DRILL SHORT 4.0 (BIT) IMPLANT
BNDG COHESIVE 4X5 TAN STRL (GAUZE/BANDAGES/DRESSINGS) ×2 IMPLANT
COVER PERINEAL POST (MISCELLANEOUS) ×2 IMPLANT
COVER SURGICAL LIGHT HANDLE (MISCELLANEOUS) ×2 IMPLANT
DRAPE C-ARMOR (DRAPES) ×2 IMPLANT
DRAPE HALF SHEET 40X57 (DRAPES) IMPLANT
DRAPE INCISE IOBAN 66X45 STRL (DRAPES) IMPLANT
DRAPE ORTHO SPLIT 77X108 STRL (DRAPES)
DRAPE SURG ORHT 6 SPLT 77X108 (DRAPES) IMPLANT
DRILL BIT SHORT 4.0 (BIT) ×2
DRSG MEPILEX BORDER 4X4 (GAUZE/BANDAGES/DRESSINGS) ×6 IMPLANT
DURAPREP 26ML APPLICATOR (WOUND CARE) ×2 IMPLANT
ELECT REM PT RETURN 9FT ADLT (ELECTROSURGICAL)
ELECTRODE REM PT RTRN 9FT ADLT (ELECTROSURGICAL) IMPLANT
FACESHIELD WRAPAROUND (MASK) ×4 IMPLANT
FACESHIELD WRAPAROUND OR TEAM (MASK) ×2 IMPLANT
GLOVE BIOGEL PI IND STRL 7.0 (GLOVE) ×1 IMPLANT
GLOVE BIOGEL PI INDICATOR 7.0 (GLOVE) ×1
GLOVE ECLIPSE 7.0 STRL STRAW (GLOVE) ×2 IMPLANT
GLOVE SKINSENSE NS SZ7.5 (GLOVE) ×1
GLOVE SKINSENSE STRL SZ7.5 (GLOVE) ×1 IMPLANT
GLOVE SURG SYN 7.5  E (GLOVE) ×1
GLOVE SURG SYN 7.5 E (GLOVE) ×1 IMPLANT
GLOVE SURG SYN 7.5 PF PI (GLOVE) ×1 IMPLANT
GOWN STRL REIN XL XLG (GOWN DISPOSABLE) ×2 IMPLANT
GUIDE PIN 3.2X343 (PIN) ×2
GUIDE PIN 3.2X343MM (PIN) ×2
KIT BASIN OR (CUSTOM PROCEDURE TRAY) ×2 IMPLANT
KIT TURNOVER KIT B (KITS) ×2 IMPLANT
LINER BOOT UNIVERSAL DISP (MISCELLANEOUS) ×2 IMPLANT
MANIFOLD NEPTUNE II (INSTRUMENTS) ×2 IMPLANT
NAIL TRIGEN 10MMX40CM-125 LEFT (Nail) ×1 IMPLANT
NS IRRIG 1000ML POUR BTL (IV SOLUTION) ×2 IMPLANT
PACK GENERAL/GYN (CUSTOM PROCEDURE TRAY) ×2 IMPLANT
PAD ARMBOARD 7.5X6 YLW CONV (MISCELLANEOUS) ×4 IMPLANT
PIN GUIDE 3.2X343MM (PIN) IMPLANT
SCREW LAG COMPR KIT 90/85 (Screw) ×1 IMPLANT
SCREW TRIGEN LOW PROF 5.0X45 (Screw) ×1 IMPLANT
STAPLER VISISTAT 35W (STAPLE) ×2 IMPLANT
SUT VIC AB 0 CT1 27 (SUTURE) ×2
SUT VIC AB 0 CT1 27XBRD ANBCTR (SUTURE) ×1 IMPLANT
SUT VIC AB 2-0 CT1 27 (SUTURE) ×1
SUT VIC AB 2-0 CT1 TAPERPNT 27 (SUTURE) ×1 IMPLANT
WATER STERILE IRR 1000ML POUR (IV SOLUTION) ×4 IMPLANT
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
## 2017-12-02 NOTE — Transfer of Care (Signed)
Immediate Anesthesia Transfer of Care Note  Patient: Benjamin Vaughn.  Procedure(s) Performed: INTRAMEDULLARY (IM) NAIL FEMORAL (Left )  Patient Location: PACU  Anesthesia Type:General  Level of Consciousness: drowsy and patient cooperative  Airway & Oxygen Therapy: Patient Spontanous Breathing and Patient connected to nasal cannula oxygen  Post-op Assessment: Report given to RN and Post -op Vital signs reviewed and stable  Post vital signs: Reviewed and stable  Last Vitals:  Vitals Value Taken Time  BP 134/87 12/02/2017 12:40 PM  Temp 36.4 C 12/02/2017 12:40 PM  Pulse 80 12/02/2017 12:42 PM  Resp 16 12/02/2017 12:42 PM  SpO2 96 % 12/02/2017 12:42 PM  Vitals shown include unvalidated device data.  Last Pain:  Vitals:   12/02/17 0821  PainSc: 8       Patients Stated Pain Goal: 6 (12/01/17 2353)  Complications: No apparent anesthesia complications

## 2017-12-02 NOTE — Anesthesia Preprocedure Evaluation (Addendum)
Anesthesia Evaluation  Patient identified by MRN, date of birth, ID band Patient awake    Reviewed: Allergy & Precautions, NPO status , Patient's Chart, lab work & pertinent test results  History of Anesthesia Complications Negative for: history of anesthetic complications  Airway Mallampati: II  TM Distance: >3 FB Neck ROM: Full    Dental  (+) Poor Dentition, Chipped, Dental Advisory Given, Missing   Pulmonary former smoker (quit 1995),    breath sounds clear to auscultation       Cardiovascular hypertension, Pt. on medications (-) angina Rhythm:Regular Rate:Normal     Neuro/Psych Anxiety Depression Chronic pain: narcotics    GI/Hepatic negative GI ROS, (+)     substance abuse  marijuana use,   Endo/Other  negative endocrine ROS  Renal/GU negative Renal ROS     Musculoskeletal   Abdominal   Peds  Hematology negative hematology ROS (+)   Anesthesia Other Findings   Reproductive/Obstetrics                           Anesthesia Physical Anesthesia Plan  ASA: II  Anesthesia Plan: General   Post-op Pain Management:    Induction: Intravenous  PONV Risk Score and Plan: 3 and Ondansetron and Dexamethasone  Airway Management Planned: Oral ETT  Additional Equipment:   Intra-op Plan:   Post-operative Plan: Extubation in OR  Informed Consent: I have reviewed the patients History and Physical, chart, labs and discussed the procedure including the risks, benefits and alternatives for the proposed anesthesia with the patient or authorized representative who has indicated his/her understanding and acceptance.   Dental advisory given  Plan Discussed with: Surgeon and CRNA  Anesthesia Plan Comments: (Plan routine monitors, GETA)        Anesthesia Quick Evaluation

## 2017-12-02 NOTE — Discharge Instructions (Signed)
° ° °  1. Change dressings as needed °2. May shower but keep incisions covered and dry °3. Take lovenox to prevent blood clots °4. Take stool softeners as needed °5. Take pain meds as needed ° °
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
## 2017-12-02 NOTE — Progress Notes (Signed)
Seen patient chewing tobacco. Advised patient not to do it.
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
## 2017-12-02 NOTE — Anesthesia Procedure Notes (Addendum)
Procedure Name: Intubation Date/Time: 12/02/2017 11:04 AM Performed by: Raenette Rover, CRNA Pre-anesthesia Checklist: Patient identified, Emergency Drugs available, Suction available and Patient being monitored Patient Re-evaluated:Patient Re-evaluated prior to induction Oxygen Delivery Method: Circle system utilized Preoxygenation: Pre-oxygenation with 100% oxygen Induction Type: IV induction Ventilation: Mask ventilation without difficulty Laryngoscope Size: Mac and 4 Grade View: Grade II Tube type: Oral Tube size: 7.5 mm Number of attempts: 1 Airway Equipment and Method: Stylet Placement Confirmation: ETT inserted through vocal cords under direct vision,  positive ETCO2,  CO2 detector and breath sounds checked- equal and bilateral Secured at: 22 cm Tube secured with: Tape Dental Injury: Teeth and Oropharynx as per pre-operative assessment
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
## 2017-12-02 NOTE — Progress Notes (Addendum)
0940 Pt to Short stay via bed, pt is A&O x4. NPO maint.  1340 Received pt back from PACU, A&O x4. Left hip dressings dry and intact. C/o surgical pain, pain meds given.

## 2017-12-03 DIAGNOSIS — D62 Acute posthemorrhagic anemia: Secondary | ICD-10-CM

## 2017-12-03 DIAGNOSIS — E440 Moderate protein-calorie malnutrition: Secondary | ICD-10-CM

## 2017-12-03 LAB — BASIC METABOLIC PANEL
Anion gap: 4 — ABNORMAL LOW (ref 5–15)
BUN: 23 mg/dL — ABNORMAL HIGH (ref 6–20)
CHLORIDE: 104 mmol/L (ref 98–111)
CO2: 28 mmol/L (ref 22–32)
CREATININE: 0.99 mg/dL (ref 0.61–1.24)
Calcium: 8.1 mg/dL — ABNORMAL LOW (ref 8.9–10.3)
GFR calc non Af Amer: >60 mL/min (ref >60)
Glucose, Bld: 158 mg/dL — ABNORMAL HIGH (ref 70–99)
Potassium: 4.5 mmol/L (ref 3.5–5.1)
SODIUM: 136 mmol/L (ref 135–145)

## 2017-12-03 LAB — CBC
HEMATOCRIT: 30.7 % — AB (ref 39.0–52.0)
HEMOGLOBIN: 9.9 g/dL — AB (ref 13.0–17.0)
MCH: 29.6 pg (ref 26.0–34.0)
MCHC: 32.2 g/dL (ref 30.0–36.0)
MCV: 91.9 fL (ref 78.0–100.0)
Platelets: 236 10*3/uL (ref 150–400)
RBC: 3.34 MIL/uL — ABNORMAL LOW (ref 4.22–5.81)
RDW: 12.8 % (ref 11.5–15.5)
WBC: 16.1 10*3/uL — AB (ref 4.0–10.5)

## 2017-12-03 LAB — VITAMIN D 25 HYDROXY (VIT D DEFICIENCY, FRACTURES): Vit D, 25-Hydroxy: 45.3 ng/mL (ref 30.0–100.0)
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
## 2017-12-03 NOTE — Anesthesia Postprocedure Evaluation (Signed)
Anesthesia Post Note  Patient: Benjamin Vaughn.  Procedure(s) Performed: INTRAMEDULLARY (IM) NAIL FEMORAL (Left )     Patient location during evaluation: PACU Anesthesia Type: General Level of consciousness: awake and alert Pain management: pain level controlled Vital Signs Assessment: post-procedure vital signs reviewed and stable Respiratory status: spontaneous breathing, nonlabored ventilation, respiratory function stable and patient connected to nasal cannula oxygen Cardiovascular status: blood pressure returned to baseline and stable Postop Assessment: no apparent nausea or vomiting Anesthetic complications: no    Last Vitals:  Vitals:   12/03/17 0040 12/03/17 0358  BP: 130/82 113/81  Pulse: 88 67  Resp: 16 15  Temp: 37.1 C 36.8 C  SpO2: 100% 100%    Last Pain:  Vitals:   12/03/17 0719  TempSrc:   PainSc: 6                  Catrinia Racicot S
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
## 2017-12-03 NOTE — Progress Notes (Signed)
Subjective: 1 Day Post-Op Procedure(s) (LRB): INTRAMEDULLARY (IM) NAIL FEMORAL (Left) Patient reports pain as mild.  Minimal pain.  C/o urinary retention  Objective: Vital signs in last 24 hours: Temp:  [97.6 F (36.4 C)-98.7 F (37.1 C)] 98.2 F (36.8 C) (10/01 0358) Pulse Rate:  [66-89] 67 (10/01 0358) Resp:  [12-18] 15 (10/01 0358) BP: (101-142)/(56-99) 113/81 (10/01 0358) SpO2:  [97 %-100 %] 100 % (10/01 0358) Weight:  [63.5 kg] 63.5 kg (09/30 1559)  Intake/Output from previous day: 09/30 0701 - 10/01 0700 In: 1090 [P.O.:240; I.V.:800] Out: 450 [Urine:400; Blood:50] Intake/Output this shift: No intake/output data recorded.  Recent Labs    12/01/17 2312 12/02/17 0417  HGB 14.2 13.5   Recent Labs    12/01/17 2312 12/02/17 0417  WBC 13.8* 11.3*  RBC 4.85 4.63  HCT 43.6 41.6  PLT 280 261   Recent Labs    12/01/17 2312 12/02/17 0417  NA 144 143  K 3.2* 2.9*  CL 111 109  CO2 23 26  BUN 21* 20  CREATININE 0.93 0.78  GLUCOSE 77 85  CALCIUM 9.0 8.6*   Recent Labs    12/01/17 2312  INR 1.14    Neurologically intact Neurovascular intact Sensation intact distally Intact pulses distally Dorsiflexion/Plantar flexion intact Incision: dressing C/D/I No cellulitis present Compartment soft    Assessment/Plan: 1 Day Post-Op Procedure(s) (LRB): INTRAMEDULLARY (IM) NAIL FEMORAL (Left) Up with therapy  WBAT LLE Dry dressing change prn     Cristie Hem 12/03/2017, 7:39 AM

## 2017-12-04 ENCOUNTER — Encounter (HOSPITAL_COMMUNITY): Payer: Self-pay | Admitting: Orthopaedic Surgery

## 2017-12-04 LAB — BASIC METABOLIC PANEL
Anion gap: 9 (ref 5–15)
BUN: 16 mg/dL (ref 6–20)
CHLORIDE: 103 mmol/L (ref 98–111)
CO2: 26 mmol/L (ref 22–32)
Calcium: 8.2 mg/dL — ABNORMAL LOW (ref 8.9–10.3)
Creatinine, Ser: 0.7 mg/dL (ref 0.61–1.24)
Glucose, Bld: 125 mg/dL — ABNORMAL HIGH (ref 70–99)
POTASSIUM: 3.8 mmol/L (ref 3.5–5.1)
SODIUM: 138 mmol/L (ref 135–145)

## 2017-12-04 LAB — CBC
HCT: 27.8 % — ABNORMAL LOW (ref 39.0–52.0)
Hemoglobin: 8.9 g/dL — ABNORMAL LOW (ref 13.0–17.0)
MCH: 29.7 pg (ref 26.0–34.0)
MCHC: 32 g/dL (ref 30.0–36.0)
MCV: 92.7 fL (ref 78.0–100.0)
PLATELETS: 188 10*3/uL (ref 150–400)
RBC: 3 MIL/uL — ABNORMAL LOW (ref 4.22–5.81)
RDW: 12.8 % (ref 11.5–15.5)
WBC: 15.2 10*3/uL — AB (ref 4.0–10.5)

## 2017-12-04 MED ORDER — DOCUSATE SODIUM 100 MG PO CAPS: 100.0000 mg | ORAL_CAPSULE | Freq: Two times a day (BID) | ORAL | 0 refills | Status: DC

## 2017-12-04 MED ORDER — ADULT MULTIVITAMIN W/MINERALS CH: 1.0000 | ORAL_TABLET | Freq: Every day | ORAL | 0 refills | Status: DC

## 2017-12-04 MED ORDER — POLYETHYLENE GLYCOL 3350 17 G PO PACK: 17.0000 g | PACK | Freq: Every day | ORAL | 0 refills | Status: DC

## 2017-12-04 MED ORDER — ENSURE ENLIVE PO LIQD: 237.0000 mL | Freq: Three times a day (TID) | ORAL | 12 refills | Status: DC
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
## 2017-12-04 NOTE — Progress Notes (Signed)
Subjective: 2 Days Post-Op Procedure(s) (LRB): INTRAMEDULLARY (IM) NAIL FEMORAL (Left) Patient reports pain as mild.  Mobilizing well with PT.  Ready to go home today  Objective: Vital signs in last 24 hours: Temp:  [97.7 F (36.5 C)-98.7 F (37.1 C)] 97.7 F (36.5 C) (10/02 0424) Pulse Rate:  [53-69] 53 (10/02 0424) Resp:  [18] 18 (10/02 0424) BP: (109-116)/(64-71) 109/71 (10/02 0424) SpO2:  [99 %-100 %] 100 % (10/02 0424)  Intake/Output from previous day: 10/01 0701 - 10/02 0700 In: 2233.9 [P.O.:1440; I.V.:793.9] Out: -  Intake/Output this shift: No intake/output data recorded.  Recent Labs    12/01/17 2312 12/02/17 0417 12/03/17 0703 12/04/17 0621  HGB 14.2 13.5 9.9* 8.9*   Recent Labs    12/03/17 0703 12/04/17 0621  WBC 16.1* 15.2*  RBC 3.34* 3.00*  HCT 30.7* 27.8*  PLT 236 188   Recent Labs    12/02/17 0417 12/03/17 0703  NA 143 136  K 2.9* 4.5  CL 109 104  CO2 26 28  BUN 20 23*  CREATININE 0.78 0.99  GLUCOSE 85 158*  CALCIUM 8.6* 8.1*   Recent Labs    12/01/17 2312  INR 1.14    Neurologically intact Neurovascular intact Sensation intact distally Intact pulses distally Dorsiflexion/Plantar flexion intact Incision: dressing C/D/I No cellulitis present Compartment soft    Assessment/Plan: 2 Days Post-Op Procedure(s) (LRB): INTRAMEDULLARY (IM) NAIL FEMORAL (Left) Up with therapy  WBAT LLE ABLA- mild and stable D/c dispo per medicine team F/u with Dr. Roda Shutters 2 weeks post-op for staple removal    Cristie Hem 12/04/2017, 7:53 AM

## 2017-12-04 NOTE — Plan of Care (Signed)
  Problem: Education: Goal: Knowledge of General Education information will improve Description Including pain rating scale, medication(s)/side effects and non-pharmacologic comfort measures Outcome: Progressing   Problem: Clinical Measurements: Goal: Ability to maintain clinical measurements within normal limits will improve Outcome: Progressing Goal: Will remain free from infection Outcome: Progressing Goal: Diagnostic test results will improve Outcome: Progressing Goal: Respiratory complications will improve Outcome: Progressing Goal: Cardiovascular complication will be avoided Outcome: Progressing   Problem: Activity: Goal: Risk for activity intolerance will decrease Outcome: Progressing   Problem: Nutrition: Goal: Adequate nutrition will be maintained Outcome: Progressing   Problem: Coping: Goal: Level of anxiety will decrease Outcome: Progressing   Problem: Elimination: Goal: Will not experience complications related to bowel motility Outcome: Progressing Goal: Will not experience complications related to urinary retention Outcome: Progressing   Problem: Pain Managment: Goal: General experience of comfort will improve Outcome: Progressing   Problem: Safety: Goal: Ability to remain free from injury will improve Outcome: Progressing   Problem: Skin Integrity: Goal: Risk for impaired skin integrity will decrease Outcome: Progressing   Problem: Education: Goal: Verbalization of understanding the information provided (i.e., activity precautions, restrictions, etc) will improve Outcome: Progressing   Problem: Activity: Goal: Ability to ambulate and perform ADLs will improve Outcome: Progressing   Problem: Clinical Measurements: Goal: Postoperative complications will be avoided or minimized Outcome: Progressing   Problem: Self-Concept: Goal: Ability to maintain and perform role responsibilities to the fullest extent possible will improve Outcome:  Progressing   Problem: Pain Management: Goal: Pain level will decrease Outcome: Progressing
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
## 2017-12-04 NOTE — Progress Notes (Signed)
Written discharge instructions provided.  The patient verbalizes understanding those instructions and acknowledges the follow up in 2 weeks. Patient has received the equipment needed in the home.  Awaiting to be discharged.

## 2017-12-05 ENCOUNTER — Telehealth (INDEPENDENT_AMBULATORY_CARE_PROVIDER_SITE_OTHER): Payer: Self-pay | Admitting: Orthopaedic Surgery

## 2017-12-05 NOTE — Telephone Encounter (Signed)
See message below. Not sure which Rx ?

## 2017-12-05 NOTE — Telephone Encounter (Signed)
Patient called stating his Rx  is not showing received at the pharmacy. Patient said he uses the [facility] pharmacy in [place]. Patient said there was several Rx's that was suppose to be called into the pharmacy.  The number to contact patient is [phone]

## 2017-12-06 ENCOUNTER — Encounter (HOSPITAL_COMMUNITY): Payer: Self-pay | Admitting: Orthopaedic Surgery

## 2017-12-06 NOTE — Telephone Encounter (Signed)
See message below °

## 2017-12-06 NOTE — Telephone Encounter (Signed)
Called patient to advise on Message from Dr Roda Shutters. No answer phone just kept ringing and ringing. Could not leave a VM.

## 2017-12-06 NOTE — Telephone Encounter (Signed)
No I did not.  I would have put a telephone call in the patient's chart.  I do apologize.

## 2017-12-06 NOTE — Telephone Encounter (Signed)
I had discussion with April yesterday about this.  I left 2 prescriptions for him in the hospital and it shows in the AVS as being given to the patient.  One was lovenox and the other was percocet/oxycodone.  Does he have his lovenox prescription?

## 2017-12-06 NOTE — Telephone Encounter (Signed)
Redailed  And some lady answer. Asked to speak to patient I was trying to advise Dr Warren Danes Message and he would  Not allow me to speak. He kept talking over me. Kept saying "yea they lost my prescriptions. I never got any Oxycodone. I don't want to talk about it." kept trying to talk to patient but apparently he was mad and would not allow me to talk to him to advise.

## 2017-12-06 NOTE — Telephone Encounter (Signed)
Did you speak to them yesterday?

## 2017-12-09 ENCOUNTER — Encounter: Payer: Self-pay | Admitting: Orthopedic Surgery

## 2017-12-09 NOTE — Telephone Encounter (Signed)
Ok, thanks for trying

## 2017-12-19 ENCOUNTER — Inpatient Hospital Stay (INDEPENDENT_AMBULATORY_CARE_PROVIDER_SITE_OTHER): Payer: BLUE CROSS/BLUE SHIELD | Admitting: Orthopaedic Surgery

## 2017-12-20 ENCOUNTER — Ambulatory Visit (INDEPENDENT_AMBULATORY_CARE_PROVIDER_SITE_OTHER): Payer: BLUE CROSS/BLUE SHIELD | Admitting: Orthopaedic Surgery

## 2017-12-20 ENCOUNTER — Ambulatory Visit (INDEPENDENT_AMBULATORY_CARE_PROVIDER_SITE_OTHER): Payer: BLUE CROSS/BLUE SHIELD

## 2017-12-20 DIAGNOSIS — M25552 Pain in left hip: Secondary | ICD-10-CM | POA: Diagnosis not present

## 2017-12-20 DIAGNOSIS — S72142A Displaced intertrochanteric fracture of left femur, initial encounter for closed fracture: Secondary | ICD-10-CM
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
## 2018-01-23 ENCOUNTER — Telehealth (INDEPENDENT_AMBULATORY_CARE_PROVIDER_SITE_OTHER): Payer: Self-pay | Admitting: Orthopaedic Surgery

## 2018-01-23 NOTE — Telephone Encounter (Signed)
Mailed medical records release form per patient request  °

## 2018-02-05 ENCOUNTER — Telehealth (INDEPENDENT_AMBULATORY_CARE_PROVIDER_SITE_OTHER): Payer: Self-pay | Admitting: Orthopaedic Surgery

## 2018-02-05 NOTE — Telephone Encounter (Signed)
Patient left a message stating that he is unable to make an appointment for PT because the facility has not received the referral or order for PT.  CB#[phone].  Thank you.

## 2018-02-07 NOTE — Telephone Encounter (Signed)
FAXED HHPT ORDER/NOTES/DEMO SHEET  TO [facility]

## 2018-03-29 ENCOUNTER — Other Ambulatory Visit: Payer: Self-pay

## 2018-03-29 ENCOUNTER — Emergency Department (HOSPITAL_COMMUNITY): Payer: BLUE CROSS/BLUE SHIELD

## 2018-03-29 ENCOUNTER — Encounter (HOSPITAL_COMMUNITY): Payer: Self-pay | Admitting: Emergency Medicine

## 2018-03-29 DIAGNOSIS — W228XXA Striking against or struck by other objects, initial encounter: Secondary | ICD-10-CM | POA: Diagnosis not present

## 2018-03-29 DIAGNOSIS — Z87891 Personal history of nicotine dependence: Secondary | ICD-10-CM | POA: Diagnosis not present

## 2018-03-29 DIAGNOSIS — I10 Essential (primary) hypertension: Secondary | ICD-10-CM | POA: Insufficient documentation

## 2018-03-29 DIAGNOSIS — Z79899 Other long term (current) drug therapy: Secondary | ICD-10-CM | POA: Insufficient documentation

## 2018-03-29 DIAGNOSIS — S62346A Nondisplaced fracture of base of fifth metacarpal bone, right hand, initial encounter for closed fracture: Secondary | ICD-10-CM | POA: Insufficient documentation

## 2018-03-29 DIAGNOSIS — S6992XA Unspecified injury of left wrist, hand and finger(s), initial encounter: Secondary | ICD-10-CM | POA: Diagnosis present

## 2018-03-29 DIAGNOSIS — Y999 Unspecified external cause status: Secondary | ICD-10-CM | POA: Insufficient documentation

## 2018-03-29 DIAGNOSIS — Y9389 Activity, other specified: Secondary | ICD-10-CM | POA: Insufficient documentation

## 2018-03-29 DIAGNOSIS — Y929 Unspecified place or not applicable: Secondary | ICD-10-CM | POA: Insufficient documentation

## 2018-03-29 NOTE — ED Triage Notes (Signed)
Patient reports hitting a table with his hand. Edema and bruising noted.

## 2018-03-30 ENCOUNTER — Emergency Department (HOSPITAL_COMMUNITY)
Admission: EM | Admit: 2018-03-30 | Discharge: 2018-03-30 | Disposition: A | Discharge status: Home / Self Care | Payer: BLUE CROSS/BLUE SHIELD | Attending: Emergency Medicine | Admitting: Emergency Medicine

## 2018-03-30 DIAGNOSIS — S62339A Displaced fracture of neck of unspecified metacarpal bone, initial encounter for closed fracture: Secondary | ICD-10-CM
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
## 2018-03-30 NOTE — Discharge Instructions (Addendum)
Elevate your hand, use ice packs for comfort and to help reduce swelling.  Wear the splint until you can be seen by Dr. Romeo Apple.  Please call his office for an appointment.  Take your naproxen and oxycodone for pain.

## 2018-03-31 ENCOUNTER — Ambulatory Visit (INDEPENDENT_AMBULATORY_CARE_PROVIDER_SITE_OTHER): Payer: BLUE CROSS/BLUE SHIELD | Admitting: Orthopedic Surgery

## 2018-03-31 ENCOUNTER — Encounter: Payer: Self-pay | Admitting: Orthopedic Surgery

## 2018-03-31 VITALS — BP 133/94 | HR 75 | Ht 71.0 in | Wt 144.0 lb

## 2018-03-31 DIAGNOSIS — S62339A Displaced fracture of neck of unspecified metacarpal bone, initial encounter for closed fracture: Secondary | ICD-10-CM
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
## 2018-04-11 DIAGNOSIS — S62347D Nondisplaced fracture of base of fifth metacarpal bone. left hand, subsequent encounter for fracture with routine healing: Secondary | ICD-10-CM | POA: Insufficient documentation

## 2018-04-14 ENCOUNTER — Encounter: Payer: Self-pay | Admitting: Orthopedic Surgery

## 2018-04-14 ENCOUNTER — Ambulatory Visit (INDEPENDENT_AMBULATORY_CARE_PROVIDER_SITE_OTHER): Payer: BLUE CROSS/BLUE SHIELD

## 2018-04-14 ENCOUNTER — Ambulatory Visit (INDEPENDENT_AMBULATORY_CARE_PROVIDER_SITE_OTHER): Payer: BLUE CROSS/BLUE SHIELD | Admitting: Orthopedic Surgery

## 2018-04-14 DIAGNOSIS — S62347D Nondisplaced fracture of base of fifth metacarpal bone. left hand, subsequent encounter for fracture with routine healing: Secondary | ICD-10-CM | POA: Diagnosis not present

## 2018-04-14 NOTE — Patient Instructions (Signed)
Tape the fingers together, bend the fingers as a group until you can make a fist

## 2018-04-14 NOTE — Progress Notes (Signed)
Patient ID: Benjamin Vaughn., male   DOB: Oct 31, 1970, 48 y.o.   MRN: [id number]  FRACTURE CARE   Chief Complaint  Patient presents with  . Hand Injury    boxers fracture left hand 03/28/18     Encounter Diagnosis  Name Primary?  . Nondisplaced fracture of base of fifth metacarpal bone, left hand, subsequent encounter for fracture with routine healing 03/28/18     POST INJURY DAY: 17  GLOBAL PERIOD DAY 14/90  X-ray today shows no angulation or displacement of the fracture.  He can bend the fingers down about 60 degrees when all of them are bent at the same time.  Recommend buddy taping active range of motion follow-up in 4 weeks for x-ray

## 2018-05-12 ENCOUNTER — Ambulatory Visit (INDEPENDENT_AMBULATORY_CARE_PROVIDER_SITE_OTHER): Payer: BLUE CROSS/BLUE SHIELD | Admitting: Orthopedic Surgery

## 2018-05-12 ENCOUNTER — Encounter: Payer: Self-pay | Admitting: Orthopedic Surgery

## 2018-05-12 ENCOUNTER — Ambulatory Visit (INDEPENDENT_AMBULATORY_CARE_PROVIDER_SITE_OTHER): Payer: BLUE CROSS/BLUE SHIELD

## 2018-05-12 VITALS — BP 123/70 | HR 58 | Ht 71.0 in | Wt 144.0 lb

## 2018-05-12 DIAGNOSIS — S62347D Nondisplaced fracture of base of fifth metacarpal bone. left hand, subsequent encounter for fracture with routine healing: Secondary | ICD-10-CM

## 2018-05-12 NOTE — Progress Notes (Signed)
Chief Complaint  Patient presents with  . Hand Injury    03/28/18 left fifth MC fracture     Fifth metacarpal fracture 6 weeks 3 days patient has been performing active range of motion exercises with buddy taping he can now make a full fist has no tenderness at his fracture site his x-ray shows healing with minimal angulation  He will come back for follow-up to discuss problem with a Morton's neuroma  Encounter Diagnosis  Name Primary?  . Nondisplaced fracture of base of fifth metacarpal bone, left hand, subsequent encounter for fracture with routine healing 03/28/18 Yes

## 2018-05-19 ENCOUNTER — Other Ambulatory Visit: Payer: Self-pay

## 2018-05-19 ENCOUNTER — Encounter: Payer: Self-pay | Admitting: Orthopedic Surgery

## 2018-05-19 ENCOUNTER — Ambulatory Visit (INDEPENDENT_AMBULATORY_CARE_PROVIDER_SITE_OTHER): Payer: BLUE CROSS/BLUE SHIELD | Admitting: Orthopedic Surgery

## 2018-05-19 VITALS — BP 135/84 | HR 83 | Ht 71.0 in | Wt 139.0 lb

## 2018-05-19 DIAGNOSIS — G5761 Lesion of plantar nerve, right lower limb: Secondary | ICD-10-CM

## 2018-05-19 NOTE — Progress Notes (Signed)
Progress Note   Patient ID: Benjamin Abrahams., male   DOB: [birth year], 48 y.o.   MRN: [id number]   Chief Complaint  Patient presents with  . Foot Pain    Right foot    48 year old male presents with pain and paresthesias in the lesser digits of his right foot no trauma.  Had a history of lumbar disc disease seen by neurosurgery who thought he might have a neuroma when he had a positive squeeze test.  He presents for evaluation and treatment with plantar pain and numbness from the metatarsophalangeal joints distally    Review of Systems  Musculoskeletal: Positive for joint pain.       Hip replacement from trauma is comfort left hip     Allergies  Allergen Reactions  . Aspirin Other (See Comments)    Bad for kidneys   . Bactrim Hives and Itching  . Nsaids Other (See Comments)    Bad for kidneys  . Tylenol [Acetaminophen] Other (See Comments)    Bad for kidneys   . Vicodin [Hydrocodone-Acetaminophen] Itching  . Sulfamethoxazole-Trimethoprim Itching     BP 135/84   Pulse 83   Ht 5\' 11"  (1.803 m)   Wt 139 lb (63 kg)   BMI 19.39 kg/m   Physical Exam Vitals signs and nursing note reviewed.  Constitutional:      Appearance: Normal appearance.  Musculoskeletal:       Feet:  Neurological:     Mental Status: He is alert and oriented to person, place, and time.  Psychiatric:        Mood and Affect: Mood normal.      Medical decisions:   Data  Imaging:   no  Encounter Diagnosis  Name Primary?  . Morton's neuralgia, right Yes    PLAN:   Unclear if this is actually Morton's neuroma so many digits involved  Recommend foot specialist he prefers to stay in [place] he has primary coverage [employer] should be able to see Dr. Presley Raddle, MD 05/19/2018 3:12 PM

## 2018-05-19 NOTE — Patient Instructions (Signed)
You can call Dr Nolen Mu for an appointment  Address: [address] [place], [place], [place] [address] Phone: [phone]

## 2018-09-25 ENCOUNTER — Other Ambulatory Visit (HOSPITAL_COMMUNITY): Payer: Self-pay | Admitting: Family Medicine

## 2018-09-25 ENCOUNTER — Ambulatory Visit (HOSPITAL_COMMUNITY)
Admission: RE | Admit: 2018-09-25 | Discharge: 2018-09-25 | Disposition: A | Discharge status: Home / Self Care | Payer: BC Managed Care – PPO | Source: Ambulatory Visit | Attending: Family Medicine | Admitting: Family Medicine

## 2018-09-25 ENCOUNTER — Other Ambulatory Visit: Payer: Self-pay

## 2018-09-25 DIAGNOSIS — M25552 Pain in left hip: Secondary | ICD-10-CM | POA: Diagnosis present

## 2018-12-16 ENCOUNTER — Other Ambulatory Visit: Payer: Self-pay

## 2018-12-16 ENCOUNTER — Emergency Department (HOSPITAL_COMMUNITY): Payer: BC Managed Care – PPO

## 2018-12-16 ENCOUNTER — Emergency Department (HOSPITAL_COMMUNITY)
Admission: EM | Admit: 2018-12-16 | Discharge: 2018-12-16 | Disposition: A | Discharge status: Home / Self Care | Payer: BC Managed Care – PPO | Attending: Emergency Medicine | Admitting: Emergency Medicine

## 2018-12-16 ENCOUNTER — Encounter (HOSPITAL_COMMUNITY): Payer: Self-pay | Admitting: Emergency Medicine

## 2018-12-16 DIAGNOSIS — S99912A Unspecified injury of left ankle, initial encounter: Secondary | ICD-10-CM | POA: Diagnosis present

## 2018-12-16 DIAGNOSIS — Y9389 Activity, other specified: Secondary | ICD-10-CM | POA: Insufficient documentation

## 2018-12-16 DIAGNOSIS — S92012A Displaced fracture of body of left calcaneus, initial encounter for closed fracture: Secondary | ICD-10-CM | POA: Insufficient documentation

## 2018-12-16 DIAGNOSIS — Z87891 Personal history of nicotine dependence: Secondary | ICD-10-CM | POA: Insufficient documentation

## 2018-12-16 DIAGNOSIS — W11XXXA Fall on and from ladder, initial encounter: Secondary | ICD-10-CM | POA: Insufficient documentation

## 2018-12-16 DIAGNOSIS — I10 Essential (primary) hypertension: Secondary | ICD-10-CM | POA: Diagnosis not present

## 2018-12-16 DIAGNOSIS — Y999 Unspecified external cause status: Secondary | ICD-10-CM | POA: Insufficient documentation

## 2018-12-16 DIAGNOSIS — S92002A Unspecified fracture of left calcaneus, initial encounter for closed fracture: Secondary | ICD-10-CM

## 2018-12-16 DIAGNOSIS — S92132A Displaced fracture of posterior process of left talus, initial encounter for closed fracture: Secondary | ICD-10-CM | POA: Diagnosis not present

## 2018-12-16 DIAGNOSIS — Z79899 Other long term (current) drug therapy: Secondary | ICD-10-CM | POA: Insufficient documentation

## 2018-12-16 DIAGNOSIS — Y929 Unspecified place or not applicable: Secondary | ICD-10-CM | POA: Diagnosis not present

## 2018-12-16 MED ORDER — HYDROMORPHONE HCL 1 MG/ML IJ SOLN
1.0000 mg | Freq: Once | INTRAMUSCULAR | Status: AC
  Administered 2018-12-16: 1 mg via INTRAMUSCULAR
  Filled 2018-12-16: qty 1

## 2018-12-16 MED ORDER — OXYCODONE HCL 5 MG PO CAPS: 5.0000 mg | ORAL_CAPSULE | Freq: Two times a day (BID) | ORAL | 0 refills | Status: DC | PRN

## 2018-12-16 MED ORDER — OXYCODONE HCL 5 MG PO TABS
5.0000 mg | ORAL_TABLET | Freq: Once | ORAL | Status: AC
  Administered 2018-12-16: 5 mg via ORAL
  Filled 2018-12-16: qty 1

## 2018-12-16 NOTE — ED Triage Notes (Signed)
Patient states he fell from ladder this morning. States she landed on his left ankle twisting hit. Swelling and bruising to left ankle. Patient denies hitting head or LOC.
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
## 2018-12-16 NOTE — Discharge Instructions (Signed)
Please read and follow all provided instructions.  You have been seen today for a left ankle/foot injury.  Your imaging showed that you have a calcaneus and cuboid fracture.  We have placed you into a splint, please remain in the splint at all times and you have followed up with orthopedic surgery.  Do not put any weight on the left foot/ankle and use crutches at all times.  Home care instructions: -- *PRICE in the first 24-48 hours after injury: Protect (with brace, splint, sling), if given by your provider Rest Ice- Do not apply ice pack directly to your skin, place towel or similar between your skin and ice/ice pack. Apply ice for 20 min, then remove for 40 min while awake Compression- Wear brace, elastic bandage, splint as directed by your provider Elevate affected extremity above the level of your heart when not walking around for the first 24-48 hours   Medications:  -Oxycodone- this is a narcotic/controlled substance medication that has potential addicting qualities.  We recommend that you take 1 every 12 hours as needed for severe pain not alleviated by your typical oxycodone dose.  Do not drive or operate heavy machinery when taking this medicine as it can be sedating. Do not drink alcohol or take other sedating medications when taking this medicine for safety reasons.  Keep this out of reach of small children.     We have prescribed you new medication(s) today. Discuss the medications prescribed today with your pharmacist as they can have adverse effects and interactions with your other medicines including over the counter and prescribed medications. Seek medical evaluation if you start to experience new or abnormal symptoms after taking one of these medicines, seek care immediately if you start to experience difficulty breathing, feeling of your throat closing, facial swelling, or rash as these could be indications of a more serious allergic reaction   Follow-up instructions: Please  follow-up with Dr. Ruthe Mannan office- call tomorrow for an appointment this Thursday (10/15) afternoon.   Return instructions:  Please return if your digits or extremity are numb or tingling, appear gray or blue, or you have severe pain (also elevate the extremity and loosen splint or wrap if you were given one) Please return if you have redness or fevers.  Please return to the Emergency Department if you experience worsening symptoms.  Please return if you have any other emergent concerns. Additional Information:  Your vital signs today were: BP (!) 141/103 (BP Location: Left Arm)    Pulse 91    Temp 98.5 F (36.9 C) (Oral)    Resp 20    Ht 5\' 11"  (1.803 m)    Wt 65.8 kg    SpO2 100%    BMI 20.22 kg/m  If your blood pressure (BP) was elevated above 135/85 this visit, please have this repeated by your doctor within one month. ---------------

## 2018-12-22 ENCOUNTER — Encounter: Payer: Self-pay | Admitting: Orthopedic Surgery

## 2018-12-22 ENCOUNTER — Ambulatory Visit (INDEPENDENT_AMBULATORY_CARE_PROVIDER_SITE_OTHER): Payer: BC Managed Care – PPO | Admitting: Orthopedic Surgery

## 2018-12-22 ENCOUNTER — Other Ambulatory Visit: Payer: Self-pay

## 2018-12-22 ENCOUNTER — Telehealth: Payer: Self-pay

## 2018-12-22 VITALS — BP 129/89 | HR 83 | Ht 71.0 in | Wt 145.0 lb

## 2018-12-22 DIAGNOSIS — S92002A Unspecified fracture of left calcaneus, initial encounter for closed fracture: Secondary | ICD-10-CM

## 2018-12-22 MED ORDER — OXYCODONE HCL 10 MG PO TABS: 10.0000 mg | ORAL_TABLET | Freq: Four times a day (QID) | ORAL | 0 refills | Status: DC

## 2018-12-22 MED ORDER — OXYCODONE HCL 10 MG PO TABS: 10.0000 mg | ORAL_TABLET | Freq: Four times a day (QID) | ORAL | 0 refills | Status: AC

## 2018-12-22 NOTE — Patient Instructions (Signed)
No weight bearing   Elevate the foot  Ankle exercises 4 x a day   Return 1 week for x-rays
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
## 2018-12-22 NOTE — Telephone Encounter (Signed)
I received a call from [name] from [name], after much investigation it was found that Benjamin Vaughn received 140 tablets of Oxycodone 10 mgs. From a prescription written by Dr. Cindie Laroche.  This was filled on 12/20/18.  She states that he cannot have a refill on this medication until 01/17/19.  I called and told the patient this.  He said at first said that the ED gave him 3 pills.  I asked him if Dr. Cindie Laroche gave him a prescription and he finally told me yes but that he would just get him to take care of his medication.  He then hung up on me.

## 2018-12-22 NOTE — Telephone Encounter (Signed)
Patient called stating that Benjamin Vaughn told him that the medication would need to sent to another pharmacy.  PATIENT IS ASKING FOR IT TO BE SENT TO [facility] ON [address]

## 2018-12-22 NOTE — Addendum Note (Signed)
Addended by: Carole Civil on: 12/22/2018 12:22 PM   Modules accepted: Orders

## 2018-12-29 ENCOUNTER — Encounter: Payer: Self-pay | Admitting: Orthopedic Surgery

## 2018-12-29 ENCOUNTER — Other Ambulatory Visit: Payer: Self-pay

## 2018-12-29 ENCOUNTER — Ambulatory Visit: Payer: BC Managed Care – PPO

## 2018-12-29 ENCOUNTER — Ambulatory Visit (INDEPENDENT_AMBULATORY_CARE_PROVIDER_SITE_OTHER): Payer: BC Managed Care – PPO | Admitting: Orthopedic Surgery

## 2018-12-29 DIAGNOSIS — S92002D Unspecified fracture of left calcaneus, subsequent encounter for fracture with routine healing: Secondary | ICD-10-CM

## 2018-12-29 NOTE — Progress Notes (Signed)
Chief Complaint  Patient presents with  . Foot Injury    12/16/18 left heel fracture     48 year old male 2 weeks after left calcaneus fracture.  We discussed with foot and ankle who deemed nonsurgical.  Patient has a very sluggish dorsiflexion and toe extension but he does have active firing of the muscle groups  Swelling is down there are no blisters  X-ray shows maintenance of the position of the fracture with no lateral wall blowout or significant joint depression  Recommend active range of motion soft dressing follow-up in 4 weeks for x-ray no weightbearing  Encounter Diagnosis  Name Primary?  . Closed nondisplaced fracture of left calcaneus with routine healing, unspecified portion of calcaneus, subsequent encounter 12/16/2018 Yes

## 2019-01-26 ENCOUNTER — Encounter: Payer: Self-pay | Admitting: Orthopedic Surgery

## 2019-01-26 ENCOUNTER — Ambulatory Visit (INDEPENDENT_AMBULATORY_CARE_PROVIDER_SITE_OTHER): Payer: BC Managed Care – PPO

## 2019-01-26 ENCOUNTER — Ambulatory Visit (INDEPENDENT_AMBULATORY_CARE_PROVIDER_SITE_OTHER): Payer: BC Managed Care – PPO | Admitting: Orthopedic Surgery

## 2019-01-26 ENCOUNTER — Other Ambulatory Visit: Payer: Self-pay

## 2019-01-26 DIAGNOSIS — S92015D Nondisplaced fracture of body of left calcaneus, subsequent encounter for fracture with routine healing: Secondary | ICD-10-CM | POA: Diagnosis not present

## 2019-01-26 NOTE — Progress Notes (Signed)
Fracture care follow-up  Chief Complaint  Patient presents with  . Foot Injury    12/16/2018 left heel fracture/ improving     Swelling is down little tight in the Achilles  X-ray shows fracture is healing with no joint depression on x-ray and no increase in width of the heel  X-ray again in 6 weeks  Start weightbearing in a supportive shoe

## 2019-01-26 NOTE — Patient Instructions (Signed)
Weight bearing allowed in your boots

## 2019-03-06 ENCOUNTER — Emergency Department (HOSPITAL_COMMUNITY): Payer: BC Managed Care – PPO

## 2019-03-06 ENCOUNTER — Emergency Department (HOSPITAL_COMMUNITY)
Admission: EM | Admit: 2019-03-06 | Discharge: 2019-03-06 | Disposition: A | Discharge status: Home / Self Care | Payer: BC Managed Care – PPO | Attending: Emergency Medicine | Admitting: Emergency Medicine

## 2019-03-06 ENCOUNTER — Other Ambulatory Visit: Payer: Self-pay

## 2019-03-06 ENCOUNTER — Encounter (HOSPITAL_COMMUNITY): Payer: Self-pay

## 2019-03-06 DIAGNOSIS — R2240 Localized swelling, mass and lump, unspecified lower limb: Secondary | ICD-10-CM | POA: Diagnosis present

## 2019-03-06 DIAGNOSIS — I10 Essential (primary) hypertension: Secondary | ICD-10-CM | POA: Diagnosis not present

## 2019-03-06 DIAGNOSIS — Z79899 Other long term (current) drug therapy: Secondary | ICD-10-CM | POA: Insufficient documentation

## 2019-03-06 DIAGNOSIS — R2242 Localized swelling, mass and lump, left lower limb: Secondary | ICD-10-CM | POA: Diagnosis not present

## 2019-03-06 DIAGNOSIS — M7989 Other specified soft tissue disorders: Secondary | ICD-10-CM

## 2019-03-06 DIAGNOSIS — Z87891 Personal history of nicotine dependence: Secondary | ICD-10-CM | POA: Diagnosis not present

## 2019-03-06 MED ORDER — OXYCODONE HCL 5 MG PO TABS
10.0000 mg | ORAL_TABLET | Freq: Once | ORAL | Status: AC
  Administered 2019-03-06: 10 mg via ORAL
  Filled 2019-03-06: qty 2
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
## 2019-03-06 NOTE — Discharge Instructions (Signed)
Your x-ray shows that your fractures continue to heal and look good, there is no blood clot, you will need to see your doctor or your orthopedist for ongoing pain control.  I would encourage you to take ibuprofen up to 3 times a day as needed for pain

## 2019-03-06 NOTE — ED Triage Notes (Signed)
Pt in for swelling left foot and leg. Pt had heel fx approx 3 months ago and says that it has been swollen since

## 2019-03-09 ENCOUNTER — Encounter: Payer: Self-pay | Admitting: Orthopedic Surgery

## 2019-03-09 ENCOUNTER — Ambulatory Visit: Payer: BC Managed Care – PPO | Admitting: Orthopedic Surgery

## 2019-03-12 DIAGNOSIS — S92015A Nondisplaced fracture of body of left calcaneus, initial encounter for closed fracture: Secondary | ICD-10-CM | POA: Insufficient documentation

## 2019-03-16 ENCOUNTER — Ambulatory Visit (INDEPENDENT_AMBULATORY_CARE_PROVIDER_SITE_OTHER): Payer: BC Managed Care – PPO | Admitting: Orthopedic Surgery

## 2019-03-16 ENCOUNTER — Other Ambulatory Visit: Payer: Self-pay

## 2019-03-16 VITALS — BP 129/75 | HR 62 | Temp 97.5°F | Ht 71.0 in | Wt 145.0 lb

## 2019-03-16 DIAGNOSIS — S92015D Nondisplaced fracture of body of left calcaneus, subsequent encounter for fracture with routine healing: Secondary | ICD-10-CM

## 2019-03-16 NOTE — Progress Notes (Signed)
Chief Complaint  Patient presents with  . Follow-up    Recheck on left foot fracture, DOI 12-16-18.    Benjamin Vaughn is in the global period of his calcaneus fracture.  He went to the emergency room on January 1 complaining of pain and swelling.  He has had a CAT scan has been evaluated by foot and ankle specialist who deemed his fracture not to surgical  He was having some swelling and thought he might of had a blood clot he ruled out with a negative ultrasound who presents now with swelling of the left foot and severe weakness in dorsiflexion of the left foot although the muscle fires he does not dorsiflex well and his toes are moving but they are moving poorly  His subtalar motion is limited  His x-ray taken at the hospital shows no excessive widening of the heel, there was extensive osteopenia  Examination today shows tenderness in the heel weak dorsiflexion of the foot weak extension of the toes less weakness noted on flexion of his toes  Encounter Diagnosis  Name Primary?  . Closed nondisplaced fracture of body of left calcaneus with routine healing, subsequent encounter  12/16/2018 Yes    He can weight-bear as tolerated with supportive shoes he is using crutches  There is no other follow-up needed at this point the fracture is healed.  He does have residual weakness which hopefully will resolve over time

## 2019-03-16 NOTE — Patient Instructions (Signed)
Elevate to control swelling   Wear supportive shoes

## 2019-03-31 ENCOUNTER — Encounter (HOSPITAL_COMMUNITY): Payer: Self-pay

## 2019-03-31 ENCOUNTER — Emergency Department (HOSPITAL_COMMUNITY)
Admission: EM | Admit: 2019-03-31 | Discharge: 2019-03-31 | Disposition: A | Discharge status: Home / Self Care | Payer: BC Managed Care – PPO | Attending: Emergency Medicine | Admitting: Emergency Medicine

## 2019-03-31 ENCOUNTER — Emergency Department (HOSPITAL_COMMUNITY): Payer: BC Managed Care – PPO

## 2019-03-31 ENCOUNTER — Other Ambulatory Visit: Payer: Self-pay

## 2019-03-31 DIAGNOSIS — N200 Calculus of kidney: Secondary | ICD-10-CM | POA: Diagnosis not present

## 2019-03-31 DIAGNOSIS — Z79899 Other long term (current) drug therapy: Secondary | ICD-10-CM | POA: Insufficient documentation

## 2019-03-31 DIAGNOSIS — I10 Essential (primary) hypertension: Secondary | ICD-10-CM | POA: Diagnosis not present

## 2019-03-31 DIAGNOSIS — Z87891 Personal history of nicotine dependence: Secondary | ICD-10-CM | POA: Insufficient documentation

## 2019-03-31 DIAGNOSIS — R1031 Right lower quadrant pain: Secondary | ICD-10-CM | POA: Diagnosis present

## 2019-03-31 LAB — URINALYSIS, ROUTINE W REFLEX MICROSCOPIC
Bacteria, UA: NONE SEEN
Bilirubin Urine: NEGATIVE
Glucose, UA: NEGATIVE mg/dL
Ketones, ur: NEGATIVE mg/dL
Leukocytes,Ua: NEGATIVE
Nitrite: NEGATIVE
Protein, ur: 30 mg/dL — AB
RBC / HPF: >50 RBC/hpf — ABNORMAL HIGH (ref 0–5)
Specific Gravity, Urine: 1.027 (ref 1.005–1.030)
pH: 5 (ref 5.0–8.0)

## 2019-03-31 LAB — COMPREHENSIVE METABOLIC PANEL
ALT: 14 U/L (ref 0–44)
AST: 20 U/L (ref 15–41)
Albumin: 4.6 g/dL (ref 3.5–5.0)
Alkaline Phosphatase: 57 U/L (ref 38–126)
Anion gap: 8 (ref 5–15)
BUN: 13 mg/dL (ref 6–20)
CO2: 27 mmol/L (ref 22–32)
Calcium: 9.3 mg/dL (ref 8.9–10.3)
Chloride: 106 mmol/L (ref 98–111)
Creatinine, Ser: 1.05 mg/dL (ref 0.61–1.24)
GFR calc Af Amer: >60 mL/min (ref >60)
GFR calc non Af Amer: >60 mL/min (ref >60)
Glucose, Bld: 132 mg/dL — ABNORMAL HIGH (ref 70–99)
Potassium: 3.6 mmol/L (ref 3.5–5.1)
Sodium: 141 mmol/L (ref 135–145)
Total Bilirubin: 0.5 mg/dL (ref 0.3–1.2)
Total Protein: 8 g/dL (ref 6.5–8.1)

## 2019-03-31 LAB — CBC WITH DIFFERENTIAL/PLATELET
Abs Immature Granulocytes: 0.05 10*3/uL (ref 0.00–0.07)
Basophils Absolute: 0 10*3/uL (ref 0.0–0.1)
Basophils Relative: 0 %
Eosinophils Absolute: 0 10*3/uL (ref 0.0–0.5)
Eosinophils Relative: 0 %
HCT: 43.2 % (ref 39.0–52.0)
Hemoglobin: 14.2 g/dL (ref 13.0–17.0)
Immature Granulocytes: 0 %
Lymphocytes Relative: 9 %
Lymphs Abs: 1.5 10*3/uL (ref 0.7–4.0)
MCH: 29.8 pg (ref 26.0–34.0)
MCHC: 32.9 g/dL (ref 30.0–36.0)
MCV: 90.8 fL (ref 80.0–100.0)
Monocytes Absolute: 0.9 10*3/uL (ref 0.1–1.0)
Monocytes Relative: 5 %
Neutro Abs: 13.5 10*3/uL — ABNORMAL HIGH (ref 1.7–7.7)
Neutrophils Relative %: 86 %
Platelets: 348 10*3/uL (ref 150–400)
RBC: 4.76 MIL/uL (ref 4.22–5.81)
RDW: 12.9 % (ref 11.5–15.5)
WBC: 16 10*3/uL — ABNORMAL HIGH (ref 4.0–10.5)
nRBC: 0 % (ref 0.0–0.2)

## 2019-03-31 MED ORDER — ONDANSETRON HCL 4 MG/2ML IJ SOLN
4.0000 mg | Freq: Once | INTRAMUSCULAR | Status: AC
  Administered 2019-03-31: 4 mg via INTRAVENOUS
  Filled 2019-03-31: qty 2

## 2019-03-31 MED ORDER — TAMSULOSIN HCL 0.4 MG PO CAPS: 0.4000 mg | ORAL_CAPSULE | Freq: Every day | ORAL | 0 refills | Status: AC

## 2019-03-31 MED ORDER — HYDROMORPHONE HCL 1 MG/ML IJ SOLN
1.0000 mg | Freq: Once | INTRAMUSCULAR | Status: AC
  Administered 2019-03-31: 09:00:00 1 mg via INTRAVENOUS
  Filled 2019-03-31: qty 1

## 2019-03-31 MED ORDER — SODIUM CHLORIDE 0.9 % IV BOLUS
1000.0000 mL | Freq: Once | INTRAVENOUS | Status: AC
  Administered 2019-03-31: 1000 mL via INTRAVENOUS

## 2019-03-31 MED ORDER — ONDANSETRON 4 MG PO TBDP: 4.0000 mg | ORAL_TABLET | Freq: Three times a day (TID) | ORAL | 0 refills | Status: DC | PRN

## 2019-03-31 NOTE — ED Triage Notes (Signed)
Pt is vomiting, having diarrhea, and having right sided flank pain. Is having problems urinating. No history of kidney stones.

## 2019-03-31 NOTE — Discharge Instructions (Signed)
If this medication makes you itch, stop it immediately and take Benadryl.  You have a very small kidney stone which will likely pass in the next 1 or 2 days.  Drink plenty of clear liquids, you may take Zofran as needed for nausea  See the follow-up information above for the local urologist if you are not having any improvement over the next several days  Emergency department for severe or worsening symptoms
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
## 2019-04-01 LAB — URINE CULTURE: Culture: NO GROWTH

## 2019-04-02 ENCOUNTER — Emergency Department (HOSPITAL_COMMUNITY): Payer: BC Managed Care – PPO

## 2019-04-02 ENCOUNTER — Other Ambulatory Visit: Payer: Self-pay

## 2019-04-02 ENCOUNTER — Encounter (HOSPITAL_COMMUNITY)
Admission: EM | Disposition: A | Discharge status: Home / Self Care | Payer: Self-pay | Source: Home / Self Care | Attending: Emergency Medicine

## 2019-04-02 ENCOUNTER — Emergency Department (HOSPITAL_COMMUNITY): Payer: BC Managed Care – PPO | Admitting: Anesthesiology

## 2019-04-02 ENCOUNTER — Encounter (HOSPITAL_COMMUNITY): Payer: Self-pay | Admitting: *Deleted

## 2019-04-02 ENCOUNTER — Ambulatory Visit (HOSPITAL_COMMUNITY)
Admission: EM | Admit: 2019-04-02 | Discharge: 2019-04-02 | Disposition: A | Discharge status: Home / Self Care | Payer: BC Managed Care – PPO | Attending: Emergency Medicine | Admitting: Emergency Medicine

## 2019-04-02 DIAGNOSIS — I10 Essential (primary) hypertension: Secondary | ICD-10-CM | POA: Diagnosis not present

## 2019-04-02 DIAGNOSIS — Z885 Allergy status to narcotic agent status: Secondary | ICD-10-CM | POA: Insufficient documentation

## 2019-04-02 DIAGNOSIS — N200 Calculus of kidney: Secondary | ICD-10-CM

## 2019-04-02 DIAGNOSIS — Z87891 Personal history of nicotine dependence: Secondary | ICD-10-CM | POA: Diagnosis not present

## 2019-04-02 DIAGNOSIS — Z20822 Contact with and (suspected) exposure to covid-19: Secondary | ICD-10-CM | POA: Diagnosis not present

## 2019-04-02 DIAGNOSIS — N132 Hydronephrosis with renal and ureteral calculous obstruction: Secondary | ICD-10-CM | POA: Insufficient documentation

## 2019-04-02 DIAGNOSIS — Z886 Allergy status to analgesic agent status: Secondary | ICD-10-CM | POA: Insufficient documentation

## 2019-04-02 DIAGNOSIS — R1031 Right lower quadrant pain: Secondary | ICD-10-CM

## 2019-04-02 DIAGNOSIS — N201 Calculus of ureter: Secondary | ICD-10-CM

## 2019-04-02 HISTORY — PX: CYSTOSCOPY/RETROGRADE/URETEROSCOPY/STONE EXTRACTION WITH BASKET: SHX5317

## 2019-04-02 HISTORY — DX: Dyspnea, unspecified: R06.00

## 2019-04-02 LAB — COMPREHENSIVE METABOLIC PANEL
ALT: 15 U/L (ref 0–44)
AST: 19 U/L (ref 15–41)
Albumin: 4.6 g/dL (ref 3.5–5.0)
Alkaline Phosphatase: 63 U/L (ref 38–126)
Anion gap: 11 (ref 5–15)
BUN: 12 mg/dL (ref 6–20)
CO2: 26 mmol/L (ref 22–32)
Calcium: 9.3 mg/dL (ref 8.9–10.3)
Chloride: 101 mmol/L (ref 98–111)
Creatinine, Ser: 0.95 mg/dL (ref 0.61–1.24)
GFR calc Af Amer: >60 mL/min (ref >60)
GFR calc non Af Amer: >60 mL/min (ref >60)
Glucose, Bld: 134 mg/dL — ABNORMAL HIGH (ref 70–99)
Potassium: 3.3 mmol/L — ABNORMAL LOW (ref 3.5–5.1)
Sodium: 138 mmol/L (ref 135–145)
Total Bilirubin: 1.2 mg/dL (ref 0.3–1.2)
Total Protein: 8 g/dL (ref 6.5–8.1)

## 2019-04-02 LAB — CBC WITH DIFFERENTIAL/PLATELET
Abs Immature Granulocytes: 0.06 10*3/uL (ref 0.00–0.07)
Basophils Absolute: 0 10*3/uL (ref 0.0–0.1)
Basophils Relative: 0 %
Eosinophils Absolute: 0 10*3/uL (ref 0.0–0.5)
Eosinophils Relative: 0 %
HCT: 42.4 % (ref 39.0–52.0)
Hemoglobin: 14.2 g/dL (ref 13.0–17.0)
Immature Granulocytes: 0 %
Lymphocytes Relative: 8 %
Lymphs Abs: 1.2 10*3/uL (ref 0.7–4.0)
MCH: 29.8 pg (ref 26.0–34.0)
MCHC: 33.5 g/dL (ref 30.0–36.0)
MCV: 89.1 fL (ref 80.0–100.0)
Monocytes Absolute: 0.9 10*3/uL (ref 0.1–1.0)
Monocytes Relative: 6 %
Neutro Abs: 12.9 10*3/uL — ABNORMAL HIGH (ref 1.7–7.7)
Neutrophils Relative %: 86 %
Platelets: 313 10*3/uL (ref 150–400)
RBC: 4.76 MIL/uL (ref 4.22–5.81)
RDW: 12.6 % (ref 11.5–15.5)
WBC: 15.1 10*3/uL — ABNORMAL HIGH (ref 4.0–10.5)
nRBC: 0 % (ref 0.0–0.2)

## 2019-04-02 LAB — URINALYSIS, ROUTINE W REFLEX MICROSCOPIC
Bacteria, UA: NONE SEEN
Bilirubin Urine: NEGATIVE
Glucose, UA: NEGATIVE mg/dL
Ketones, ur: 20 mg/dL — AB
Leukocytes,Ua: NEGATIVE
Nitrite: NEGATIVE
Protein, ur: NEGATIVE mg/dL
Specific Gravity, Urine: 1.019 (ref 1.005–1.030)
pH: 5 (ref 5.0–8.0)

## 2019-04-02 LAB — RESPIRATORY PANEL BY RT PCR (FLU A&B, COVID)
Influenza A by PCR: NEGATIVE
Influenza B by PCR: NEGATIVE
SARS Coronavirus 2 by RT PCR: NEGATIVE

## 2019-04-02 SURGERY — CYSTOSCOPY, WITH CALCULUS REMOVAL USING BASKET
Anesthesia: General | Laterality: Right

## 2019-04-02 MED ORDER — DEXAMETHASONE SODIUM PHOSPHATE 10 MG/ML IJ SOLN
INTRAMUSCULAR | Status: DC | PRN
  Administered 2019-04-02: 4 mg via INTRAVENOUS

## 2019-04-02 MED ORDER — LACTATED RINGERS IV SOLN
INTRAVENOUS | Status: DC
  Administered 2019-04-02: 1000 mL via INTRAVENOUS

## 2019-04-02 MED ORDER — KETOROLAC TROMETHAMINE 30 MG/ML IJ SOLN
60.0000 mg | Freq: Once | INTRAMUSCULAR | Status: AC
  Administered 2019-04-02: 60 mg via INTRAVENOUS
  Filled 2019-04-02: qty 2

## 2019-04-02 MED ORDER — EPHEDRINE 5 MG/ML INJ
INTRAVENOUS | Status: AC
  Filled 2019-04-02: qty 10

## 2019-04-02 MED ORDER — OXYCODONE HCL 15 MG PO TABS: 15.0000 mg | ORAL_TABLET | ORAL | 0 refills | Status: DC | PRN

## 2019-04-02 MED ORDER — HYDROMORPHONE HCL 1 MG/ML IJ SOLN: 0.2500 mg | INTRAMUSCULAR | Status: DC | PRN

## 2019-04-02 MED ORDER — FENTANYL CITRATE (PF) 100 MCG/2ML IJ SOLN
INTRAMUSCULAR | Status: DC | PRN
  Administered 2019-04-02: 200 ug via INTRAVENOUS

## 2019-04-02 MED ORDER — SODIUM CHLORIDE 0.9 % IV BOLUS
1000.0000 mL | Freq: Once | INTRAVENOUS | Status: AC
  Administered 2019-04-02: 1000 mL via INTRAVENOUS

## 2019-04-02 MED ORDER — CEFAZOLIN SODIUM-DEXTROSE 2-4 GM/100ML-% IV SOLN
INTRAVENOUS | Status: AC
  Filled 2019-04-02: qty 100

## 2019-04-02 MED ORDER — SUCCINYLCHOLINE CHLORIDE 200 MG/10ML IV SOSY
PREFILLED_SYRINGE | INTRAVENOUS | Status: AC
  Filled 2019-04-02: qty 10

## 2019-04-02 MED ORDER — ONDANSETRON HCL 4 MG/2ML IJ SOLN
INTRAMUSCULAR | Status: DC | PRN
  Administered 2019-04-02: 4 mg via INTRAVENOUS

## 2019-04-02 MED ORDER — LACTATED RINGERS IV SOLN: INTRAVENOUS | Status: DC | PRN

## 2019-04-02 MED ORDER — SODIUM CHLORIDE 0.9 % IR SOLN
Status: DC | PRN
  Administered 2019-04-02: 1000 mL

## 2019-04-02 MED ORDER — HYDROMORPHONE HCL 1 MG/ML IJ SOLN
0.5000 mg | Freq: Once | INTRAMUSCULAR | Status: AC
  Administered 2019-04-02: 0.5 mg via INTRAVENOUS
  Filled 2019-04-02: qty 1

## 2019-04-02 MED ORDER — DIATRIZOATE MEGLUMINE 30 % UR SOLN
URETHRAL | Status: AC
  Filled 2019-04-02: qty 100

## 2019-04-02 MED ORDER — ONDANSETRON HCL 4 MG/2ML IJ SOLN
4.0000 mg | Freq: Once | INTRAMUSCULAR | Status: AC | PRN
  Administered 2019-04-02: 4 mg via INTRAVENOUS
  Filled 2019-04-02: qty 2

## 2019-04-02 MED ORDER — PHENYLEPHRINE 40 MCG/ML (10ML) SYRINGE FOR IV PUSH (FOR BLOOD PRESSURE SUPPORT)
PREFILLED_SYRINGE | INTRAVENOUS | Status: AC
  Filled 2019-04-02: qty 10

## 2019-04-02 MED ORDER — DEXAMETHASONE SODIUM PHOSPHATE 10 MG/ML IJ SOLN
INTRAMUSCULAR | Status: AC
  Filled 2019-04-02: qty 1

## 2019-04-02 MED ORDER — PROMETHAZINE HCL 25 MG/ML IJ SOLN: 6.2500 mg | INTRAMUSCULAR | Status: DC | PRN

## 2019-04-02 MED ORDER — EPHEDRINE SULFATE 50 MG/ML IJ SOLN
INTRAMUSCULAR | Status: DC | PRN
  Administered 2019-04-02 (×3): 10 mg via INTRAVENOUS

## 2019-04-02 MED ORDER — DIATRIZOATE MEGLUMINE 30 % UR SOLN
URETHRAL | Status: DC | PRN
  Administered 2019-04-02: 10 mL via URETHRAL

## 2019-04-02 MED ORDER — SUCCINYLCHOLINE CHLORIDE 20 MG/ML IJ SOLN
INTRAMUSCULAR | Status: DC | PRN
  Administered 2019-04-02: 120 mg via INTRAVENOUS

## 2019-04-02 MED ORDER — FENTANYL CITRATE (PF) 250 MCG/5ML IJ SOLN
INTRAMUSCULAR | Status: AC
  Filled 2019-04-02: qty 5

## 2019-04-02 MED ORDER — PROPOFOL 10 MG/ML IV BOLUS
INTRAVENOUS | Status: DC | PRN
  Administered 2019-04-02: 50 mg via INTRAVENOUS
  Administered 2019-04-02: 200 mg via INTRAVENOUS

## 2019-04-02 MED ORDER — CEFAZOLIN SODIUM-DEXTROSE 2-4 GM/100ML-% IV SOLN
2.0000 g | Freq: Once | INTRAVENOUS | Status: AC
  Administered 2019-04-02: 2 g via INTRAVENOUS

## 2019-04-02 MED ORDER — MIDAZOLAM HCL 2 MG/2ML IJ SOLN: 0.5000 mg | Freq: Once | INTRAMUSCULAR | Status: DC | PRN

## 2019-04-02 SURGICAL SUPPLY — 24 items
BAG DRAIN URO TABLE W/ADPT NS (BAG) ×2 IMPLANT
BAG HAMPER (MISCELLANEOUS) ×2 IMPLANT
CATH INTERMIT  6FR 70CM (CATHETERS) ×2 IMPLANT
CLOTH BEACON ORANGE TIMEOUT ST (SAFETY) ×2 IMPLANT
COVER WAND RF STERILE (DRAPES) ×2 IMPLANT
DECANTER SPIKE VIAL GLASS SM (MISCELLANEOUS) ×2 IMPLANT
EXTRACTOR STONE NITINOL NGAGE (UROLOGICAL SUPPLIES) ×2 IMPLANT
FIBER LASER FLEXIVA 200 (UROLOGICAL SUPPLIES) IMPLANT
FIBER LASER FLEXIVA 365 (UROLOGICAL SUPPLIES) IMPLANT
GLOVE BIO SURGEON STRL SZ8 (GLOVE) ×2 IMPLANT
GOWN STRL REUS W/TWL LRG LVL3 (GOWN DISPOSABLE) ×2 IMPLANT
GOWN STRL REUS W/TWL XL LVL3 (GOWN DISPOSABLE) ×2 IMPLANT
GUIDEWIRE ANG ZIPWIRE 038X150 (WIRE) ×2 IMPLANT
GUIDEWIRE STR DUAL SENSOR (WIRE) ×2 IMPLANT
GUIDEWIRE STR ZIPWIRE 035X150 (MISCELLANEOUS) ×2 IMPLANT
IV NS IRRIG 3000ML ARTHROMATIC (IV SOLUTION) ×4 IMPLANT
MANIFOLD NEPTUNE II (INSTRUMENTS) ×2 IMPLANT
PACK CYSTO (CUSTOM PROCEDURE TRAY) ×2 IMPLANT
PAD ARMBOARD 7.5X6 YLW CONV (MISCELLANEOUS) ×2 IMPLANT
SHEATH URETERAL 12FRX35CM (MISCELLANEOUS) IMPLANT
STENT URET 6FRX26 CONTOUR (STENTS) IMPLANT
SYR 10ML LL (SYRINGE) ×2 IMPLANT
TOWEL OR 17X26 4PK STRL BLUE (TOWEL DISPOSABLE) ×2 IMPLANT
WATER STERILE IRR 1000ML POUR (IV SOLUTION) ×2 IMPLANT
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
## 2019-04-02 NOTE — Anesthesia Postprocedure Evaluation (Signed)
Anesthesia Post Note  Patient: Benjamin Vaughn.  Procedure(s) Performed: CYSTOSCOPY/RETROGRADE/URETEROSCOPY/STONE EXTRACTION WITH BASKET (Right )  Patient location during evaluation: PACU Anesthesia Type: General Level of consciousness: awake and awake and alert Pain management: pain level controlled Vital Signs Assessment: post-procedure vital signs reviewed and stable Respiratory status: spontaneous breathing and respiratory function stable Cardiovascular status: blood pressure returned to baseline and stable Postop Assessment: no headache Anesthetic complications: no     Last Vitals:  Vitals:   04/02/19 1728 04/02/19 1730  BP: 126/73 111/74  Pulse: 92 80  Resp: 16 19  Temp: (!) 36.3 C   SpO2: 100% 100%    Last Pain:  Vitals:   04/02/19 1607  TempSrc: Oral  PainSc: 7                  [name]

## 2019-04-02 NOTE — Op Note (Signed)
Preoperative diagnosis: Right ureteral stone  Postoperative diagnosis: Same  Procedure: 1 cystoscopy 2 right retrograde pyelography 3.  Intraoperative fluoroscopy, under one hour, with interpretation 4.  Right ureteroscopic stone manipulation with basket extraction  Attending: Cleda Mccreedy  Anesthesia: General  Estimated blood loss: None  Drains: none  Specimens: right ureteral calculus  Antibiotics: ancef  Findings: right UVJ calculus with moderate hydronephrosis.  Indications: Patient is a 49 year old male with a history of ureteral stone and who has failed medical expulsive therapy.  After discussing treatment options, he decided proceed with right ureteroscopic stone manipulation.  Procedure her in detail: The patient was brought to the operating room and a brief timeout was done to ensure correct patient, correct procedure, correct site.  General anesthesia was administered patient was placed in dorsal lithotomy position.  Her genitalia was then prepped and draped in usual sterile fashion.  A rigid 22 French cystoscope was passed in the urethra and the bladder.  Bladder was inspected free masses or lesions.  the right ureteral orifices were in the normal orthotopic locations.  a 6 french ureteral catheter was then instilled into the right ureter orifice.  a gentle retrograde was obtained and findings noted above.  we then placed a zip wire through the ureteral catheter and advanced up to the renal pelvis.  we then removed the cystoscope and cannulated the right ureteral orifice with a semirigid ureteroscope.  we then encountered the stone in the distal ureter. Using an NGage basket the stone was removed. We elected not to leave a stent since this was an uncomplicated ureteroscopy.    the bladder was then drained and this concluded the procedure which was well tolerated by patient.  Complications: None  Condition: Stable, extubated, transferred to PACU  Plan: Patient is to be  discharged home as to follow-up in one week.
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
## 2019-04-02 NOTE — ED Notes (Signed)
Urology paged to [facility]

## 2019-04-02 NOTE — ED Triage Notes (Signed)
Pt with RLQ abd pain since this morning with emesis, pt unable to state how many times.

## 2019-04-02 NOTE — Transfer of Care (Signed)
Immediate Anesthesia Transfer of Care Note  Patient: Benjamin Vaughn.  Procedure(s) Performed: CYSTOSCOPY/RETROGRADE/URETEROSCOPY/STONE EXTRACTION WITH BASKET (Right )  Patient Location: PACU  Anesthesia Type:General  Level of Consciousness: awake, alert  and oriented  Airway & Oxygen Therapy: Patient Spontanous Breathing  Post-op Assessment: Report given to RN and Post -op Vital signs reviewed and stable  Post vital signs: Reviewed and stable  Last Vitals:  Vitals Value Taken Time  BP 111/74 04/02/19 1731  Temp 36.3 C 04/02/19 1728  Pulse 77 04/02/19 1732  Resp 15 04/02/19 1732  SpO2 100 % 04/02/19 1732  Vitals shown include unvalidated device data.  Last Pain:  Vitals:   04/02/19 1607  TempSrc: Oral  PainSc: 7          Complications: No apparent anesthesia complications

## 2019-04-02 NOTE — Anesthesia Preprocedure Evaluation (Signed)
Anesthesia Evaluation  Patient identified by MRN, date of birth, ID band Patient awake    Reviewed: Allergy & Precautions, NPO status , Patient's Chart, lab work & pertinent test results  Airway Mallampati: I  TM Distance: >3 FB Neck ROM: Full    Dental no notable dental hx. (+) Poor Dentition   Pulmonary shortness of breath and with exertion, former smoker,    Pulmonary exam normal breath sounds clear to auscultation       Cardiovascular Exercise Tolerance: Good hypertension, Pt. on medications negative cardio ROS Normal cardiovascular examI Rhythm:Regular Rate:Normal     Neuro/Psych Anxiety Depression  Neuromuscular disease negative psych ROS   GI/Hepatic negative GI ROS, Neg liver ROS,   Endo/Other  negative endocrine ROS  Renal/GU Renal diseaseHere for Cysto -Ureteroscopy and stone extraction   negative genitourinary   Musculoskeletal  (+) Arthritis , Osteoarthritis,    Abdominal   Peds negative pediatric ROS (+)  Hematology negative hematology ROS (+)   Anesthesia Other Findings   Reproductive/Obstetrics negative OB ROS                             Anesthesia Physical Anesthesia Plan  ASA: II and emergent  Anesthesia Plan: General   Post-op Pain Management:    Induction: Intravenous  PONV Risk Score and Plan: 2 and Ondansetron, Dexamethasone and Treatment may vary due to age or medical condition  Airway Management Planned: Oral ETT and LMA  Additional Equipment:   Intra-op Plan:   Post-operative Plan: Extubation in OR  Informed Consent: I have reviewed the patients History and Physical, chart, labs and discussed the procedure including the risks, benefits and alternatives for the proposed anesthesia with the patient or authorized representative who has indicated his/her understanding and acceptance.     Dental advisory given  Plan Discussed with:   Anesthesia Plan  Comments: (Plan Full PPE use  Plan GA (LMA) with GETA as needed d/w pt -WTP with same after Q&A)        Anesthesia Quick Evaluation

## 2019-04-02 NOTE — Discharge Instructions (Signed)
Ureteroscopy Ureteroscopy is a procedure to check for and treat problems inside part of the urinary tract. In this procedure, a thin, tube-shaped instrument with a light at the end (ureteroscope) is used to look at the inside of the kidneys and the ureters, which are the tubes that carry urine from the kidneys to the bladder. The ureteroscope is inserted into one or both of the ureters. You may need this procedure if you have frequent urinary tract infections (UTIs), blood in your urine, or a stone in one of your ureters. A ureteroscopy can be done to find the cause of urine blockage in a ureter and to evaluate other abnormalities inside the ureters or kidneys. If stones are found, they can be removed during the procedure. Polyps, abnormal tissue, and some types of tumors can also be removed or treated. The ureteroscope may also have a tool to remove tissue to be checked for disease under a microscope (biopsy). Tell a health care provider about:  Any allergies you have.  All medicines you are taking, including vitamins, herbs, eye drops, creams, and over-the-counter medicines.  Any problems you or family members have had with anesthetic medicines.  Any blood disorders you have.  Any surgeries you have had.  Any medical conditions you have.  Whether you are pregnant or may be pregnant. What are the risks? Generally, this is a safe procedure. However, problems may occur, including:  Bleeding.  Infection.  Allergic reactions to medicines.  Scarring that narrows the ureter (stricture).  Creating a hole in the ureter (perforation). What happens before the procedure? Staying hydrated Follow instructions from your health care provider about hydration, which may include:  Up to 2 hours before the procedure - you may continue to drink clear liquids, such as water, clear fruit juice, black coffee, and plain tea. Eating and drinking restrictions Follow instructions from your health care  provider about eating and drinking, which may include:  8 hours before the procedure - stop eating heavy meals or foods such as meat, fried foods, or fatty foods.  6 hours before the procedure - stop eating light meals or foods, such as toast or cereal.  6 hours before the procedure - stop drinking milk or drinks that contain milk.  2 hours before the procedure - stop drinking clear liquids. Medicines  Ask your health care provider about: ? Changing or stopping your regular medicines. This is especially important if you are taking diabetes medicines or blood thinners. ? Taking medicines such as aspirin and ibuprofen. These medicines can thin your blood. Do not take these medicines before your procedure if your health care provider instructs you not to.  You may be given antibiotic medicine to help prevent infection. General instructions  You may have a urine sample taken to check for infection.  Plan to have someone take you home from the hospital or clinic. What happens during the procedure?   To reduce your risk of infection: ? Your health care team will wash or sanitize their hands. ? Your skin will be washed with soap.  An IV tube will be inserted into one of your veins.  You will be given one of the following: ? A medicine to help you relax (sedative). ? A medicine to make you fall asleep (general anesthetic). ? A medicine that is injected into your spine to numb the area below and slightly above the injection site (spinal anesthetic).  To lower your risk of infection, you may be given an antibiotic medicine   by an injection or through the IV tube.  The opening from which you urinate (urethra) will be cleaned with a germ-killing solution.  The ureteroscope will be passed through your urethra into your bladder.  A salt-water solution will flow through the ureteroscope to fill your bladder. This will help the health care provider see the openings of your ureters more  clearly.  Then, the ureteroscope will be passed into your ureter. ? If a growth is found, a piece of it may be removed so it can be examined under a microscope (biopsy). ? If a stone is found, it may be removed through the ureteroscope, or the stone may be broken up using a laser, shock waves, or electrical energy. ? In some cases, if the ureter is too small, a tube may be inserted that keeps the ureter open (ureteral stent). The stent may be left in place for 1 or 2 weeks to keep the ureter open, and then the ureteroscopy procedure will be performed.  The scope will be removed, and your bladder will be emptied. The procedure may vary among health care providers and hospitals. What happens after the procedure?  Your blood pressure, heart rate, breathing rate, and blood oxygen level will be monitored until the medicines you were given have worn off.  You may be asked to urinate.  Donot drive for 24 hours if you were given a sedative. This information is not intended to replace advice given to you by your health care provider. Make sure you discuss any questions you have with your health care provider. Document Revised: 02/01/2017 Document Reviewed: 12/02/2015 Elsevier Patient Education  2020 [employer]  

## 2019-04-03 ENCOUNTER — Other Ambulatory Visit (HOSPITAL_COMMUNITY)
Admission: RE | Admit: 2019-04-03 | Discharge: 2019-04-03 | Disposition: A | Discharge status: Home / Self Care | Payer: BC Managed Care – PPO | Source: Other Acute Inpatient Hospital | Attending: Urology | Admitting: Urology

## 2019-04-03 DIAGNOSIS — N201 Calculus of ureter: Secondary | ICD-10-CM | POA: Insufficient documentation

## 2019-04-03 NOTE — Addendum Note (Signed)
Addendum  created 04/03/19 0720 by Dorena Cookey, MD   Charge Capture section accepted

## 2019-04-08 ENCOUNTER — Other Ambulatory Visit: Payer: Self-pay

## 2019-04-09 LAB — CALCULI, WITH PHOTOGRAPH (CLINICAL LAB)
Calcium Oxalate Dihydrate: 20 %
Calcium Oxalate Monohydrate: 80 %
Weight Calculi: 8 mg

## 2019-04-15 ENCOUNTER — Ambulatory Visit: Payer: BC Managed Care – PPO | Admitting: Urology

## 2019-04-30 ENCOUNTER — Other Ambulatory Visit: Payer: Self-pay

## 2019-04-30 ENCOUNTER — Encounter (HOSPITAL_COMMUNITY): Payer: Self-pay | Admitting: *Deleted

## 2019-04-30 ENCOUNTER — Emergency Department (HOSPITAL_COMMUNITY)
Admission: EM | Admit: 2019-04-30 | Discharge: 2019-04-30 | Disposition: A | Discharge status: Home / Self Care | Payer: BC Managed Care – PPO | Attending: Emergency Medicine | Admitting: Emergency Medicine

## 2019-04-30 ENCOUNTER — Emergency Department (HOSPITAL_COMMUNITY): Payer: BC Managed Care – PPO

## 2019-04-30 DIAGNOSIS — G8929 Other chronic pain: Secondary | ICD-10-CM | POA: Insufficient documentation

## 2019-04-30 DIAGNOSIS — I10 Essential (primary) hypertension: Secondary | ICD-10-CM | POA: Insufficient documentation

## 2019-04-30 DIAGNOSIS — M79672 Pain in left foot: Secondary | ICD-10-CM | POA: Insufficient documentation

## 2019-04-30 DIAGNOSIS — Z79899 Other long term (current) drug therapy: Secondary | ICD-10-CM | POA: Insufficient documentation

## 2019-04-30 DIAGNOSIS — Z87891 Personal history of nicotine dependence: Secondary | ICD-10-CM | POA: Insufficient documentation

## 2019-04-30 MED ORDER — OXYCODONE HCL 5 MG PO TABS
10.0000 mg | ORAL_TABLET | Freq: Once | ORAL | Status: AC
  Administered 2019-04-30: 10 mg via ORAL
  Filled 2019-04-30: qty 2

## 2019-04-30 NOTE — ED Triage Notes (Signed)
Pain in left foot, fell 5 months ago and injured foot, 2 days ago he stepped wrong and injured toes. States he wants to be referred to a foot doctor

## 2019-04-30 NOTE — Discharge Instructions (Addendum)
Your x-rays are negative for any acute injuries with this new pain in your foot.  Given the nature of your injury I suspect you may have a mild sprain of your distal foot.  The postop shoe may give you some symptom relief.  I encourage continued elevation and ice packs.  Continue to use your crutches as needed.  Call Dr. Nolen Mu for further evaluation of your new and chronic foot pain.
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
## 2019-05-02 ENCOUNTER — Emergency Department (HOSPITAL_COMMUNITY): Payer: BC Managed Care – PPO

## 2019-05-02 ENCOUNTER — Emergency Department (HOSPITAL_COMMUNITY)
Admission: EM | Admit: 2019-05-02 | Discharge: 2019-05-02 | Disposition: A | Discharge status: Home / Self Care | Payer: BC Managed Care – PPO | Attending: Emergency Medicine | Admitting: Emergency Medicine

## 2019-05-02 ENCOUNTER — Encounter (HOSPITAL_COMMUNITY): Payer: Self-pay

## 2019-05-02 ENCOUNTER — Other Ambulatory Visit: Payer: Self-pay

## 2019-05-02 DIAGNOSIS — W11XXXS Fall on and from ladder, sequela: Secondary | ICD-10-CM | POA: Insufficient documentation

## 2019-05-02 DIAGNOSIS — M545 Low back pain, unspecified: Secondary | ICD-10-CM

## 2019-05-02 DIAGNOSIS — F17220 Nicotine dependence, chewing tobacco, uncomplicated: Secondary | ICD-10-CM | POA: Insufficient documentation

## 2019-05-02 DIAGNOSIS — R2689 Other abnormalities of gait and mobility: Secondary | ICD-10-CM | POA: Insufficient documentation

## 2019-05-02 DIAGNOSIS — G8921 Chronic pain due to trauma: Secondary | ICD-10-CM | POA: Insufficient documentation

## 2019-05-02 HISTORY — DX: Dorsalgia, unspecified: M54.9

## 2019-05-02 MED ORDER — LIDOCAINE 5 % EX PTCH
1.0000 | MEDICATED_PATCH | Freq: Once | CUTANEOUS | Status: DC
  Administered 2019-05-02: 1 via TRANSDERMAL
  Filled 2019-05-02: qty 1

## 2019-05-02 MED ORDER — PREDNISONE 50 MG PO TABS
60.0000 mg | ORAL_TABLET | Freq: Once | ORAL | Status: AC
  Administered 2019-05-02: 16:00:00 60 mg via ORAL
  Filled 2019-05-02: qty 1

## 2019-05-02 MED ORDER — PREDNISONE 10 MG (21) PO TBPK: ORAL_TABLET | Freq: Every day | ORAL | 0 refills | Status: DC

## 2019-05-02 MED ORDER — CYCLOBENZAPRINE HCL 10 MG PO TABS
10.0000 mg | ORAL_TABLET | Freq: Once | ORAL | Status: AC
  Administered 2019-05-02: 10 mg via ORAL
  Filled 2019-05-02: qty 1

## 2019-05-02 MED ORDER — KETOROLAC TROMETHAMINE 30 MG/ML IJ SOLN
30.0000 mg | Freq: Once | INTRAMUSCULAR | Status: AC
  Administered 2019-05-02: 30 mg via INTRAMUSCULAR
  Filled 2019-05-02: qty 1

## 2019-05-02 MED ORDER — OXYCODONE-ACETAMINOPHEN 5-325 MG PO TABS
2.0000 | ORAL_TABLET | Freq: Once | ORAL | Status: AC
  Administered 2019-05-02: 2 via ORAL
  Filled 2019-05-02: qty 2

## 2019-05-02 NOTE — ED Triage Notes (Signed)
Pt reports has chronic lower back pain.  Went to bed last night and unable to get out of bed this morning due to pain>  Pt also says has a left heel fracture from a fall in October.  Pt says he walks with a boot and crutches.

## 2019-05-02 NOTE — Discharge Instructions (Addendum)
You have been seen today for back pain. Please read and follow all provided instructions. Return to the emergency room for worsening condition or new concerning symptoms.    Xrays of your backs and hips were normal.  1. Medications:  Prescription sent to your pharmacy for prednisone steroid taper. Please starting taking  this tomorrow as prescribed. This will help with pain Continue usual home medications Take medications as prescribed. Please review all of the medicines and only take them if you do not have an allergy to them.  -The emergency department cannot refill your pain medication prescription. This MUST be done by your primary care doctor.   2. Treatment: rest, drink plenty of fluids  3. Follow Up:  Please follow up with primary care provider by scheduling an appointment as soon as possible for a visit    It is also a possibility that you have an allergic reaction to any of the medicines that you have been prescribed - Everybody reacts differently to medications and while MOST people have no trouble with most medicines, you may have a reaction such as nausea, vomiting, rash, swelling, shortness of breath. If this is the case, please stop taking the medicine immediately and contact your physician.  ?
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
## 2019-05-06 ENCOUNTER — Emergency Department (HOSPITAL_COMMUNITY)
Admission: EM | Admit: 2019-05-06 | Discharge: 2019-05-06 | Disposition: A | Discharge status: Home / Self Care | Payer: BC Managed Care – PPO | Attending: Emergency Medicine | Admitting: Emergency Medicine

## 2019-05-06 ENCOUNTER — Other Ambulatory Visit: Payer: Self-pay

## 2019-05-06 ENCOUNTER — Encounter (HOSPITAL_COMMUNITY): Payer: Self-pay | Admitting: Emergency Medicine

## 2019-05-06 ENCOUNTER — Emergency Department (HOSPITAL_COMMUNITY): Payer: BC Managed Care – PPO

## 2019-05-06 DIAGNOSIS — Z87891 Personal history of nicotine dependence: Secondary | ICD-10-CM | POA: Insufficient documentation

## 2019-05-06 DIAGNOSIS — R2242 Localized swelling, mass and lump, left lower limb: Secondary | ICD-10-CM | POA: Insufficient documentation

## 2019-05-06 DIAGNOSIS — I10 Essential (primary) hypertension: Secondary | ICD-10-CM | POA: Insufficient documentation

## 2019-05-06 DIAGNOSIS — Z79899 Other long term (current) drug therapy: Secondary | ICD-10-CM | POA: Insufficient documentation

## 2019-05-06 DIAGNOSIS — R600 Localized edema: Secondary | ICD-10-CM

## 2019-05-06 MED ORDER — OXYCODONE HCL 5 MG PO TABS
5.0000 mg | ORAL_TABLET | Freq: Once | ORAL | Status: AC
  Administered 2019-05-06: 10:00:00 5 mg via ORAL
  Filled 2019-05-06: qty 1

## 2019-05-06 MED ORDER — OXYCODONE HCL 5 MG PO TABS: 5.0000 mg | ORAL_TABLET | Freq: Once | ORAL | Status: DC

## 2019-05-06 NOTE — Discharge Instructions (Addendum)
Elevate your foot as much as possible.  Minimal weight bearing.  Call the foot center listed to arrange a follow-up appt.

## 2019-05-06 NOTE — ED Triage Notes (Signed)
Patient complains of left foot swelling last night. States foot is throbbing and nothing makes pain better.
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
## 2019-06-20 ENCOUNTER — Other Ambulatory Visit: Payer: Self-pay

## 2019-06-20 ENCOUNTER — Encounter (HOSPITAL_COMMUNITY): Payer: Self-pay | Admitting: Emergency Medicine

## 2019-06-20 ENCOUNTER — Emergency Department (HOSPITAL_COMMUNITY)
Admission: EM | Admit: 2019-06-20 | Discharge: 2019-06-20 | Disposition: A | Discharge status: Home / Self Care | Payer: Self-pay | Attending: Emergency Medicine | Admitting: Emergency Medicine

## 2019-06-20 DIAGNOSIS — R600 Localized edema: Secondary | ICD-10-CM

## 2019-06-20 DIAGNOSIS — Z87891 Personal history of nicotine dependence: Secondary | ICD-10-CM | POA: Insufficient documentation

## 2019-06-20 DIAGNOSIS — R2242 Localized swelling, mass and lump, left lower limb: Secondary | ICD-10-CM | POA: Insufficient documentation

## 2019-06-20 DIAGNOSIS — Z79899 Other long term (current) drug therapy: Secondary | ICD-10-CM | POA: Insufficient documentation

## 2019-06-20 DIAGNOSIS — I10 Essential (primary) hypertension: Secondary | ICD-10-CM | POA: Insufficient documentation
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
## 2019-06-20 NOTE — Discharge Instructions (Addendum)
Please return to the ED Registration Desk for your ultrasound tomorrow scheduled for 9 AM. Please arrive 15 minutes early.   Call Dr. Donnie Mesa office on Monday to schedule a follow up appointment  Return to the ED for any worsening symptoms including redness/warmth to your foot, fevers > 100.4, chills, chest pain, shortness of breath

## 2019-06-20 NOTE — ED Triage Notes (Signed)
Patient c/o swelling to left foot x2-3 days. Patient wearing ortho boot (dura stepper). Per patient shattered heal of foot approx 7 months ago after falling 8 foot from ladder. Patient denies any new injury. Patient is supposed to have surgery in 2 weeks but maybe delayed due to worker comp issues.

## 2019-06-21 ENCOUNTER — Other Ambulatory Visit (HOSPITAL_COMMUNITY): Payer: Self-pay

## 2019-07-09 ENCOUNTER — Emergency Department (HOSPITAL_COMMUNITY): Payer: Self-pay

## 2019-07-09 ENCOUNTER — Emergency Department (HOSPITAL_COMMUNITY)
Admission: EM | Admit: 2019-07-09 | Discharge: 2019-07-09 | Disposition: A | Discharge status: Home / Self Care | Payer: Self-pay | Attending: Emergency Medicine | Admitting: Emergency Medicine

## 2019-07-09 ENCOUNTER — Other Ambulatory Visit: Payer: Self-pay

## 2019-07-09 ENCOUNTER — Encounter (HOSPITAL_COMMUNITY): Payer: Self-pay | Admitting: Emergency Medicine

## 2019-07-09 DIAGNOSIS — Z87891 Personal history of nicotine dependence: Secondary | ICD-10-CM | POA: Insufficient documentation

## 2019-07-09 DIAGNOSIS — R079 Chest pain, unspecified: Secondary | ICD-10-CM

## 2019-07-09 DIAGNOSIS — R202 Paresthesia of skin: Secondary | ICD-10-CM | POA: Insufficient documentation

## 2019-07-09 DIAGNOSIS — I10 Essential (primary) hypertension: Secondary | ICD-10-CM | POA: Insufficient documentation

## 2019-07-09 DIAGNOSIS — R0789 Other chest pain: Secondary | ICD-10-CM | POA: Insufficient documentation

## 2019-07-09 LAB — BASIC METABOLIC PANEL
Anion gap: 11 (ref 5–15)
BUN: 18 mg/dL (ref 6–20)
CO2: 28 mmol/L (ref 22–32)
Calcium: 8.9 mg/dL (ref 8.9–10.3)
Chloride: 99 mmol/L (ref 98–111)
Creatinine, Ser: 0.89 mg/dL (ref 0.61–1.24)
GFR calc Af Amer: >60 mL/min (ref >60)
GFR calc non Af Amer: >60 mL/min (ref >60)
Glucose, Bld: 110 mg/dL — ABNORMAL HIGH (ref 70–99)
Potassium: 3.3 mmol/L — ABNORMAL LOW (ref 3.5–5.1)
Sodium: 138 mmol/L (ref 135–145)

## 2019-07-09 LAB — TROPONIN I (HIGH SENSITIVITY)
Troponin I (High Sensitivity): 3 ng/L (ref <18)
Troponin I (High Sensitivity): 2 ng/L (ref <18)

## 2019-07-09 LAB — CBC
HCT: 42.7 % (ref 39.0–52.0)
Hemoglobin: 13.8 g/dL (ref 13.0–17.0)
MCH: 29.6 pg (ref 26.0–34.0)
MCHC: 32.3 g/dL (ref 30.0–36.0)
MCV: 91.6 fL (ref 80.0–100.0)
Platelets: 341 10*3/uL (ref 150–400)
RBC: 4.66 MIL/uL (ref 4.22–5.81)
RDW: 13.2 % (ref 11.5–15.5)
WBC: 12.6 10*3/uL — ABNORMAL HIGH (ref 4.0–10.5)
nRBC: 0 % (ref 0.0–0.2)

## 2019-07-09 LAB — D-DIMER, QUANTITATIVE: D-Dimer, Quant: 0.47 ug/mL-FEU (ref 0.00–0.50)

## 2019-07-09 MED ORDER — FENTANYL CITRATE (PF) 100 MCG/2ML IJ SOLN
50.0000 ug | Freq: Once | INTRAMUSCULAR | Status: AC
  Administered 2019-07-09: 13:00:00 50 ug via INTRAVENOUS
  Filled 2019-07-09: qty 2

## 2019-07-09 NOTE — ED Triage Notes (Signed)
Patient c/o chest pain that started last night and is described as pressure and stabbing across his entire chest. Patient is unsure if the pain increases with exertion.
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
## 2019-07-09 NOTE — ED Provider Notes (Signed)
Results for orders placed or performed during the hospital encounter of 07/09/19  Basic metabolic panel  Result Value Ref Range   Sodium 138 135 - 145 mmol/L   Potassium 3.3 (L) 3.5 - 5.1 mmol/L   Chloride 99 98 - 111 mmol/L   CO2 28 22 - 32 mmol/L   Glucose, Bld 110 (H) 70 - 99 mg/dL   BUN 18 6 - 20 mg/dL   Creatinine, Ser 3.54 0.61 - 1.24 mg/dL   Calcium 8.9 8.9 - 56.2 mg/dL   GFR calc non Af Amer >60 >60 mL/min   GFR calc Af Amer >60 >60 mL/min   Anion gap 11 5 - 15  CBC  Result Value Ref Range   WBC 12.6 (H) 4.0 - 10.5 K/uL   RBC 4.66 4.22 - 5.81 MIL/uL   Hemoglobin 13.8 13.0 - 17.0 g/dL   HCT 56.3 89.3 - 73.4 %   MCV 91.6 80.0 - 100.0 fL   MCH 29.6 26.0 - 34.0 pg   MCHC 32.3 30.0 - 36.0 g/dL   RDW 28.7 68.1 - 15.7 %   Platelets 341 150 - 400 K/uL   nRBC 0.0 0.0 - 0.2 %  D-dimer, quantitative (not at [facility])  Result Value Ref Range   D-Dimer, Quant 0.47 0.00 - 0.50 ug/mL-FEU  Troponin I (High Sensitivity)  Result Value Ref Range   Troponin I (High Sensitivity) 3 <18 ng/L  Troponin I (High Sensitivity)  Result Value Ref Range   Troponin I (High Sensitivity) 2 <18 ng/L     Patient turned over from the daytime emergency physician Dr. Orland Dec.  I was awaiting his second troponin.  No significant change in the troponins both normal.  Patient stable for discharge and follow-up with primary care provider.  D-dimer was not elevated.   Vanetta Mulders, MD 07/09/19 [phone]

## 2019-07-09 NOTE — Discharge Instructions (Addendum)
Follow-up with cardiology.  Today's work-up for the chest pain without any acute findings.  Also follow-up with your regular doctor.  Return for any new or worse symptoms.

## 2019-09-22 ENCOUNTER — Emergency Department (HOSPITAL_COMMUNITY): Payer: Self-pay

## 2019-09-22 ENCOUNTER — Emergency Department (HOSPITAL_COMMUNITY)
Admission: EM | Admit: 2019-09-22 | Discharge: 2019-09-22 | Disposition: A | Discharge status: Home / Self Care | Payer: Self-pay | Attending: Emergency Medicine | Admitting: Emergency Medicine

## 2019-09-22 ENCOUNTER — Encounter (HOSPITAL_COMMUNITY): Payer: Self-pay | Admitting: *Deleted

## 2019-09-22 ENCOUNTER — Other Ambulatory Visit: Payer: Self-pay

## 2019-09-22 DIAGNOSIS — R5382 Chronic fatigue, unspecified: Secondary | ICD-10-CM | POA: Insufficient documentation

## 2019-09-22 DIAGNOSIS — I10 Essential (primary) hypertension: Secondary | ICD-10-CM | POA: Insufficient documentation

## 2019-09-22 DIAGNOSIS — F17228 Nicotine dependence, chewing tobacco, with other nicotine-induced disorders: Secondary | ICD-10-CM | POA: Insufficient documentation

## 2019-09-22 DIAGNOSIS — M5410 Radiculopathy, site unspecified: Secondary | ICD-10-CM | POA: Insufficient documentation

## 2019-09-22 DIAGNOSIS — R200 Anesthesia of skin: Secondary | ICD-10-CM | POA: Insufficient documentation

## 2019-09-22 LAB — CBC WITH DIFFERENTIAL/PLATELET
Abs Immature Granulocytes: 0.02 10*3/uL (ref 0.00–0.07)
Basophils Absolute: 0 10*3/uL (ref 0.0–0.1)
Basophils Relative: 0 %
Eosinophils Absolute: 0.2 10*3/uL (ref 0.0–0.5)
Eosinophils Relative: 3 %
HCT: 39.7 % (ref 39.0–52.0)
Hemoglobin: 12.8 g/dL — ABNORMAL LOW (ref 13.0–17.0)
Immature Granulocytes: 0 %
Lymphocytes Relative: 25 %
Lymphs Abs: 1.8 10*3/uL (ref 0.7–4.0)
MCH: 29.4 pg (ref 26.0–34.0)
MCHC: 32.2 g/dL (ref 30.0–36.0)
MCV: 91.1 fL (ref 80.0–100.0)
Monocytes Absolute: 0.8 10*3/uL (ref 0.1–1.0)
Monocytes Relative: 10 %
Neutro Abs: 4.4 10*3/uL (ref 1.7–7.7)
Neutrophils Relative %: 62 %
Platelets: 271 10*3/uL (ref 150–400)
RBC: 4.36 MIL/uL (ref 4.22–5.81)
RDW: 12.7 % (ref 11.5–15.5)
WBC: 7.2 10*3/uL (ref 4.0–10.5)
nRBC: 0 % (ref 0.0–0.2)

## 2019-09-22 LAB — COMPREHENSIVE METABOLIC PANEL
ALT: 31 U/L (ref 0–44)
AST: 37 U/L (ref 15–41)
Albumin: 4.1 g/dL (ref 3.5–5.0)
Alkaline Phosphatase: 62 U/L (ref 38–126)
Anion gap: 11 (ref 5–15)
BUN: 11 mg/dL (ref 6–20)
CO2: 25 mmol/L (ref 22–32)
Calcium: 8.8 mg/dL — ABNORMAL LOW (ref 8.9–10.3)
Chloride: 103 mmol/L (ref 98–111)
Creatinine, Ser: 0.82 mg/dL (ref 0.61–1.24)
GFR calc Af Amer: >60 mL/min (ref >60)
GFR calc non Af Amer: >60 mL/min (ref >60)
Glucose, Bld: 75 mg/dL (ref 70–99)
Potassium: 4 mmol/L (ref 3.5–5.1)
Sodium: 139 mmol/L (ref 135–145)
Total Bilirubin: 0.9 mg/dL (ref 0.3–1.2)
Total Protein: 7.8 g/dL (ref 6.5–8.1)

## 2019-09-22 LAB — TROPONIN I (HIGH SENSITIVITY)
Troponin I (High Sensitivity): 2 ng/L (ref <18)
Troponin I (High Sensitivity): <2 ng/L (ref <18)

## 2019-09-22 MED ORDER — PREDNISONE 10 MG (21) PO TBPK
ORAL_TABLET | ORAL | 0 refills | Status: DC
Start: 2019-09-22 — End: 2019-12-15
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
## 2019-09-22 NOTE — ED Notes (Signed)
Asked pt. To provide a urine sample for us. 

## 2019-09-22 NOTE — ED Triage Notes (Signed)
C/o fatigue, dizziness, left arm numbness for a week.

## 2019-09-22 NOTE — Discharge Instructions (Addendum)
You were seen today for arm numbness, this might be due to a pinched nerve in your cervical spine.  I want you to follow-up with neurosurgery about this, their information is attached.  Use the attached instructions.  If you have any chest pain or any new or worsening concerning symptoms like back to the emergency department.  Follow-up with your primary care about your ongoing fatigue.

## 2019-12-13 ENCOUNTER — Other Ambulatory Visit: Payer: Self-pay

## 2019-12-13 ENCOUNTER — Encounter (HOSPITAL_COMMUNITY): Payer: Self-pay | Admitting: Emergency Medicine

## 2019-12-13 ENCOUNTER — Emergency Department (HOSPITAL_COMMUNITY): Payer: Self-pay

## 2019-12-13 ENCOUNTER — Emergency Department (HOSPITAL_COMMUNITY)
Admission: EM | Admit: 2019-12-13 | Discharge: 2019-12-13 | Disposition: A | Discharge status: Home / Self Care | Payer: Self-pay | Attending: Emergency Medicine | Admitting: Emergency Medicine

## 2019-12-13 DIAGNOSIS — Z20822 Contact with and (suspected) exposure to covid-19: Secondary | ICD-10-CM | POA: Insufficient documentation

## 2019-12-13 DIAGNOSIS — J029 Acute pharyngitis, unspecified: Secondary | ICD-10-CM

## 2019-12-13 DIAGNOSIS — I10 Essential (primary) hypertension: Secondary | ICD-10-CM | POA: Insufficient documentation

## 2019-12-13 DIAGNOSIS — Z87891 Personal history of nicotine dependence: Secondary | ICD-10-CM | POA: Insufficient documentation

## 2019-12-13 LAB — GROUP A STREP BY PCR: Group A Strep by PCR: NOT DETECTED

## 2019-12-13 LAB — CBC
HCT: 44.3 % (ref 39.0–52.0)
Hemoglobin: 14.4 g/dL (ref 13.0–17.0)
MCH: 28.6 pg (ref 26.0–34.0)
MCHC: 32.5 g/dL (ref 30.0–36.0)
MCV: 87.9 fL (ref 80.0–100.0)
Platelets: 348 10*3/uL (ref 150–400)
RBC: 5.04 MIL/uL (ref 4.22–5.81)
RDW: 13.2 % (ref 11.5–15.5)
WBC: 10.5 10*3/uL (ref 4.0–10.5)
nRBC: 0 % (ref 0.0–0.2)

## 2019-12-13 LAB — RESPIRATORY PANEL BY RT PCR (FLU A&B, COVID)
Influenza A by PCR: NEGATIVE
Influenza B by PCR: NEGATIVE
SARS Coronavirus 2 by RT PCR: NEGATIVE

## 2019-12-13 LAB — BASIC METABOLIC PANEL
Anion gap: 12 (ref 5–15)
BUN: 10 mg/dL (ref 6–20)
CO2: 27 mmol/L (ref 22–32)
Calcium: 9.7 mg/dL (ref 8.9–10.3)
Chloride: 95 mmol/L — ABNORMAL LOW (ref 98–111)
Creatinine, Ser: 0.99 mg/dL (ref 0.61–1.24)
GFR, Estimated: >60 mL/min (ref >60)
Glucose, Bld: 107 mg/dL — ABNORMAL HIGH (ref 70–99)
Potassium: 3.7 mmol/L (ref 3.5–5.1)
Sodium: 134 mmol/L — ABNORMAL LOW (ref 135–145)

## 2019-12-13 MED ORDER — AMLODIPINE BESYLATE 5 MG PO TABS
5.0000 mg | ORAL_TABLET | Freq: Every day | ORAL | 0 refills | Status: DC
Start: 2019-12-13 — End: 2021-07-29

## 2019-12-13 MED ORDER — AMLODIPINE BESYLATE 5 MG PO TABS
5.0000 mg | ORAL_TABLET | Freq: Once | ORAL | Status: AC
  Administered 2019-12-13: 5 mg via ORAL
  Filled 2019-12-13: qty 1

## 2019-12-13 NOTE — ED Triage Notes (Signed)
Pt c/o of sore throat x3days.
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
## 2019-12-13 NOTE — Discharge Instructions (Addendum)
The testing today did not show any serious problems. There is no sign of strep throat, Covid infection, pneumonia or other serious problems. Your blood pressure was elevated so we will restart your Norvasc. Make sure you follow-up with your doctor in a week or so for blood pressure check. Try to stay on a low-salt diet to help improve your blood pressure.

## 2019-12-14 ENCOUNTER — Encounter (HOSPITAL_COMMUNITY): Payer: Self-pay | Admitting: Emergency Medicine

## 2019-12-14 ENCOUNTER — Other Ambulatory Visit: Payer: Self-pay

## 2019-12-14 ENCOUNTER — Observation Stay (HOSPITAL_COMMUNITY)
Admission: EM | Admit: 2019-12-14 | Discharge: 2019-12-15 | Disposition: A | Discharge status: Home / Self Care | Payer: Self-pay | Attending: Internal Medicine | Admitting: Internal Medicine

## 2019-12-14 ENCOUNTER — Emergency Department (HOSPITAL_COMMUNITY): Payer: Self-pay

## 2019-12-14 DIAGNOSIS — Z87891 Personal history of nicotine dependence: Secondary | ICD-10-CM | POA: Insufficient documentation

## 2019-12-14 DIAGNOSIS — F1910 Other psychoactive substance abuse, uncomplicated: Secondary | ICD-10-CM

## 2019-12-14 DIAGNOSIS — Z720 Tobacco use: Secondary | ICD-10-CM

## 2019-12-14 DIAGNOSIS — D75839 Thrombocytosis, unspecified: Secondary | ICD-10-CM

## 2019-12-14 DIAGNOSIS — Z20822 Contact with and (suspected) exposure to covid-19: Secondary | ICD-10-CM | POA: Insufficient documentation

## 2019-12-14 DIAGNOSIS — D72829 Elevated white blood cell count, unspecified: Secondary | ICD-10-CM | POA: Diagnosis present

## 2019-12-14 DIAGNOSIS — Z79899 Other long term (current) drug therapy: Secondary | ICD-10-CM | POA: Insufficient documentation

## 2019-12-14 DIAGNOSIS — N179 Acute kidney failure, unspecified: Principal | ICD-10-CM | POA: Diagnosis present

## 2019-12-14 DIAGNOSIS — E860 Dehydration: Secondary | ICD-10-CM

## 2019-12-14 DIAGNOSIS — I10 Essential (primary) hypertension: Secondary | ICD-10-CM | POA: Diagnosis present

## 2019-12-14 DIAGNOSIS — R109 Unspecified abdominal pain: Secondary | ICD-10-CM | POA: Insufficient documentation

## 2019-12-14 DIAGNOSIS — R55 Syncope and collapse: Secondary | ICD-10-CM | POA: Insufficient documentation

## 2019-12-14 DIAGNOSIS — R7401 Elevation of levels of liver transaminase levels: Secondary | ICD-10-CM

## 2019-12-14 LAB — CBC WITH DIFFERENTIAL/PLATELET
Abs Immature Granulocytes: 0.11 10*3/uL — ABNORMAL HIGH (ref 0.00–0.07)
Basophils Absolute: 0.1 10*3/uL (ref 0.0–0.1)
Basophils Relative: 0 %
Eosinophils Absolute: 0.1 10*3/uL (ref 0.0–0.5)
Eosinophils Relative: 1 %
HCT: 50.9 % (ref 39.0–52.0)
Hemoglobin: 16.6 g/dL (ref 13.0–17.0)
Immature Granulocytes: 1 %
Lymphocytes Relative: 17 %
Lymphs Abs: 2.2 10*3/uL (ref 0.7–4.0)
MCH: 29.1 pg (ref 26.0–34.0)
MCHC: 32.6 g/dL (ref 30.0–36.0)
MCV: 89.1 fL (ref 80.0–100.0)
Monocytes Absolute: 1.1 10*3/uL — ABNORMAL HIGH (ref 0.1–1.0)
Monocytes Relative: 9 %
Neutro Abs: 9.2 10*3/uL — ABNORMAL HIGH (ref 1.7–7.7)
Neutrophils Relative %: 72 %
Platelets: 409 10*3/uL — ABNORMAL HIGH (ref 150–400)
RBC: 5.71 MIL/uL (ref 4.22–5.81)
RDW: 13.4 % (ref 11.5–15.5)
WBC: 12.8 10*3/uL — ABNORMAL HIGH (ref 4.0–10.5)
nRBC: 0 % (ref 0.0–0.2)

## 2019-12-14 LAB — COMPREHENSIVE METABOLIC PANEL
ALT: 31 U/L (ref 0–44)
AST: 42 U/L — ABNORMAL HIGH (ref 15–41)
Albumin: 5.2 g/dL — ABNORMAL HIGH (ref 3.5–5.0)
Alkaline Phosphatase: 80 U/L (ref 38–126)
Anion gap: 17 — ABNORMAL HIGH (ref 5–15)
BUN: 22 mg/dL — ABNORMAL HIGH (ref 6–20)
CO2: 25 mmol/L (ref 22–32)
Calcium: 10.5 mg/dL — ABNORMAL HIGH (ref 8.9–10.3)
Chloride: 96 mmol/L — ABNORMAL LOW (ref 98–111)
Creatinine, Ser: 1.69 mg/dL — ABNORMAL HIGH (ref 0.61–1.24)
GFR, Estimated: 46 mL/min — ABNORMAL LOW (ref >60)
Glucose, Bld: 121 mg/dL — ABNORMAL HIGH (ref 70–99)
Potassium: 4.5 mmol/L (ref 3.5–5.1)
Sodium: 138 mmol/L (ref 135–145)
Total Bilirubin: 1.6 mg/dL — ABNORMAL HIGH (ref 0.3–1.2)
Total Protein: 10.5 g/dL — ABNORMAL HIGH (ref 6.5–8.1)

## 2019-12-14 LAB — URINALYSIS, ROUTINE W REFLEX MICROSCOPIC
Bacteria, UA: NONE SEEN
Bilirubin Urine: NEGATIVE
Glucose, UA: NEGATIVE mg/dL
Ketones, ur: NEGATIVE mg/dL
Leukocytes,Ua: NEGATIVE
Nitrite: NEGATIVE
Protein, ur: 100 mg/dL — AB
Specific Gravity, Urine: 1.028 (ref 1.005–1.030)
pH: 5 (ref 5.0–8.0)

## 2019-12-14 LAB — RAPID URINE DRUG SCREEN, HOSP PERFORMED
Amphetamines: POSITIVE — AB
Barbiturates: NOT DETECTED
Benzodiazepines: POSITIVE — AB
Cocaine: NOT DETECTED
Opiates: POSITIVE — AB
Tetrahydrocannabinol: NOT DETECTED

## 2019-12-14 LAB — RESPIRATORY PANEL BY RT PCR (FLU A&B, COVID)
Influenza A by PCR: NEGATIVE
Influenza B by PCR: NEGATIVE
SARS Coronavirus 2 by RT PCR: NEGATIVE

## 2019-12-14 LAB — TROPONIN I (HIGH SENSITIVITY)
Troponin I (High Sensitivity): 36 ng/L — ABNORMAL HIGH (ref <18)
Troponin I (High Sensitivity): 23 ng/L — ABNORMAL HIGH (ref <18)

## 2019-12-14 LAB — LACTIC ACID, PLASMA
Lactic Acid, Venous: 1.8 mmol/L (ref 0.5–1.9)
Lactic Acid, Venous: 1.6 mmol/L (ref 0.5–1.9)

## 2019-12-14 LAB — ETHANOL: Alcohol, Ethyl (B): <10 mg/dL (ref <10)

## 2019-12-14 LAB — CBG MONITORING, ED: Glucose-Capillary: 162 mg/dL — ABNORMAL HIGH (ref 70–99)

## 2019-12-14 MED ORDER — AMLODIPINE BESYLATE 5 MG PO TABS
5.0000 mg | ORAL_TABLET | Freq: Every day | ORAL | Status: DC
  Administered 2019-12-14 – 2019-12-15 (×2): 5 mg via ORAL
  Filled 2019-12-14 (×2): qty 1

## 2019-12-14 MED ORDER — SODIUM CHLORIDE 0.9 % IV SOLN: Freq: Once | INTRAVENOUS | Status: AC

## 2019-12-14 MED ORDER — HEPARIN SODIUM (PORCINE) 5000 UNIT/ML IJ SOLN
5000.0000 [IU] | Freq: Three times a day (TID) | INTRAMUSCULAR | Status: DC
  Administered 2019-12-14: 5000 [IU] via SUBCUTANEOUS

## 2019-12-14 MED ORDER — SODIUM CHLORIDE 0.9 % IV BOLUS
1000.0000 mL | Freq: Once | INTRAVENOUS | Status: AC
  Administered 2019-12-14: 1000 mL via INTRAVENOUS

## 2019-12-14 NOTE — ED Triage Notes (Signed)
Pt arrived [facility]. Pt c/o weakness. Pt diaphoretic on arrival. Pt afebrile.
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
## 2019-12-15 DIAGNOSIS — Z720 Tobacco use: Secondary | ICD-10-CM

## 2019-12-15 DIAGNOSIS — R7401 Elevation of levels of liver transaminase levels: Secondary | ICD-10-CM

## 2019-12-15 DIAGNOSIS — D75839 Thrombocytosis, unspecified: Secondary | ICD-10-CM

## 2019-12-15 DIAGNOSIS — F1910 Other psychoactive substance abuse, uncomplicated: Secondary | ICD-10-CM

## 2019-12-15 DIAGNOSIS — E860 Dehydration: Secondary | ICD-10-CM

## 2019-12-15 DIAGNOSIS — N179 Acute kidney failure, unspecified: Secondary | ICD-10-CM

## 2019-12-15 LAB — PROTIME-INR
INR: 1.1 (ref 0.8–1.2)
Prothrombin Time: 13.6 seconds (ref 11.4–15.2)

## 2019-12-15 LAB — COMPREHENSIVE METABOLIC PANEL
ALT: 22 U/L (ref 0–44)
AST: 27 U/L (ref 15–41)
Albumin: 3.6 g/dL (ref 3.5–5.0)
Alkaline Phosphatase: 50 U/L (ref 38–126)
Anion gap: 10 (ref 5–15)
BUN: 22 mg/dL — ABNORMAL HIGH (ref 6–20)
CO2: 23 mmol/L (ref 22–32)
Calcium: 8.6 mg/dL — ABNORMAL LOW (ref 8.9–10.3)
Chloride: 105 mmol/L (ref 98–111)
Creatinine, Ser: 0.89 mg/dL (ref 0.61–1.24)
GFR, Estimated: >60 mL/min (ref >60)
Glucose, Bld: 113 mg/dL — ABNORMAL HIGH (ref 70–99)
Potassium: 3.8 mmol/L (ref 3.5–5.1)
Sodium: 138 mmol/L (ref 135–145)
Total Bilirubin: 1.1 mg/dL (ref 0.3–1.2)
Total Protein: 7.3 g/dL (ref 6.5–8.1)

## 2019-12-15 LAB — CBC
HCT: 41 % (ref 39.0–52.0)
Hemoglobin: 13.4 g/dL (ref 13.0–17.0)
MCH: 28.7 pg (ref 26.0–34.0)
MCHC: 32.7 g/dL (ref 30.0–36.0)
MCV: 87.8 fL (ref 80.0–100.0)
Platelets: 316 10*3/uL (ref 150–400)
RBC: 4.67 MIL/uL (ref 4.22–5.81)
RDW: 13.2 % (ref 11.5–15.5)
WBC: 9 10*3/uL (ref 4.0–10.5)
nRBC: 0 % (ref 0.0–0.2)

## 2019-12-15 LAB — MAGNESIUM: Magnesium: 2 mg/dL (ref 1.7–2.4)

## 2019-12-15 LAB — GLUCOSE, CAPILLARY
Glucose-Capillary: 110 mg/dL — ABNORMAL HIGH (ref 70–99)
Glucose-Capillary: 101 mg/dL — ABNORMAL HIGH (ref 70–99)

## 2019-12-15 LAB — PHOSPHORUS: Phosphorus: 3 mg/dL (ref 2.5–4.6)

## 2019-12-15 LAB — HIV ANTIBODY (ROUTINE TESTING W REFLEX): HIV Screen 4th Generation wRfx: NONREACTIVE

## 2019-12-15 LAB — APTT: aPTT: 31 seconds (ref 24–36)

## 2019-12-15 MED ORDER — ENSURE ENLIVE PO LIQD: 237.0000 mL | Freq: Two times a day (BID) | ORAL | Status: DC

## 2019-12-15 MED ORDER — SODIUM CHLORIDE 0.9 % IV SOLN: INTRAVENOUS | Status: DC

## 2019-12-15 MED ORDER — ONDANSETRON HCL 4 MG/2ML IJ SOLN
4.0000 mg | Freq: Once | INTRAMUSCULAR | Status: DC
  Filled 2019-12-15: qty 2
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
## 2019-12-15 NOTE — Discharge Summary (Signed)
Physician Discharge Summary  [facility] Benjamin Vaughn. [name] DOB: 08-22-1970 DOA: 12/14/2019  PCP: Benjamin Gaskins, MD  Admit date: 12/14/2019  Discharge date: 12/15/2019  Admitted From:Home  Disposition:  Home  Recommendations for Outpatient Follow-up:  1. Follow up with PCP in 1-2 weeks 2. Please obtain BMP/CBC in one week 3. Continue on home medications as prior 4. Avoid illicit drugs which was discussed with patient with substance abuse counseling information provided  Home Health: None  Equipment/Devices: None  Discharge Condition: Stable  CODE STATUS: Full  Diet recommendation: Heart Healthy  Brief/Interim Summary: Per HPI: Benjamin Vaughn Benjamin Vaughn. is a 49 y.o. male with medical history significant for hypertension, rib fracture,obstructing ureterolithiasis s/p ureteroscopy/stone extraction (January 2021) who presents to the emergency department with a fall via EMS with complaint of 2-day onset of worsening weakness.  Patient was unable to provide history at bedside, history was obtained from ED PA and ED medical record.  Per report, patient states that he was at his friend's house earlier today "watching games" and was "brushing his teeth" when he suddenly started to have excessive sweating and lightheadedness, he states that he sat on the bathroom floor and his friend activated EMS.  On arrival of EMS, patient was noted to be hypotensive with SBP in the 70s and with tachycardia.  On arrival to the ED, it was reported that patient was confused and kept saying " I don't  want to go to jail".  He complained of chest pain and abdominal pain at that time, but he denied fever nausea vomiting or diarrhea.  10/12: Patient was admitted with acute kidney injury in the setting of dehydration and polysubstance abuse where he was noted to be positive for opiates, benzodiazepines, and amphetamines.  He denies any drug use, but states that he has not been eating and drinking very well  overall.  His creatinine on admission was 1.69 and his baseline is near 0.8 and he is currently near that baseline this morning.  He denies any other complaints or concerns and does not give very much history and states that he is ready to go home.  No other acute events noted throughout this brief admission and he appears ready for discharge.  He will need close follow-up as noted above and TOC has consulted with this patient to ensure that all needs are met prior to discharge.  Discharge Diagnoses:  Principal Problem:   AKI (acute kidney injury) ([place]) Active Problems:   Leukocytosis   Essential hypertension, benign   Hypercalcemia   Thrombocytosis   Dehydration   Transaminitis   Polysubstance abuse ([place])   Tobacco abuse  Principal discharge diagnosis: Prerenal AKI secondary to dehydration.  Discharge Instructions  Discharge Instructions    Diet - low sodium heart healthy   Complete by: As directed    Increase activity slowly   Complete by: As directed      Allergies as of 12/15/2019      Reactions   Aspirin Other (See Comments)   Bad for kidneys    Bactrim Hives, Itching   Hydrocodone-acetaminophen Itching   Nsaids Other (See Comments)   Bad for kidneys   Sulfamethoxazole Itching   Tylenol [acetaminophen] Other (See Comments)   Bad for kidneys    Vicodin [hydrocodone-acetaminophen] Itching   Sulfamethoxazole-trimethoprim Itching      Medication List    STOP taking these medications   gabapentin 300 MG capsule Commonly known as: NEURONTIN   predniSONE 10 MG (21) Tbpk tablet Commonly  known as: STERAPRED UNI-PAK 21 TAB     TAKE these medications   amLODipine 5 MG tablet Commonly known as: NORVASC Take 1 tablet (5 mg total) by mouth daily.   diphenhydrAMINE 25 MG tablet Commonly known as: BENADRYL Take 25 mg by mouth every 6 (six) hours as needed.   multivitamin with minerals Tabs tablet Take 1 tablet by mouth daily.   ondansetron 4 MG disintegrating  tablet Commonly known as: Zofran ODT Take 1 tablet (4 mg total) by mouth every 8 (eight) hours as needed for nausea.   oxyCODONE 15 MG immediate release tablet Commonly known as: Roxicodone Take 1 tablet (15 mg total) by mouth every 4 (four) hours as needed for moderate pain or severe pain. What changed: Another medication with the same name was removed. Continue taking this medication, and follow the directions you see here.   ProAir HFA 108 (90 Base) MCG/ACT inhaler Generic drug: albuterol Inhale 1-2 puffs into the lungs every 6 (six) hours as needed for wheezing or shortness of breath.       Follow-up Information    Benjamin Gaskins, MD Follow up in 1 week(s).   Specialty: Internal Medicine Contact information: [place] [address] [phone]              Allergies  Allergen Reactions  . Aspirin Other (See Comments)    Bad for kidneys   . Bactrim Hives and Itching  . Hydrocodone-Acetaminophen Itching  . Nsaids Other (See Comments)    Bad for kidneys  . Sulfamethoxazole Itching  . Tylenol [Acetaminophen] Other (See Comments)    Bad for kidneys   . Vicodin [Hydrocodone-Acetaminophen] Itching  . Sulfamethoxazole-Trimethoprim Itching    Consultations:  None   Procedures/Studies: DG Chest 1 View  Result Date: 12/13/2019 CLINICAL DATA:  Sore throat and shortness of breath. EXAM: CHEST  1 VIEW COMPARISON:  Chest radiograph 07/09/2019. FINDINGS: The heart size and mediastinal contours are within normal limits. Both lungs are clear. The visualized skeletal structures are unremarkable. IMPRESSION: No active disease. Electronically Signed   By: Lovey Newcomer M.D.   On: 12/13/2019 13:58   CT Renal Stone Study  Result Date: 12/14/2019 CLINICAL DATA:  Flank pain, weakness, lower abdominal pain EXAM: CT ABDOMEN AND PELVIS WITHOUT CONTRAST TECHNIQUE: Multidetector CT imaging of the abdomen and pelvis was performed following the standard protocol  without IV contrast. COMPARISON:  04/02/2019 FINDINGS: Lower chest: No acute abnormality. Hepatobiliary: No solid liver abnormality is seen. No gallstones, gallbladder wall thickening, or biliary dilatation. Pancreas: Unremarkable. No pancreatic ductal dilatation or surrounding inflammatory changes. Spleen: Normal in size without significant abnormality. Adrenals/Urinary Tract: Adrenal glands are unremarkable. Kidneys are normal, without renal calculi, solid lesion, or hydronephrosis. Bladder is unremarkable. Stomach/Bowel: Stomach is within normal limits. Appendix appears normal. No evidence of bowel wall thickening, distention, or inflammatory changes. Vascular/Lymphatic: Scattered aortic atherosclerosis. No enlarged abdominal or pelvic lymph nodes. Reproductive: No mass or other significant abnormality. Other: No abdominal wall hernia or abnormality. No abdominopelvic ascites. Musculoskeletal: No acute or significant osseous findings. IMPRESSION: 1. No non-contrast CT findings of the abdomen or pelvis to explain pain. No evidence of urinary tract calculus or hydronephrosis. 2. Aortic Atherosclerosis (ICD10-I70.0). Electronically Signed   By: Eddie Candle M.D.   On: 12/14/2019 20:33     Discharge Exam: Vitals:   12/15/19 0345 12/15/19 0900  BP: 128/81 (!) 144/92  Pulse: 88 87  Resp: 20 (!) 24  Temp: 97.9 F (36.6 C) 98.1 F (  36.7 C)  SpO2: 98% 98%   Vitals:   12/14/19 2300 12/15/19 0000 12/15/19 0345 12/15/19 0900  BP: 136/89 132/86 128/81 (!) 144/92  Pulse: 83 96 88 87  Resp: (!) _0 (!) 24  Temp:   97.9 F (36.6 C) 98.1 F (36.7 C)  TempSrc:    Oral  SpO2: 100% 100% 98% 98%  Weight:   61.5 kg   Height:   _1  (1.803 m)     General: Pt is alert, awake, not in acute distress Cardiovascular: RRR, S1/S2 +, no rubs, no gallops Respiratory: CTA bilaterally, no wheezing, no rhonchi Abdominal: Soft, NT, ND, bowel sounds + Extremities: no edema, no cyanosis    The results of  significant diagnostics from this hospitalization (including imaging, microbiology, ancillary and laboratory) are listed below for reference.     Microbiology: Recent Results (from the past 240 hour(s))  Respiratory Panel by RT PCR (Flu A&B, Covid) - Nasopharyngeal Swab     Status: None   Collection Time: 12/13/19 12:47 PM   Specimen: Nasopharyngeal Swab  Result Value Ref Range Status   SARS Coronavirus 2 by RT PCR NEGATIVE NEGATIVE Final    Comment: (NOTE) SARS-CoV-2 target nucleic acids are NOT DETECTED.  The SARS-CoV-2 RNA is generally detectable in upper respiratoy specimens during the acute phase of infection. The lowest concentration of SARS-CoV-2 viral copies this assay can detect is 131 copies/mL. A negative result does not preclude SARS-Cov-2 infection and should not be used as the sole basis for treatment or other patient management decisions. A negative result may occur with  improper specimen collection/handling, submission of specimen other than nasopharyngeal swab, presence of viral mutation(s) within the areas targeted by this assay, and inadequate number of viral copies (<131 copies/mL). A negative result must be combined with clinical observations, patient history, and epidemiological information. The expected result is Negative.  Fact Sheet for Patients:  PinkCheek.be  Fact Sheet for Healthcare Providers:  GravelBags.it  This test is no t yet approved or cleared by the [place] FDA and  has been authorized for detection and/or diagnosis of SARS-CoV-2 by FDA under an Emergency Use Authorization (EUA). This EUA will remain  in effect (meaning this test can be used) for the duration of the COVID-19 declaration under Section 564(b)(1) of the Act, 21 U.S.C. section 360bbb-3(b)(1), unless the authorization is terminated or revoked sooner.     Influenza A by PCR NEGATIVE NEGATIVE Final   Influenza B by  PCR NEGATIVE NEGATIVE Final    Comment: (NOTE) The Xpert Xpress SARS-CoV-2/FLU/RSV assay is intended as an aid in  the diagnosis of influenza from Nasopharyngeal swab specimens and  should not be used as a sole basis for treatment. Nasal washings and  aspirates are unacceptable for Xpert Xpress SARS-CoV-2/FLU/RSV  testing.  Fact Sheet for Patients: PinkCheek.be  Fact Sheet for Healthcare Providers: GravelBags.it  This test is not yet approved or cleared by the [place] FDA and  has been authorized for detection and/or diagnosis of SARS-CoV-2 by  FDA under an Emergency Use Authorization (EUA). This EUA will remain  in effect (meaning this test can be used) for the duration of the  Covid-19 declaration under Section 564(b)(1) of the Act, 21  U.S.C. section 360bbb-3(b)(1), unless the authorization is  terminated or revoked. Performed at [facility], [address] [place], [place] [address]   Group A Strep by PCR     Status: None   Collection Time: 12/13/19 12:47 PM  Specimen: Nasopharyngeal Swab; Sterile Swab  Result Value Ref Range Status   Group A Strep by PCR NOT DETECTED NOT DETECTED Final    Comment: Performed at [facility], [address], [place], [place] [address]  Respiratory Panel by RT PCR (Flu A&B, Covid) - Nasopharyngeal Swab     Status: None   Collection Time: 12/14/19  9:26 PM   Specimen: Nasopharyngeal Swab  Result Value Ref Range Status   SARS Coronavirus 2 by RT PCR NEGATIVE NEGATIVE Final    Comment: (NOTE) SARS-CoV-2 target nucleic acids are NOT DETECTED.  The SARS-CoV-2 RNA is generally detectable in upper respiratoy specimens during the acute phase of infection. The lowest concentration of SARS-CoV-2 viral copies this assay can detect is 131 copies/mL. A negative result does not preclude SARS-Cov-2 infection and should not be used as the sole basis for treatment or other patient management  decisions. A negative result may occur with  improper specimen collection/handling, submission of specimen other than nasopharyngeal swab, presence of viral mutation(s) within the areas targeted by this assay, and inadequate number of viral copies (<131 copies/mL). A negative result must be combined with clinical observations, patient history, and epidemiological information. The expected result is Negative.  Fact Sheet for Patients:  PinkCheek.be  Fact Sheet for Healthcare Providers:  GravelBags.it  This test is no t yet approved or cleared by the [place] FDA and  has been authorized for detection and/or diagnosis of SARS-CoV-2 by FDA under an Emergency Use Authorization (EUA). This EUA will remain  in effect (meaning this test can be used) for the duration of the COVID-19 declaration under Section 564(b)(1) of the Act, 21 U.S.C. section 360bbb-3(b)(1), unless the authorization is terminated or revoked sooner.     Influenza A by PCR NEGATIVE NEGATIVE Final   Influenza B by PCR NEGATIVE NEGATIVE Final    Comment: (NOTE) The Xpert Xpress SARS-CoV-2/FLU/RSV assay is intended as an aid in  the diagnosis of influenza from Nasopharyngeal swab specimens and  should not be used as a sole basis for treatment. Nasal washings and  aspirates are unacceptable for Xpert Xpress SARS-CoV-2/FLU/RSV  testing.  Fact Sheet for Patients: PinkCheek.be  Fact Sheet for Healthcare Providers: GravelBags.it  This test is not yet approved or cleared by the [place] FDA and  has been authorized for detection and/or diagnosis of SARS-CoV-2 by  FDA under an Emergency Use Authorization (EUA). This EUA will remain  in effect (meaning this test can be used) for the duration of the  Covid-19 declaration under Section 564(b)(1) of the Act, 21  U.S.C. section 360bbb-3(b)(1), unless the  authorization is  terminated or revoked. Performed at [facility], [address], [place], [place] [address]      Labs: BNP (last 3 results) No results for input(s): BNP in the last 8760 hours. Basic Metabolic Panel: Recent Labs  Lab 12/13/19 1310 12/14/19 1827 12/15/19 0623  NA 134* 138 138  K 3.7 4.5 3.8  CL 95* 96* 105  CO2 _0 GLUCOSE 107* 121* 113*  BUN 10 22* 22*  CREATININE 0.99 1.69* 0.89  CALCIUM 9.7 10.5* 8.6*  MG  --   --  2.0  PHOS  --   --  3.0   Liver Function Tests: Recent Labs  Lab 12/14/19 1827 12/15/19 0623  AST 42* 27  ALT 31 22  ALKPHOS 80 50  BILITOT 1.6* 1.1  PROT 10.5* 7.3  ALBUMIN 5.2* 3.6   No results for input(s): LIPASE,  AMYLASE in the last 168 hours. No results for input(s): AMMONIA in the last 168 hours. CBC: Recent Labs  Lab 12/13/19 1310 12/14/19 1827 12/15/19 0623  WBC 10.5 12.8* 9.0  NEUTROABS  --  9.2*  --   HGB 14.4 16.6 13.4  HCT 44.3 50.9 41.0  MCV 87.9 89.1 87.8  PLT 348 409* 316   Cardiac Enzymes: No results for input(s): CKTOTAL, CKMB, CKMBINDEX, TROPONINI in the last 168 hours. BNP: Invalid input(s): POCBNP CBG: Recent Labs  Lab 12/14/19 1727 12/15/19 0746  GLUCAP 162* 110*   D-Dimer No results for input(s): DDIMER in the last 72 hours. Hgb A1c No results for input(s): HGBA1C in the last 72 hours. Lipid Profile No results for input(s): CHOL, HDL, LDLCALC, TRIG, CHOLHDL, LDLDIRECT in the last 72 hours. Thyroid function studies No results for input(s): TSH, T4TOTAL, T3FREE, THYROIDAB in the last 72 hours.  Invalid input(s): FREET3 Anemia work up No results for input(s): VITAMINB12, FOLATE, FERRITIN, TIBC, IRON, RETICCTPCT in the last 72 hours. Urinalysis    Component Value Date/Time   COLORURINE AMBER (A) 12/14/2019 1828   APPEARANCEUR CLOUDY (A) 12/14/2019 1828   LABSPEC 1.028 12/14/2019 1828   PHURINE 5.0 12/14/2019 1828   GLUCOSEU NEGATIVE 12/14/2019 1828   HGBUR SMALL (A) 12/14/2019  1828   BILIRUBINUR NEGATIVE 12/14/2019 1828   KETONESUR NEGATIVE 12/14/2019 1828   PROTEINUR 100 (A) 12/14/2019 1828   UROBILINOGEN 0.2 07/29/2014 0006   NITRITE NEGATIVE 12/14/2019 1828   LEUKOCYTESUR NEGATIVE 12/14/2019 1828   Sepsis Labs Invalid input(s): PROCALCITONIN,  WBC,  LACTICIDVEN Microbiology Recent Results (from the past 240 hour(s))  Respiratory Panel by RT PCR (Flu A&B, Covid) - Nasopharyngeal Swab     Status: None   Collection Time: 12/13/19 12:47 PM   Specimen: Nasopharyngeal Swab  Result Value Ref Range Status   SARS Coronavirus 2 by RT PCR NEGATIVE NEGATIVE Final    Comment: (NOTE) SARS-CoV-2 target nucleic acids are NOT DETECTED.  The SARS-CoV-2 RNA is generally detectable in upper respiratoy specimens during the acute phase of infection. The lowest concentration of SARS-CoV-2 viral copies this assay can detect is 131 copies/mL. A negative result does not preclude SARS-Cov-2 infection and should not be used as the sole basis for treatment or other patient management decisions. A negative result may occur with  improper specimen collection/handling, submission of specimen other than nasopharyngeal swab, presence of viral mutation(s) within the areas targeted by this assay, and inadequate number of viral copies (<131 copies/mL). A negative result must be combined with clinical observations, patient history, and epidemiological information. The expected result is Negative.  Fact Sheet for Patients:  PinkCheek.be  Fact Sheet for Healthcare Providers:  GravelBags.it  This test is no t yet approved or cleared by the [place] FDA and  has been authorized for detection and/or diagnosis of SARS-CoV-2 by FDA under an Emergency Use Authorization (EUA). This EUA will remain  in effect (meaning this test can be used) for the duration of the COVID-19 declaration under Section 564(b)(1) of the Act, 21  U.S.C. section 360bbb-3(b)(1), unless the authorization is terminated or revoked sooner.     Influenza A by PCR NEGATIVE NEGATIVE Final   Influenza B by PCR NEGATIVE NEGATIVE Final    Comment: (NOTE) The Xpert Xpress SARS-CoV-2/FLU/RSV assay is intended as an aid in  the diagnosis of influenza from Nasopharyngeal swab specimens and  should not be used as a sole basis for treatment. Nasal washings and  aspirates are unacceptable for  Xpert Xpress SARS-CoV-2/FLU/RSV  testing.  Fact Sheet for Patients: PinkCheek.be  Fact Sheet for Healthcare Providers: GravelBags.it  This test is not yet approved or cleared by the [place] FDA and  has been authorized for detection and/or diagnosis of SARS-CoV-2 by  FDA under an Emergency Use Authorization (EUA). This EUA will remain  in effect (meaning this test can be used) for the duration of the  Covid-19 declaration under Section 564(b)(1) of the Act, 21  U.S.C. section 360bbb-3(b)(1), unless the authorization is  terminated or revoked. Performed at [facility], [address], [place], [place] [address]   Group A Strep by PCR     Status: None   Collection Time: 12/13/19 12:47 PM   Specimen: Nasopharyngeal Swab; Sterile Swab  Result Value Ref Range Status   Group A Strep by PCR NOT DETECTED NOT DETECTED Final    Comment: Performed at [facility], [address] [place], [place] [address]  Respiratory Panel by RT PCR (Flu A&B, Covid) - Nasopharyngeal Swab     Status: None   Collection Time: 12/14/19  9:26 PM   Specimen: Nasopharyngeal Swab  Result Value Ref Range Status   SARS Coronavirus 2 by RT PCR NEGATIVE NEGATIVE Final    Comment: (NOTE) SARS-CoV-2 target nucleic acids are NOT DETECTED.  The SARS-CoV-2 RNA is generally detectable in upper respiratoy specimens during the acute phase of infection. The lowest concentration of SARS-CoV-2 viral copies this assay can detect  is 131 copies/mL. A negative result does not preclude SARS-Cov-2 infection and should not be used as the sole basis for treatment or other patient management decisions. A negative result may occur with  improper specimen collection/handling, submission of specimen other than nasopharyngeal swab, presence of viral mutation(s) within the areas targeted by this assay, and inadequate number of viral copies (<131 copies/mL). A negative result must be combined with clinical observations, patient history, and epidemiological information. The expected result is Negative.  Fact Sheet for Patients:  PinkCheek.be  Fact Sheet for Healthcare Providers:  GravelBags.it  This test is no t yet approved or cleared by the [place] FDA and  has been authorized for detection and/or diagnosis of SARS-CoV-2 by FDA under an Emergency Use Authorization (EUA). This EUA will remain  in effect (meaning this test can be used) for the duration of the COVID-19 declaration under Section 564(b)(1) of the Act, 21 U.S.C. section 360bbb-3(b)(1), unless the authorization is terminated or revoked sooner.     Influenza A by PCR NEGATIVE NEGATIVE Final   Influenza B by PCR NEGATIVE NEGATIVE Final    Comment: (NOTE) The Xpert Xpress SARS-CoV-2/FLU/RSV assay is intended as an aid in  the diagnosis of influenza from Nasopharyngeal swab specimens and  should not be used as a sole basis for treatment. Nasal washings and  aspirates are unacceptable for Xpert Xpress SARS-CoV-2/FLU/RSV  testing.  Fact Sheet for Patients: PinkCheek.be  Fact Sheet for Healthcare Providers: GravelBags.it  This test is not yet approved or cleared by the [place] FDA and  has been authorized for detection and/or diagnosis of SARS-CoV-2 by  FDA under an Emergency Use Authorization (EUA). This EUA will remain  in effect  (meaning this test can be used) for the duration of the  Covid-19 declaration under Section 564(b)(1) of the Act, 21  U.S.C. section 360bbb-3(b)(1), unless the authorization is  terminated or revoked. Performed at [facility], [address] [place], [place] [address]      Time coordinating discharge: 35 minutes  SIGNED:   Rodena Goldmann, DO [facility] 12/15/2019, 9:09 AM  If 7PM-7AM, please contact night-coverage [email or web address]

## 2019-12-15 NOTE — ED Notes (Signed)
Pt has pulled off all monitoring equipment x 3. States he is leaving. Pt very fidgety in room. Pt states if we put them back on; he will "just pull them back off". Dr. Thomes Dinning notified.

## 2019-12-15 NOTE — Progress Notes (Signed)
Discharge instructions reviewed with patient, stated understanding. Pt discharged via wheelchair to main lobby. Security personnel returned pt's knife to him, pts other belongings in his possession. Pt called mother and friend who were unable to come pick him up. Pt then walked to Radiology department to ask a different friend for a ride home.

## 2019-12-19 LAB — CULTURE, BLOOD (ROUTINE X 2)
Culture: NO GROWTH
Culture: NO GROWTH
Special Requests: ADEQUATE
Special Requests: ADEQUATE

## 2020-09-23 ENCOUNTER — Ambulatory Visit (HOSPITAL_COMMUNITY)
Admission: RE | Admit: 2020-09-23 | Discharge: 2020-09-23 | Disposition: A | Discharge status: Home / Self Care | Payer: Self-pay | Source: Ambulatory Visit | Attending: Urology | Admitting: Urology

## 2020-09-23 ENCOUNTER — Encounter: Payer: Self-pay | Admitting: Urology

## 2020-09-23 ENCOUNTER — Ambulatory Visit (INDEPENDENT_AMBULATORY_CARE_PROVIDER_SITE_OTHER): Payer: Self-pay | Admitting: Urology

## 2020-09-23 ENCOUNTER — Ambulatory Visit: Payer: Self-pay | Admitting: Urology

## 2020-09-23 ENCOUNTER — Other Ambulatory Visit: Payer: Self-pay

## 2020-09-23 VITALS — BP 167/108 | HR 78 | Temp 98.4°F

## 2020-09-23 DIAGNOSIS — R1032 Left lower quadrant pain: Secondary | ICD-10-CM

## 2020-09-23 DIAGNOSIS — N200 Calculus of kidney: Secondary | ICD-10-CM

## 2020-09-23 MED ORDER — TAMSULOSIN HCL 0.4 MG PO CAPS: 0.4000 mg | ORAL_CAPSULE | Freq: Every day | ORAL | 0 refills | Status: DC

## 2020-09-23 MED ORDER — OXYCODONE-ACETAMINOPHEN 5-325 MG PO TABS: 1.0000 | ORAL_TABLET | ORAL | 0 refills | Status: DC | PRN

## 2020-09-23 MED ORDER — ONDANSETRON 4 MG PO TBDP: 4.0000 mg | ORAL_TABLET | Freq: Three times a day (TID) | ORAL | 0 refills | Status: DC | PRN

## 2020-09-23 NOTE — Progress Notes (Signed)
Urological Symptom Review  Patient is experiencing the following symptoms: Stream starts and stops Trouble starting stream Have to strain to urinate Weak stream   Review of Systems  Gastrointestinal (upper)  : Indigestion/heartburn  Gastrointestinal (lower) : Negative for lower GI symptoms  Constitutional : Fatigue  Skin: Negative for skin symptoms  Eyes: Negative for eye symptoms  Ear/Nose/Throat : Negative for Ear/Nose/Throat symptoms  Hematologic/Lymphatic: Negative for Hematologic/Lymphatic symptoms  Cardiovascular : Negative for cardiovascular symptoms  Respiratory : Negative for respiratory symptoms  Endocrine: Excessive thirst  Musculoskeletal: Back pain  Neurological: Headaches  Psychologic: Negative for psychiatric symptoms

## 2020-09-23 NOTE — Patient Instructions (Signed)
Textbook of Natural Medicine (5th ed., pp. 1518-1527.e3). [place], [place]: Elsevier.">  Dietary Guidelines to Help Prevent Kidney Stones Kidney stones are deposits of minerals and salts that form inside your kidneys. Your risk of developing kidney stones may be greater depending on your diet, your lifestyle, the medicines you take, and whether you have certain medical conditions. Most people can lower their chances of developing kidney stones by following the instructions below. Your dietitian may give you more specific instructions depending on your overall health and the type of kidney stones youtend to develop. What are tips for following this plan? Reading food labels  Choose foods with "no salt added" or "low-salt" labels. Limit your salt (sodium) intake to less than 1,500 mg a day. Choose foods with calcium for each meal and snack. Try to eat about 300 mg of calcium at each meal. Foods that contain 200-500 mg of calcium a serving include: 8 oz (237 mL) of milk, calcium-fortifiednon-dairy milk, and calcium-fortifiedfruit juice. Calcium-fortified means that calcium has been added to these drinks. 8 oz (237 mL) of kefir, yogurt, and soy yogurt. 4 oz (114 g) of tofu. 1 oz (28 g) of cheese. 1 cup (150 g) of dried figs. 1 cup (91 g) of cooked broccoli. One 3 oz (85 g) can of sardines or mackerel. Most people need 1,000-1,500 mg of calcium a day. Talk to your dietitian abouthow much calcium is recommended for you. Shopping Buy plenty of fresh fruits and vegetables. Most people do not need to avoid fruits and vegetables, even if these foods contain nutrients that may contribute to kidney stones. When shopping for convenience foods, choose: Whole pieces of fruit. Pre-made salads with dressing on the side. Low-fat fruit and yogurt smoothies. Avoid buying frozen meals or prepared deli foods. These can be high in sodium. Look for foods with live cultures, such as yogurt and kefir. Choose high-fiber  grains, such as whole-wheat breads, oat bran, and wheat cereals. Cooking Do not add salt to food when cooking. Place a salt shaker on the table and allow each person to add his or her own salt to taste. Use vegetable protein, such as beans, textured vegetable protein (TVP), or tofu, instead of meat in pasta, casseroles, and soups. Meal planning Eat less salt, if told by your dietitian. To do this: Avoid eating processed or pre-made food. Avoid eating fast food. Eat less animal protein, including cheese, meat, poultry, or fish, if told by your dietitian. To do this: Limit the number of times you have meat, poultry, fish, or cheese each week. Eat a diet free of meat at least 2 days a week. Eat only one serving each day of meat, poultry, fish, or seafood. When you prepare animal protein, cut pieces into small portion sizes. For most meat and fish, one serving is about the size of the palm of your hand. Eat at least five servings of fresh fruits and vegetables each day. To do this: Keep fruits and vegetables on hand for snacks. Eat one piece of fruit or a handful of berries with breakfast. Have a salad and fruit at lunch. Have two kinds of vegetables at dinner. Limit foods that are high in a substance called oxalate. These include: Spinach (cooked), rhubarb, beets, sweet potatoes, and Swiss chard. Peanuts. Potato chips, french fries, and baked potatoes with skin on. Nuts and nut products. Chocolate. If you regularly take a diuretic medicine, make sure to eat at least 1 or 2 servings of fruits or vegetables that are   high in potassium each day. These include: Avocado. Banana. Orange, prune, carrot, or tomato juice. Baked potato. Cabbage. Beans and split peas. Lifestyle  Drink enough fluid to keep your urine pale yellow. This is the most important thing you can do. Spread your fluid intake throughout the day. If you drink alcohol: Limit how much you use to: 0-1 drink a day for women who  are not pregnant. 0-2 drinks a day for men. Be aware of how much alcohol is in your drink. In the [place] one drink equals one 12 oz bottle of beer (355 mL), one 5 oz glass of wine (148 mL), or one 1 oz glass of hard liquor (44 mL). Lose weight if told by your health care provider. Work with your dietitian to find an eating plan and weight loss strategies that work best for you.  General information Talk to your health care provider and dietitian about taking daily supplements. You may be told the following depending on your health and the cause of your kidney stones: Not to take supplements with vitamin C. To take a calcium supplement. To take a daily probiotic supplement. To take other supplements such as magnesium, fish oil, or vitamin B6. Take over-the-counter and prescription medicines only as told by your health care provider. These include supplements. What foods should I limit? Limit your intake of the following foods, or eat them as told by your dietitian. Vegetables Spinach. Rhubarb. Beets. Canned vegetables. Pickles. Olives. Baked potatoeswith skin. Grains Wheat bran. Baked goods. Salted crackers. Cereals high in sugar. Meats and other proteins Nuts. Nut butters. Large portions of meat, poultry, or fish. Salted, precooked,or cured meats, such as sausages, meat loaves, and hot dogs. Dairy Cheese. Beverages Regular soft drinks. Regular vegetable juice. Seasonings and condiments Seasoning blends with salt. Salad dressings. Soy sauce. Ketchup. Barbecue sauce. Other foods Canned soups. Canned pasta sauce. Casseroles. Pizza. Lasagna. Frozen meals.Potato chips. French fries. The items listed above may not be a complete list of foods and beverages you should limit. Contact a dietitian for more information. What foods should I avoid? Talk to your dietitian about specific foods you should avoid based on the typeof kidney stones you have and your overall health. Fruits Grapefruit. The  item listed above may not be a complete list of foods and beverages you should avoid. Contact a dietitian for more information. Summary Kidney stones are deposits of minerals and salts that form inside your kidneys. You can lower your risk of kidney stones by making changes to your diet. The most important thing you can do is drink enough fluid. Drink enough fluid to keep your urine pale yellow. Talk to your dietitian about how much calcium you should have each day, and eat less salt and animal protein as told by your dietitian. This information is not intended to replace advice given to you by your health care provider. Make sure you discuss any questions you have with your healthcare provider. Document Revised: 02/12/2019 Document Reviewed: 02/12/2019 Elsevier Patient Education  2022 Elsevier Inc.  
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
## 2020-09-26 ENCOUNTER — Telehealth: Payer: Self-pay

## 2020-09-26 NOTE — Telephone Encounter (Signed)
Returned patient call- pt stated Dr. Margo Aye office is attempting to reach him to schedule his colonoscopy. Informed patient this is Dr. Ronne Binning and he would need to return Dr. Margo Aye office for colonoscopy. Pt voiced understanding.   Patient has upcoming appt- patient aware he will get his CT results at upcoming appt

## 2020-09-26 NOTE — Telephone Encounter (Signed)
Patient wants to know what the results of the xray that was recently done.  Dr. Margo Aye is wanting pt to have a colonoscopy done. Wants to know if there is something that was seen in the xray.  Call back:  [phone]  Please advise.  Thanks, Rosey Bath

## 2020-10-10 ENCOUNTER — Encounter: Payer: Self-pay | Admitting: Urology

## 2020-10-10 ENCOUNTER — Ambulatory Visit (INDEPENDENT_AMBULATORY_CARE_PROVIDER_SITE_OTHER): Payer: Self-pay | Admitting: Urology

## 2020-10-10 ENCOUNTER — Other Ambulatory Visit: Payer: Self-pay

## 2020-10-10 VITALS — BP 168/82 | HR 82

## 2020-10-10 DIAGNOSIS — R3912 Poor urinary stream: Secondary | ICD-10-CM

## 2020-10-10 DIAGNOSIS — N401 Enlarged prostate with lower urinary tract symptoms: Secondary | ICD-10-CM

## 2020-10-10 DIAGNOSIS — N200 Calculus of kidney: Secondary | ICD-10-CM

## 2020-10-10 DIAGNOSIS — N138 Other obstructive and reflux uropathy: Secondary | ICD-10-CM

## 2020-10-10 MED ORDER — CYCLOBENZAPRINE HCL 5 MG PO TABS: 5.0000 mg | ORAL_TABLET | Freq: Three times a day (TID) | ORAL | 1 refills | Status: DC | PRN

## 2020-10-10 NOTE — Patient Instructions (Signed)
Acute Back Pain, Adult Acute back pain is sudden and usually short-lived. It is often caused by an injury to the muscles and tissues in the back. The injury may result from: A muscle or ligament getting overstretched or torn (strained). Ligaments are tissues that connect bones to each other. Lifting something improperly can cause a back strain. Wear and tear (degeneration) of the spinal disks. Spinal disks are circular tissue that provide cushioning between the bones of the spine (vertebrae). Twisting motions, such as while playing sports or doing yard work. A hit to the back. Arthritis. You may have a physical exam, lab tests, and imaging tests to find the cause ofyour pain. Acute back pain usually goes away with rest and home care. Follow these instructions at home: Managing pain, stiffness, and swelling Treatment may include medicines for pain and inflammation that are taken by mouth or applied to the skin, prescription pain medicine, or muscle relaxants. Take over-the-counter and prescription medicines only as told by your health care provider. Your health care provider may recommend applying ice during the first 24-48 hours after your pain starts. To do this: Put ice in a plastic bag. Place a towel between your skin and the bag. Leave the ice on for 20 minutes, 2-3 times a day. If directed, apply heat to the affected area as often as told by your health care provider. Use the heat source that your health care provider recommends, such as a moist heat pack or a heating pad. Place a towel between your skin and the heat source. Leave the heat on for 20-30 minutes. Remove the heat if your skin turns bright red. This is especially important if you are unable to feel pain, heat, or cold. You have a greater risk of getting burned. Activity  Do not stay in bed. Staying in bed for more than 1-2 days can delay your recovery. Sit up and stand up straight. Avoid leaning forward when you sit or  hunching over when you stand. If you work at a desk, sit close to it so you do not need to lean over. Keep your chin tucked in. Keep your neck drawn back, and keep your elbows bent at a 90-degree angle (right angle). Sit high and close to the steering wheel when you drive. Add lower back (lumbar) support to your car seat, if needed. Take short walks on even surfaces as soon as you are able. Try to increase the length of time you walk each day. Do not sit, drive, or stand in one place for more than 30 minutes at a time. Sitting or standing for long periods of time can put stress on your back. Do not drive or use heavy machinery while taking prescription pain medicine. Use proper lifting techniques. When you bend and lift, use positions that put less stress on your back: Bend your knees. Keep the load close to your body. Avoid twisting. Exercise regularly as told by your health care provider. Exercising helps your back heal faster and helps prevent back injuries by keeping muscles strong and flexible. Work with a physical therapist to make a safe exercise program, as recommended by your health care provider. Do any exercises as told by your physical therapist.  Lifestyle Maintain a healthy weight. Extra weight puts stress on your back and makes it difficult to have good posture. Avoid activities or situations that make you feel anxious or stressed. Stress and anxiety increase muscle tension and can make back pain worse. Learn ways to manage   anxiety and stress, such as through exercise. General instructions Sleep on a firm mattress in a comfortable position. Try lying on your side with your knees slightly bent. If you lie on your back, put a pillow under your knees. Follow your treatment plan as told by your health care provider. This may include: Cognitive or behavioral therapy. Acupuncture or massage therapy. Meditation or yoga. Contact a health care provider if: You have pain that is not  relieved with rest or medicine. You have increasing pain going down into your legs or buttocks. Your pain does not improve after 2 weeks. You have pain at night. You lose weight without trying. You have a fever or chills. Get help right away if: You develop new bowel or bladder control problems. You have unusual weakness or numbness in your arms or legs. You develop nausea or vomiting. You develop abdominal pain. You feel faint. Summary Acute back pain is sudden and usually short-lived. Use proper lifting techniques. When you bend and lift, use positions that put less stress on your back. Take over-the-counter and prescription medicines and apply heat or ice as directed by your health care provider. This information is not intended to replace advice given to you by your health care provider. Make sure you discuss any questions you have with your healthcare provider. Document Revised: 11/10/2019 Document Reviewed: 11/13/2019 Elsevier Patient Education  2022 Elsevier Inc.  

## 2020-10-10 NOTE — Progress Notes (Signed)
Urological Symptom Review  Patient is experiencing the following symptoms: Leakage of urine Stream starts and stops Trouble starting stream Have to strain to urinate   Review of Systems  Gastrointestinal (upper)  : Indigestion/heartburn  Gastrointestinal (lower) : Negative for lower GI symptoms  Constitutional : Night Sweats  Skin: Negative for skin symptoms  Eyes: Blurred vision  Ear/Nose/Throat : Sinus problems  Hematologic/Lymphatic: Negative for Hematologic/Lymphatic symptoms  Cardiovascular : Negative for cardiovascular symptoms  Respiratory : Negative for respiratory symptoms  Endocrine: Negative for endocrine symptoms  Musculoskeletal: Back pain  Neurological: Headaches  Psychologic: Negative for psychiatric symptoms
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
## 2020-10-14 ENCOUNTER — Telehealth: Payer: Self-pay

## 2020-10-14 NOTE — Telephone Encounter (Signed)
Per [facility] we will not accept this pt as PCP - patient called

## 2020-11-09 ENCOUNTER — Ambulatory Visit: Payer: Self-pay | Admitting: Urology

## 2021-01-13 ENCOUNTER — Other Ambulatory Visit: Payer: Self-pay

## 2021-01-13 ENCOUNTER — Emergency Department (HOSPITAL_COMMUNITY)
Admission: EM | Admit: 2021-01-13 | Discharge: 2021-01-13 | Disposition: A | Discharge status: Home / Self Care | Payer: Self-pay | Attending: Emergency Medicine | Admitting: Emergency Medicine

## 2021-01-13 ENCOUNTER — Encounter (HOSPITAL_COMMUNITY): Payer: Self-pay | Admitting: Emergency Medicine

## 2021-01-13 ENCOUNTER — Emergency Department (HOSPITAL_COMMUNITY): Payer: Self-pay

## 2021-01-13 DIAGNOSIS — Z87891 Personal history of nicotine dependence: Secondary | ICD-10-CM | POA: Insufficient documentation

## 2021-01-13 DIAGNOSIS — I10 Essential (primary) hypertension: Secondary | ICD-10-CM | POA: Insufficient documentation

## 2021-01-13 DIAGNOSIS — M545 Low back pain, unspecified: Secondary | ICD-10-CM

## 2021-01-13 DIAGNOSIS — Z79899 Other long term (current) drug therapy: Secondary | ICD-10-CM | POA: Insufficient documentation

## 2021-01-13 DIAGNOSIS — N200 Calculus of kidney: Secondary | ICD-10-CM | POA: Insufficient documentation

## 2021-01-13 MED ORDER — KETOROLAC TROMETHAMINE 60 MG/2ML IM SOLN
60.0000 mg | Freq: Once | INTRAMUSCULAR | Status: AC
  Administered 2021-01-13: 60 mg via INTRAMUSCULAR
  Filled 2021-01-13: qty 2

## 2021-01-13 MED ORDER — CYCLOBENZAPRINE HCL 10 MG PO TABS: 5.0000 mg | ORAL_TABLET | Freq: Two times a day (BID) | ORAL | 0 refills | Status: DC | PRN

## 2021-01-13 MED ORDER — NAPROXEN 375 MG PO TABS: 375.0000 mg | ORAL_TABLET | Freq: Two times a day (BID) | ORAL | 0 refills | Status: DC

## 2021-01-13 NOTE — Discharge Instructions (Signed)
SEEK IMMEDIATE MEDICAL ATTENTION IF: New numbness, tingling, weakness, or problem with the use of your arms or legs.  Severe back pain not relieved with medications.  Change in bowel or bladder control.  Increasing pain in any areas of the body (such as chest or abdominal pain).  Shortness of breath, dizziness or fainting.  Nausea (feeling sick to your stomach), vomiting, fever, or sweats.  
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
## 2021-03-31 ENCOUNTER — Emergency Department (HOSPITAL_COMMUNITY)
Admission: EM | Admit: 2021-03-31 | Discharge: 2021-03-31 | Discharge status: Against Medical Advice | Payer: Self-pay | Source: Home / Self Care

## 2021-04-03 ENCOUNTER — Other Ambulatory Visit: Payer: Self-pay

## 2021-04-03 ENCOUNTER — Encounter (HOSPITAL_COMMUNITY): Payer: Self-pay | Admitting: *Deleted

## 2021-04-03 ENCOUNTER — Emergency Department (HOSPITAL_COMMUNITY)
Admission: EM | Admit: 2021-04-03 | Discharge: 2021-04-04 | Disposition: A | Discharge status: Home / Self Care | Payer: Medicaid Other | Attending: Emergency Medicine | Admitting: Emergency Medicine

## 2021-04-03 ENCOUNTER — Emergency Department (HOSPITAL_COMMUNITY): Payer: Medicaid Other

## 2021-04-03 DIAGNOSIS — R112 Nausea with vomiting, unspecified: Secondary | ICD-10-CM | POA: Diagnosis not present

## 2021-04-03 DIAGNOSIS — H532 Diplopia: Secondary | ICD-10-CM | POA: Insufficient documentation

## 2021-04-03 DIAGNOSIS — D72829 Elevated white blood cell count, unspecified: Secondary | ICD-10-CM | POA: Insufficient documentation

## 2021-04-03 DIAGNOSIS — K047 Periapical abscess without sinus: Secondary | ICD-10-CM | POA: Insufficient documentation

## 2021-04-03 DIAGNOSIS — R000 Tachycardia, unspecified: Secondary | ICD-10-CM | POA: Insufficient documentation

## 2021-04-03 DIAGNOSIS — Z79899 Other long term (current) drug therapy: Secondary | ICD-10-CM | POA: Insufficient documentation

## 2021-04-03 DIAGNOSIS — R220 Localized swelling, mass and lump, head: Secondary | ICD-10-CM | POA: Diagnosis not present

## 2021-04-03 DIAGNOSIS — K029 Dental caries, unspecified: Secondary | ICD-10-CM | POA: Diagnosis not present

## 2021-04-03 LAB — BASIC METABOLIC PANEL
Anion gap: 11 (ref 5–15)
BUN: 18 mg/dL (ref 6–20)
CO2: 29 mmol/L (ref 22–32)
Calcium: 9.2 mg/dL (ref 8.9–10.3)
Chloride: 99 mmol/L (ref 98–111)
Creatinine, Ser: 1.31 mg/dL — ABNORMAL HIGH (ref 0.61–1.24)
GFR, Estimated: >60 mL/min (ref >60)
Glucose, Bld: 106 mg/dL — ABNORMAL HIGH (ref 70–99)
Potassium: 4.2 mmol/L (ref 3.5–5.1)
Sodium: 139 mmol/L (ref 135–145)

## 2021-04-03 LAB — CBC WITH DIFFERENTIAL/PLATELET
Abs Immature Granulocytes: 0.02 10*3/uL (ref 0.00–0.07)
Basophils Absolute: 0 10*3/uL (ref 0.0–0.1)
Basophils Relative: 0 %
Eosinophils Absolute: 0.1 10*3/uL (ref 0.0–0.5)
Eosinophils Relative: 0 %
HCT: 47.7 % (ref 39.0–52.0)
Hemoglobin: 15.6 g/dL (ref 13.0–17.0)
Immature Granulocytes: 0 %
Lymphocytes Relative: 23 %
Lymphs Abs: 2.9 10*3/uL (ref 0.7–4.0)
MCH: 29.2 pg (ref 26.0–34.0)
MCHC: 32.7 g/dL (ref 30.0–36.0)
MCV: 89.2 fL (ref 80.0–100.0)
Monocytes Absolute: 1.2 10*3/uL — ABNORMAL HIGH (ref 0.1–1.0)
Monocytes Relative: 10 %
Neutro Abs: 8.3 10*3/uL — ABNORMAL HIGH (ref 1.7–7.7)
Neutrophils Relative %: 67 %
Platelets: 282 10*3/uL (ref 150–400)
RBC: 5.35 MIL/uL (ref 4.22–5.81)
RDW: 13.4 % (ref 11.5–15.5)
WBC: 12.5 10*3/uL — ABNORMAL HIGH (ref 4.0–10.5)
nRBC: 0 % (ref 0.0–0.2)

## 2021-04-03 MED ORDER — IOHEXOL 300 MG/ML  SOLN
75.0000 mL | Freq: Once | INTRAMUSCULAR | Status: AC | PRN
  Administered 2021-04-03: 75 mL via INTRAVENOUS

## 2021-04-03 NOTE — ED Triage Notes (Signed)
Pt with left facial swelling for a week, around left eye.  Swelling comes and goes but seems to be getting worse each time. Double vision -denies at present.  Emesis yesterday and this morning.

## 2021-04-03 NOTE — ED Provider Notes (Addendum)
[facility] EMERGENCY DEPARTMENT Provider Note   CSN: [id number] Arrival date & time: 04/03/21  1932     History Chief Complaint  Patient presents with   Facial Swelling    Benjamin Vaughn. is a 51 y.o. male presents to the ER for evaluation of left sided facial pain and intermittent swelling for the past week. Additionally, he endorses some discoloration to the area at times as well. When the pain and swelling are increased, he endorses some diplopia occasionally from both eyes. He is not experiencing any of these symptoms currently. He reports he was having some nausea and 8-10 episodes of vomiting today. Denies any abdominal pain, hematemesis, coffee ground emesis, dysuria, hematuria, or dark/tarry stools. Denies any chest pain, SOB, fevers, rhinorrhea, nasal congestion, or sore throat. He denies any dental pain and mentions that he has not seen a dentist in "a few years". He denies any tobacco, EtOH, or illicit drug use. Denies any IVDU ever.   HPI     Home Medications Prior to Admission medications   Medication Sig Start Date End Date Taking? Authorizing Provider  amLODipine (NORVASC) 5 MG tablet Take 1 tablet (5 mg total) by mouth daily. 12/13/19   Daleen Bo, MD  cyclobenzaprine (FLEXERIL) 10 MG tablet Take 0.5-1 tablets (5-10 mg total) by mouth 2 (two) times daily as needed for muscle spasms. 01/13/21   Margarita Mail, PA-C  cyclobenzaprine (FLEXERIL) 5 MG tablet Take 1 tablet (5 mg total) by mouth 3 (three) times daily as needed for muscle spasms. 10/10/20   McKenzie, Candee Furbish, MD  diphenhydrAMINE (BENADRYL) 25 MG tablet Take 25 mg by mouth every 6 (six) hours as needed.    [provider]  Multiple Vitamin (MULTIVITAMIN WITH MINERALS) TABS tablet Take 1 tablet by mouth daily.    [provider]  naproxen (NAPROSYN) 375 MG tablet Take 1 tablet (375 mg total) by mouth 2 (two) times daily with a meal. 01/13/21   Harris, Abigail, PA-C  ondansetron (ZOFRAN  ODT) 4 MG disintegrating tablet Take 1 tablet (4 mg total) by mouth every 8 (eight) hours as needed for nausea. 03/31/19   Noemi Chapel, MD  ondansetron (ZOFRAN ODT) 4 MG disintegrating tablet Take 1 tablet (4 mg total) by mouth every 8 (eight) hours as needed for nausea or vomiting. 09/23/20   McKenzie, Candee Furbish, MD  oxyCODONE (ROXICODONE) 15 MG immediate release tablet Take 1 tablet (15 mg total) by mouth every 4 (four) hours as needed for moderate pain or severe pain. 04/02/19   McKenzie, Candee Furbish, MD  Oxycodone HCl 10 MG TABS Take 10 mg by mouth 4 (four) times daily as needed. 09/21/20   [provider]  oxyCODONE-acetaminophen (PERCOCET) 5-325 MG tablet Take 1 tablet by mouth every 4 (four) hours as needed. 09/23/20   McKenzie, Candee Furbish, MD  PROAIR HFA 108 [id number] Base) MCG/ACT inhaler Inhale 1-2 puffs into the lungs every 6 (six) hours as needed for wheezing or shortness of breath.  04/29/18   [provider]  tamsulosin (FLOMAX) 0.4 MG CAPS capsule Take 1 capsule (0.4 mg total) by mouth daily. 09/23/20   McKenzie, Candee Furbish, MD      Allergies    Aspirin, Bactrim, Hydrocodone-acetaminophen, Nsaids, Sulfamethoxazole, Tylenol [acetaminophen], Vicodin [hydrocodone-acetaminophen], and Sulfamethoxazole-trimethoprim    Review of Systems   Review of Systems  Constitutional:  Negative for chills and fever.  HENT:  Positive for facial swelling. Negative for congestion.   Respiratory:  Negative for cough and  shortness of breath.   Gastrointestinal:  Positive for nausea and vomiting. Negative for abdominal pain, constipation and diarrhea.   Physical Exam Updated Vital Signs BP (!) 155/105    Pulse (!) 115    Temp 98.6 F (37 C) (Oral)    Resp 20    Ht 5\' 11"  (1.803 m)    Wt 67 kg    SpO2 96%    BMI 20.60 kg/m  Physical Exam Vitals and nursing note reviewed.  Constitutional:      General: He is not in acute distress.    Appearance: He is not toxic-appearing.     Comments: Unkempt.  Anxious appearing.   HENT:     Head: Normocephalic and atraumatic.     Comments: Face symmetric. No facial swelling appreciated.     Right Ear: Tympanic membrane, ear canal and external ear normal.     Left Ear: Tympanic membrane, ear canal and external ear normal.     Nose: Nose normal.     Mouth/Throat:     Mouth: Mucous membranes are moist.     Pharynx: No oropharyngeal exudate or posterior oropharyngeal erythema.     Comments: Extremely poor dentition throughout. Brown teeth to the gums. Multiple missing teeth. Tenderness to palpation to the gums throughout upper and lower gums. No palpable fluctuation noted. No drainage visualized. Symmetric palate. Uvula midline.  Eyes:     General: No visual field deficit or scleral icterus.    Extraocular Movements: Extraocular movements intact.     Pupils: Pupils are equal, round, and reactive to light.  Cardiovascular:     Rate and Rhythm: Regular rhythm. Tachycardia present.  Pulmonary:     Effort: Pulmonary effort is normal. No respiratory distress.     Breath sounds: Normal breath sounds.  Abdominal:     General: Abdomen is flat. Bowel sounds are normal.     Palpations: Abdomen is soft.     Tenderness: There is no abdominal tenderness. There is no guarding or rebound.  Musculoskeletal:        General: No deformity.     Cervical back: Normal range of motion.  Skin:    General: Skin is warm and dry.  Neurological:     General: No focal deficit present.     Mental Status: He is alert. Mental status is at baseline.    ED Results / Procedures / Treatments   Labs (all labs ordered are listed, but only abnormal results are displayed) Labs Reviewed  CBC WITH DIFFERENTIAL/PLATELET - Abnormal; Notable for the following components:      Result Value   WBC 12.5 (*)    Neutro Abs 8.3 (*)    Monocytes Absolute 1.2 (*)    All other components within normal limits  BASIC METABOLIC PANEL - Abnormal; Notable for the following components:    Glucose, Bld 106 (*)    Creatinine, Ser 1.31 (*)    All other components within normal limits  URINALYSIS, ROUTINE W REFLEX MICROSCOPIC  RAPID URINE DRUG SCREEN, HOSP PERFORMED    EKG None  Radiology CT Maxillofacial W Contrast  Result Date: 04/03/2021 CLINICAL DATA:  Intermittent pain and swelling in the left periorbital/maxillary region with poor dentition EXAM: CT MAXILLOFACIAL WITH CONTRAST TECHNIQUE: Multidetector CT imaging of the maxillofacial structures was performed with intravenous contrast. Multiplanar CT image reconstructions were also generated. RADIATION DOSE REDUCTION: This exam was performed according to the departmental dose-optimization program which includes automated exposure control, adjustment of the mA and/or kV according  to patient size and/or use of iterative reconstruction technique. CONTRAST:  77mL OMNIPAQUE IOHEXOL 300 MG/ML  SOLN COMPARISON:  None. FINDINGS: Osseous: No acute fracture or mandibular dislocation. Periapical lucency about the roots of multiple maxillary and mandibular teeth, particularly the left maxillary third molar, left maxillary lateral incisor, right maxillary central incisor, right maxillary lateral incisor, right maxillary second molar, and right maxillary third molar. There is possible lucency extending from the roots of the right maxillary second molar into the right maxillary sinus, with mucosal thickening in that part of the maxillary sinus (series 7, image 36). Periapical lucency is also seen about the roots of the left mandibular molars, right mandibular bicuspid, and right mandibular molars. In addition there are multiple dental caries. No evidence of a focal collection in the soft tissues adjacent to the maxilla or mandible. Orbits: Negative. Sinuses: Mucosal thickening in the ethmoid air cells and bilateral maxillary sinuses. The mastoids are well aerated. Soft tissues: Hyperenhancement and mild skin thickening in the left periorbital soft  tissues, particularly superior to the left orbit (series 2, image 24). The remainder of the soft tissues is unremarkable. Limited intracranial: No significant or unexpected finding. IMPRESSION: 1. Hyperenhancement and mild skin thickening in the left periorbital soft tissues. The soft tissues are otherwise unremarkable. No focal collection to suggest soft tissue abscess. 2. Poor dentition, with periapical lucency about the roots of multiple maxillary and mandibular teeth, in addition to multifocal dental caries. There is possible cortical breakthrough into the right maxillary sinus from the root of the right maxillary second molar, with focal mucosal thickening in the right maxillary sinus in this location. Electronically Signed   By: Merilyn Baba M.D.   On: 04/03/2021 23:19    Procedures Procedures   Medications Ordered in ED Medications  iohexol (OMNIPAQUE) 300 MG/ML solution 75 mL (75 mLs Intravenous Contrast Given 04/03/21 2245)    ED Course/ Medical Decision Making/ A&P                           Medical Decision Making Amount and/or Complexity of Data Reviewed Labs: ordered. Radiology: ordered.  Risk OTC drugs. Prescription drug management.   51 year old male presents the emergency department for evaluation of intermittent facial swelling on the left for the past week.  Differential diagnosis includes but is not limited to allergic reaction, dental infection, sinus infection, periorbital cellulitis, deep space infection.  Initially, patient was tachycardic and is extremely anxious appearing.  He is now at a normal heart rate, afebrile, still hypertensive at 160/112.  Satting well on room air.  Physical exam is pertinent for overall anxious appearing and unkempt appearance.  Extremely poor dentition throughout with multiple broken brown teeth at the gums and missing teeth.  Tenderness palpation throughout the mouth on the gums, however there is no fluctuance, induration, or mass palpated  or visualized.  Palate is symmetric.  Uvula is midline.  The rest of the ENT exam is benign.  No septal ulceration.  After multiple questioning's, the patient denies any drug use or IV drug use ever.  On my physical exam, I do not appreciate any swelling to the left side of the face.  Did not notice any discoloration.  The patient does have a small papule/pustule in his left eyebrow that is consistent with a pimple.  It is mildly tender to touch and has no surrounding induration or fluctuance.  Given the patient's story, will order a CT max face with  contrast to rule out any abscess.  I independently reviewed and interpreted the patient's labs and imaging.  Labs show mildly elevated white count at 12.5 with a left shift.  No signs of anemia.  BMP shows glucose at 106 and creatinine of 1.31.  No electrolyte abnormality.  UDS and urinalysis ordered for rule out of any drug use, the patient did not provide a urine sample after being in the emergency room for over 5 hours.  CT of the max face shows hyperenhancement and mild skin thickening in the left periorbital soft tissues. The soft tissues are otherwise unremarkable. No focal collection to suggest soft tissue abscess. 2. Poor dentition, with periapical lucency about the roots of multiple maxillary and mandibular teeth, in addition to multifocal dental caries. There is possible cortical breakthrough into the right maxillary sinus from the root of the right maxillary second molar, with focal mucosal thickening in the right maxillary sinus in this location.  On reevaluation, the patient is drinking a water bottle and is in no acute distress.  Still mildly anxious appearing. He reports he has not vomited since this morning and is feeling ok still.  Requesting Tylenol for pain.  Tylenol ordered.  This patient's intermittent swelling is likely due to a dental infection.  Will prescribe clindamycin to cover any sinus and dental bacteria.  I sat down the patient had  a long discussion on the importance and need of following up with a dentist and ENT provider.  An ENT provider listed on his discharge paperwork along with dental resources.  I stressed to him the importance of taking the medication as prescribed to prevent a worsening infection.  Strict return precautions were discussed with the patient.  Patient agrees to plan.  Patient is stable being discharged home in good condition.  I discussed this case with my attending physician who cosigned this note including patient's presenting symptoms, physical exam, and planned diagnostics and interventions. Attending physician stated agreement with plan or made changes to plan which were implemented.   Final Clinical Impression(s) / ED Diagnoses Final diagnoses:  Dental abscess    Rx / DC Orders ED Discharge Orders          Ordered    clindamycin (CLEOCIN) 150 MG capsule  3 times daily        04/04/21 0026              Sherrell Puller, PA-C 04/05/21 1532    Sherrell Puller, PA-C 04/05/21 1533    Hayden Rasmussen, MD 04/06/21 2024

## 2021-04-04 MED ORDER — CLINDAMYCIN HCL 150 MG PO CAPS: 450.0000 mg | ORAL_CAPSULE | Freq: Three times a day (TID) | ORAL | 0 refills | Status: DC

## 2021-04-04 MED ORDER — ACETAMINOPHEN 325 MG PO TABS
650.0000 mg | ORAL_TABLET | Freq: Once | ORAL | Status: AC
  Administered 2021-04-04: 650 mg via ORAL
  Filled 2021-04-04: qty 2

## 2021-04-04 NOTE — ED Notes (Signed)
Pt tolerated fluids well.  

## 2021-04-04 NOTE — Discharge Instructions (Addendum)
You have been seen here today for evaluation of your left sided facial pain and swelling. Your CT shows numerous dental caries and infections. I have started you on Clindamycin, which is an antibiotic to take for this. Please take as prescribed and complete the entirety of the course.   I have placed the information for an ENT provider to this discharge paperwork. Please call to schedule an appointment immediately.   Additionally, I have included primary care provider information underneath as well as contact information for dentists.   If you have any concern, new or worsening symptoms including, blurry vision, worsening swelling/pain, or fever, return to the ER.   [facility] Primary Care Doctor List  Syliva Overman, MD. Specialty: [facility] Contact information: [address]  [place] [address]  [phone]   Lilyan Punt, MD. Specialty: [facility] Medicine Contact information: [address]  [place] [address]  [phone]   Avon Gully, MD Specialty: Internal Medicine Contact information: [address] [place] [address]  [phone]   Catalina Pizza, MD. Specialty: Internal Medicine Contact information: [address]  [place] [address]  [phone]    [facility] (Dr. Selena Batten) Specialty: Family Medicine Contact information: [address]  [place] [address]  [phone]   John Giovanni, MD. Specialty: [facility] Medicine Contact information: [address]  [address]  [place] [address]  [phone]   Carylon Perches, MD. Specialty: Internal Medicine Contact information: [address]  [address]  [place] [address]  [phone]    [facility]  [address] [place], [place] [address] [phone]  Services [facility] offers a variety of basic health services.  Services include but are not limited to: Blood pressure  checks  Heart rate checks  Blood sugar checks  Urine analysis  Rapid strep tests  Pregnancy tests.  Health education and referrals  People needing more complex services will be directed to a physician online. Using these virtual visits, doctors can evaluate and prescribe medicine and treatments. There will be no medication on-site, though [facility] will help patients fill their prescriptions at little to no cost.   For More information please go to: [email or web address]

## 2021-04-05 ENCOUNTER — Telehealth: Payer: Self-pay

## 2021-04-05 NOTE — Telephone Encounter (Signed)
Transition Care Management Unsuccessful Follow-up Telephone Call  Date of discharge and from where:  01/31/20233 from [facility]  Attempts:  1st Attempt  Reason for unsuccessful TCM follow-up call:  Voice mail full

## 2021-04-06 NOTE — Telephone Encounter (Signed)
Transition Care Management Unsuccessful Follow-up Telephone Call  Date of discharge and from where:  04/04/2021-[place]   Attempts:  2nd Attempt  Reason for unsuccessful TCM follow-up call:  Voice mail full

## 2021-04-07 NOTE — Telephone Encounter (Signed)
Transition Care Management Unsuccessful Follow-up Telephone Call  Date of discharge and from where:  04/04/2021 from [facility]  Attempts:  3rd Attempt  Reason for unsuccessful TCM follow-up call:  Unable to reach patient

## 2021-06-06 ENCOUNTER — Emergency Department (HOSPITAL_COMMUNITY)
Admission: EM | Admit: 2021-06-06 | Discharge: 2021-06-07 | Disposition: A | Discharge status: Home / Self Care | Payer: Medicaid Other | Attending: Emergency Medicine | Admitting: Emergency Medicine

## 2021-06-06 ENCOUNTER — Other Ambulatory Visit: Payer: Self-pay

## 2021-06-06 ENCOUNTER — Encounter (HOSPITAL_COMMUNITY): Payer: Self-pay

## 2021-06-06 DIAGNOSIS — Z77120 Contact with and (suspected) exposure to mold (toxic): Secondary | ICD-10-CM | POA: Diagnosis not present

## 2021-06-06 DIAGNOSIS — Z79899 Other long term (current) drug therapy: Secondary | ICD-10-CM | POA: Insufficient documentation

## 2021-06-06 DIAGNOSIS — I10 Essential (primary) hypertension: Secondary | ICD-10-CM | POA: Diagnosis not present

## 2021-06-06 DIAGNOSIS — H6123 Impacted cerumen, bilateral: Secondary | ICD-10-CM | POA: Diagnosis not present

## 2021-06-06 DIAGNOSIS — R451 Restlessness and agitation: Secondary | ICD-10-CM | POA: Insufficient documentation

## 2021-06-06 DIAGNOSIS — R6889 Other general symptoms and signs: Secondary | ICD-10-CM

## 2021-06-06 NOTE — ED Triage Notes (Addendum)
Pt c/o dizziness, feet tingling, throat hurts, feet hurt. Started x3 weeks ago.   ? ?BP 131/102 in triage and pt doesn't take his BP meds ?

## 2021-06-07 NOTE — Discharge Instructions (Signed)
You were seen today for multiple complaints and concern for black mold.  This needs to be worked up as an outpatient.  You need to establish primary care.  You were provided with a number to call.  You need to monitor your blood pressure and take your medications as previously prescribed. ?
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
## 2021-06-07 NOTE — ED Notes (Signed)
Pt refused to sign for discharge. Left cussing ?

## 2021-06-07 NOTE — ED Notes (Signed)
Pt's friend and ride home is at bedside ?

## 2021-06-08 ENCOUNTER — Telehealth: Payer: Self-pay

## 2021-06-08 NOTE — Telephone Encounter (Signed)
Transition Care Management Unsuccessful Follow-up Telephone Call ? ?Date of discharge and from where:  06/07/2021 from [facility] ? ?Attempts:  1st Attempt ? ?Reason for unsuccessful TCM follow-up call:  Missing or invalid number ? ? ? ?

## 2021-06-09 NOTE — Telephone Encounter (Signed)
Transition Care Management Unsuccessful Follow-up Telephone Call ? ?Date of discharge and from where:  06/07/2021 from [facility] ? ?Attempts:  2nd Attempt ? ?Reason for unsuccessful TCM follow-up call:  Missing or invalid number ? ? ? ?

## 2021-06-13 NOTE — Telephone Encounter (Signed)
Transition Care Management Unsuccessful Follow-up Telephone Call ? ?Date of discharge and from where:  06/07/2021-[place]  ? ?Attempts:  3rd Attempt ? ?Reason for unsuccessful TCM follow-up call:  Missing or invalid number ? ?  ?

## 2021-07-12 ENCOUNTER — Other Ambulatory Visit: Payer: Self-pay

## 2021-07-12 ENCOUNTER — Encounter (HOSPITAL_COMMUNITY): Payer: Self-pay

## 2021-07-12 ENCOUNTER — Emergency Department (HOSPITAL_COMMUNITY)
Admission: EM | Admit: 2021-07-12 | Discharge: 2021-07-12 | Disposition: A | Discharge status: Home / Self Care | Payer: Medicaid Other | Attending: Emergency Medicine | Admitting: Emergency Medicine

## 2021-07-12 DIAGNOSIS — R21 Rash and other nonspecific skin eruption: Secondary | ICD-10-CM | POA: Diagnosis not present

## 2021-07-12 DIAGNOSIS — L299 Pruritus, unspecified: Secondary | ICD-10-CM

## 2021-07-12 MED ORDER — PERMETHRIN 5 % EX CREA: TOPICAL_CREAM | CUTANEOUS | 0 refills | Status: DC

## 2021-07-12 NOTE — ED Provider Notes (Signed)
?[facility] EMERGENCY DEPARTMENT ?Provider Note ? ? ?CSN: [id number] ?Arrival date & time: 07/12/21  2217 ? ?  ? ?History ? ?Chief Complaint  ?Patient presents with  ? Rash  ? ? ?Benjamin Vaughn. is a 51 y.o. male. ? ?Patient presents ER chief complaint of rash.  He states he been having white spots over his entire body and they are itchy ongoing for 3 days.  He states that he sees small white spots that other people cannot seem to see at this time.  Otherwise denies any fevers or cough or vomiting or diarrhea.  Admits to using "crack a while ago." ? ? ?  ? ?Home Medications ?Prior to Admission medications   ?Medication Sig Start Date End Date Taking? Authorizing Provider  ?permethrin (ELIMITE) 5 % cream Apply to affected area once 07/12/21  Yes Beloit, Eustace Moore, MD  ?amLODipine (NORVASC) 5 MG tablet Take 1 tablet (5 mg total) by mouth daily. 12/13/19   Mancel Bale, MD  ?clindamycin (CLEOCIN) 150 MG capsule Take 3 capsules (450 mg total) by mouth 3 (three) times daily. 04/04/21   Achille Rich, PA-C  ?cyclobenzaprine (FLEXERIL) 10 MG tablet Take 0.5-1 tablets (5-10 mg total) by mouth 2 (two) times daily as needed for muscle spasms. 01/13/21   Arthor Captain, PA-C  ?cyclobenzaprine (FLEXERIL) 5 MG tablet Take 1 tablet (5 mg total) by mouth 3 (three) times daily as needed for muscle spasms. 10/10/20   McKenzie, Mardene Celeste, MD  ?diphenhydrAMINE (BENADRYL) 25 MG tablet Take 25 mg by mouth every 6 (six) hours as needed.    [provider]  ?Multiple Vitamin (MULTIVITAMIN WITH MINERALS) TABS tablet Take 1 tablet by mouth daily.    [provider]  ?naproxen (NAPROSYN) 375 MG tablet Take 1 tablet (375 mg total) by mouth 2 (two) times daily with a meal. 01/13/21   Harris, Abigail, PA-C  ?ondansetron (ZOFRAN ODT) 4 MG disintegrating tablet Take 1 tablet (4 mg total) by mouth every 8 (eight) hours as needed for nausea. 03/31/19   Eber Johnwesley Lederman, MD  ?ondansetron (ZOFRAN ODT) 4 MG disintegrating tablet Take 1  tablet (4 mg total) by mouth every 8 (eight) hours as needed for nausea or vomiting. 09/23/20   McKenzie, Mardene Celeste, MD  ?oxyCODONE (ROXICODONE) 15 MG immediate release tablet Take 1 tablet (15 mg total) by mouth every 4 (four) hours as needed for moderate pain or severe pain. 04/02/19   McKenzie, Mardene Celeste, MD  ?Oxycodone HCl 10 MG TABS Take 10 mg by mouth 4 (four) times daily as needed. 09/21/20   [provider]  ?oxyCODONE-acetaminophen (PERCOCET) 5-325 MG tablet Take 1 tablet by mouth every 4 (four) hours as needed. 09/23/20   McKenzie, Mardene Celeste, MD  ?[facility] HFA 108 (90 Base) MCG/ACT inhaler Inhale 1-2 puffs into the lungs every 6 (six) hours as needed for wheezing or shortness of breath.  04/29/18   [provider]  ?tamsulosin (FLOMAX) 0.4 MG CAPS capsule Take 1 capsule (0.4 mg total) by mouth daily. 09/23/20   McKenzie, Mardene Celeste, MD  ?   ? ?Allergies    ?Aspirin, Bactrim, Hydrocodone-acetaminophen, Nsaids, Sulfamethoxazole, Tylenol [acetaminophen], Vicodin [hydrocodone-acetaminophen], and Sulfamethoxazole-trimethoprim   ? ?Review of Systems   ?Review of Systems  ?Constitutional:  Negative for fever.  ?HENT:  Negative for ear pain and sore throat.   ?Eyes:  Negative for pain.  ?Respiratory:  Negative for cough.   ?Cardiovascular:  Negative for chest pain.  ?Gastrointestinal:  Negative for abdominal  pain.  ?Genitourinary:  Negative for flank pain.  ?Musculoskeletal:  Negative for back pain.  ?Skin:  Negative for color change and rash.  ?Neurological:  Negative for syncope.  ?All other systems reviewed and are negative. ? ?Physical Exam ?Updated Vital Signs ?BP (!) 164/147 (BP Location: Right Arm)   Pulse (!) 122   Temp (!) 97.5 ?F (36.4 ?C) (Oral)   Resp 17   Ht 5\' 11"  (1.803 m)   Wt 65.8 kg   SpO2 100%   BMI 20.22 kg/m?  ?Physical Exam ?Constitutional:   ?   Appearance: He is well-developed.  ?   Comments: Patient appears agitated continues to itch himself and unable to sit still.  ?HENT:   ?   Head: Normocephalic.  ?   Nose: Nose normal.  ?Eyes:  ?   Extraocular Movements: Extraocular movements intact.  ?Cardiovascular:  ?   Rate and Rhythm: Normal rate.  ?Pulmonary:  ?   Effort: Pulmonary effort is normal.  ?Skin: ?   Coloration: Skin is not jaundiced.  ?Neurological:  ?   Mental Status: He is alert. Mental status is at baseline.  ? ? ?ED Results / Procedures / Treatments   ?Labs ?(all labs ordered are listed, but only abnormal results are displayed) ?Labs Reviewed - No data to display ? ?EKG ?None ? ?Radiology ?No results found. ? ?Procedures ?Procedures  ? ? ?Medications Ordered in ED ?Medications - No data to display ? ?ED Course/ Medical Decision Making/ A&P ?  ?                        ?Medical Decision Making ? ?No lesion or insect bite mark or abrasion noted other than areas of excoriation from his fingernails. ? ?Patient advised outpatient follow-up with his doctor this week.  Counseled regarding drug use.  Advised immediate return if he has fevers or any additional concerns. ? ? ? ? ? ? ? ?Final Clinical Impression(s) / ED Diagnoses ?Final diagnoses:  ?Itchy skin  ? ? ?Rx / DC Orders ?ED Discharge Orders   ? ?      Ordered  ?  permethrin (ELIMITE) 5 % cream       ? 07/12/21 2307  ? ?  ?  ? ?  ? ? ?  ?2308, MD ?07/12/21 2307 ? ?

## 2021-07-12 NOTE — ED Triage Notes (Addendum)
Reports rash to skin x 3 days.  Reports rash inside mouth also.  I am unable to visualized said rash.  States that his urine is dark.   Resp even and unlabored.  Skin warm nad dry.  nad ?

## 2021-07-12 NOTE — Discharge Instructions (Signed)
Call your primary care doctor or specialist as discussed in the next 2-3 days.   Return immediately back to the ER if:  Your symptoms worsen within the next 12-24 hours. You develop new symptoms such as new fevers, persistent vomiting, new pain, shortness of breath, or new weakness or numbness, or if you have any other concerns.  

## 2021-07-13 ENCOUNTER — Telehealth: Payer: Self-pay

## 2021-07-13 NOTE — Telephone Encounter (Signed)
Transition Care Management Unsuccessful Follow-up Telephone Call ? ?Date of discharge and from where:  07/12/2021-[place]  ? ?Attempts:  1st Attempt ? ?Reason for unsuccessful TCM follow-up call:  Unable to reach patient ? ?  ?

## 2021-07-14 NOTE — Telephone Encounter (Signed)
Transition Care Management Unsuccessful Follow-up Telephone Call ? ?Date of discharge and from where:  07/12/2021-[facility]  ?  ? ?Attempts:  2nd Attempt ? ?Reason for unsuccessful TCM follow-up call:  Unable to reach patient ? ? ? ?

## 2021-07-18 NOTE — Telephone Encounter (Signed)
Transition Care Management Unsuccessful Follow-up Telephone Call ? ?Date of discharge and from where:  07/12/2021-[place]  ? ?Attempts:  3rd Attempt ? ?Reason for unsuccessful TCM follow-up call:  Unable to reach patient ? ?  ?

## 2021-07-29 ENCOUNTER — Emergency Department (HOSPITAL_COMMUNITY)
Admission: EM | Admit: 2021-07-29 | Discharge: 2021-07-29 | Disposition: A | Discharge status: Home / Self Care | Payer: Medicaid Other | Attending: Emergency Medicine | Admitting: Emergency Medicine

## 2021-07-29 ENCOUNTER — Emergency Department (HOSPITAL_COMMUNITY): Payer: Medicaid Other

## 2021-07-29 ENCOUNTER — Encounter (HOSPITAL_COMMUNITY): Payer: Self-pay | Admitting: Emergency Medicine

## 2021-07-29 ENCOUNTER — Other Ambulatory Visit: Payer: Self-pay

## 2021-07-29 DIAGNOSIS — R739 Hyperglycemia, unspecified: Secondary | ICD-10-CM | POA: Diagnosis not present

## 2021-07-29 DIAGNOSIS — R0602 Shortness of breath: Secondary | ICD-10-CM | POA: Diagnosis not present

## 2021-07-29 DIAGNOSIS — R109 Unspecified abdominal pain: Secondary | ICD-10-CM | POA: Insufficient documentation

## 2021-07-29 DIAGNOSIS — F1910 Other psychoactive substance abuse, uncomplicated: Secondary | ICD-10-CM | POA: Diagnosis not present

## 2021-07-29 DIAGNOSIS — K5900 Constipation, unspecified: Secondary | ICD-10-CM | POA: Insufficient documentation

## 2021-07-29 DIAGNOSIS — I10 Essential (primary) hypertension: Secondary | ICD-10-CM

## 2021-07-29 DIAGNOSIS — R42 Dizziness and giddiness: Secondary | ICD-10-CM | POA: Insufficient documentation

## 2021-07-29 DIAGNOSIS — R5383 Other fatigue: Secondary | ICD-10-CM | POA: Diagnosis not present

## 2021-07-29 DIAGNOSIS — E1165 Type 2 diabetes mellitus with hyperglycemia: Secondary | ICD-10-CM | POA: Diagnosis not present

## 2021-07-29 DIAGNOSIS — Z79899 Other long term (current) drug therapy: Secondary | ICD-10-CM | POA: Insufficient documentation

## 2021-07-29 DIAGNOSIS — R079 Chest pain, unspecified: Secondary | ICD-10-CM | POA: Diagnosis not present

## 2021-07-29 DIAGNOSIS — R000 Tachycardia, unspecified: Secondary | ICD-10-CM | POA: Diagnosis not present

## 2021-07-29 LAB — CBC WITH DIFFERENTIAL/PLATELET
Abs Immature Granulocytes: 0.03 10*3/uL (ref 0.00–0.07)
Basophils Absolute: 0 10*3/uL (ref 0.0–0.1)
Basophils Relative: 0 %
Eosinophils Absolute: 0.4 10*3/uL (ref 0.0–0.5)
Eosinophils Relative: 5 %
HCT: 39.6 % (ref 39.0–52.0)
Hemoglobin: 12.9 g/dL — ABNORMAL LOW (ref 13.0–17.0)
Immature Granulocytes: 0 %
Lymphocytes Relative: 18 %
Lymphs Abs: 1.5 10*3/uL (ref 0.7–4.0)
MCH: 29.1 pg (ref 26.0–34.0)
MCHC: 32.6 g/dL (ref 30.0–36.0)
MCV: 89.4 fL (ref 80.0–100.0)
Monocytes Absolute: 0.9 10*3/uL (ref 0.1–1.0)
Monocytes Relative: 10 %
Neutro Abs: 5.8 10*3/uL (ref 1.7–7.7)
Neutrophils Relative %: 67 %
Platelets: 223 10*3/uL (ref 150–400)
RBC: 4.43 MIL/uL (ref 4.22–5.81)
RDW: 13 % (ref 11.5–15.5)
WBC: 8.6 10*3/uL (ref 4.0–10.5)
nRBC: 0 % (ref 0.0–0.2)

## 2021-07-29 LAB — RAPID URINE DRUG SCREEN, HOSP PERFORMED
Amphetamines: NOT DETECTED
Barbiturates: NOT DETECTED
Benzodiazepines: NOT DETECTED
Cocaine: POSITIVE — AB
Opiates: NOT DETECTED
Tetrahydrocannabinol: NOT DETECTED

## 2021-07-29 LAB — COMPREHENSIVE METABOLIC PANEL
ALT: 28 U/L (ref 0–44)
AST: 40 U/L (ref 15–41)
Albumin: 4.2 g/dL (ref 3.5–5.0)
Alkaline Phosphatase: 70 U/L (ref 38–126)
Anion gap: 8 (ref 5–15)
BUN: 13 mg/dL (ref 6–20)
CO2: 26 mmol/L (ref 22–32)
Calcium: 8.4 mg/dL — ABNORMAL LOW (ref 8.9–10.3)
Chloride: 103 mmol/L (ref 98–111)
Creatinine, Ser: 0.89 mg/dL (ref 0.61–1.24)
GFR, Estimated: >60 mL/min (ref >60)
Glucose, Bld: 140 mg/dL — ABNORMAL HIGH (ref 70–99)
Potassium: 3.3 mmol/L — ABNORMAL LOW (ref 3.5–5.1)
Sodium: 137 mmol/L (ref 135–145)
Total Bilirubin: 0.6 mg/dL (ref 0.3–1.2)
Total Protein: 7.7 g/dL (ref 6.5–8.1)

## 2021-07-29 LAB — URINALYSIS, ROUTINE W REFLEX MICROSCOPIC
Bacteria, UA: NONE SEEN
Bilirubin Urine: NEGATIVE
Glucose, UA: NEGATIVE mg/dL
Ketones, ur: NEGATIVE mg/dL
Leukocytes,Ua: NEGATIVE
Nitrite: POSITIVE — AB
Protein, ur: NEGATIVE mg/dL
Specific Gravity, Urine: 1.009 (ref 1.005–1.030)
pH: 7 (ref 5.0–8.0)

## 2021-07-29 LAB — HEMOGLOBIN A1C
Hgb A1c MFr Bld: 5.9 % — ABNORMAL HIGH (ref 4.8–5.6)
Mean Plasma Glucose: 122.63 mg/dL

## 2021-07-29 LAB — CBG MONITORING, ED: Glucose-Capillary: 154 mg/dL — ABNORMAL HIGH (ref 70–99)

## 2021-07-29 LAB — TROPONIN I (HIGH SENSITIVITY): Troponin I (High Sensitivity): 6 ng/L (ref <18)

## 2021-07-29 LAB — BRAIN NATRIURETIC PEPTIDE: B Natriuretic Peptide: 42 pg/mL (ref 0.0–100.0)

## 2021-07-29 MED ORDER — POLYETHYLENE GLYCOL 3350 17 G PO PACK
17.0000 g | PACK | Freq: Once | ORAL | Status: AC
  Administered 2021-07-29: 17 g via ORAL
  Filled 2021-07-29: qty 1

## 2021-07-29 MED ORDER — METOPROLOL TARTRATE 50 MG PO TABS
50.0000 mg | ORAL_TABLET | Freq: Once | ORAL | Status: AC
  Administered 2021-07-29: 50 mg via ORAL
  Filled 2021-07-29: qty 1

## 2021-07-29 MED ORDER — AMLODIPINE BESYLATE 5 MG PO TABS: 5.0000 mg | ORAL_TABLET | Freq: Every day | ORAL | 0 refills | Status: AC

## 2021-07-29 MED ORDER — AMLODIPINE BESYLATE 5 MG PO TABS
10.0000 mg | ORAL_TABLET | Freq: Once | ORAL | Status: AC
  Administered 2021-07-29: 10 mg via ORAL
  Filled 2021-07-29: qty 2

## 2021-07-29 NOTE — ED Provider Notes (Signed)
[facility] Provider Note   CSN: [id number] Arrival date & time: 07/29/21  [id number]     History  Chief Complaint  Patient presents with   Constipation    Benjamin Vaughn. is a 51 y.o. male.   Constipation  This patient is a 51 year old male, he has multiple medical problems including known hypertension for which she is supposed to be taking amlodipine, he has chronic pain for which she was on 15 mg of oxycodone every 4 hours, he was followed by Dr. Cindie Laroche before retirement.  He states he has not had any of his medications in several months possibly 5 to 6 months, he reports that over the last 2 weeks he has had some night sweats, he has had some increasing fatigue over the last 3 days with associated foot pain and notes that he has not had a bowel movement in 5 days.  He is feeling generally lightheaded, he denies chest pain coughing or shortness of breath with exertion though he states he occasionally feels shortness of breath when he lays down at night.  He denies tobacco use in 30 years, denies alcohol use, he does endorse having heroin which she has been using for pain since he has not had his pain doctor and states he last used within the last week or 2.  The patient does smoke marijuana regularly.  Home Medications Prior to Admission medications   Medication Sig Start Date End Date Taking? Authorizing Provider  amLODipine (NORVASC) 5 MG tablet Take 1 tablet (5 mg total) by mouth daily. 07/29/21   Noemi Chapel, MD  clindamycin (CLEOCIN) 150 MG capsule Take 3 capsules (450 mg total) by mouth 3 (three) times daily. 04/04/21   Sherrell Puller, PA-C  cyclobenzaprine (FLEXERIL) 10 MG tablet Take 0.5-1 tablets (5-10 mg total) by mouth 2 (two) times daily as needed for muscle spasms. 01/13/21   Margarita Mail, PA-C  cyclobenzaprine (FLEXERIL) 5 MG tablet Take 1 tablet (5 mg total) by mouth 3 (three) times daily as needed for muscle spasms. 10/10/20   McKenzie, Candee Furbish,  MD  diphenhydrAMINE (BENADRYL) 25 MG tablet Take 25 mg by mouth every 6 (six) hours as needed.    [provider]  Multiple Vitamin (MULTIVITAMIN WITH MINERALS) TABS tablet Take 1 tablet by mouth daily.    [provider]  naproxen (NAPROSYN) 375 MG tablet Take 1 tablet (375 mg total) by mouth 2 (two) times daily with a meal. 01/13/21   Harris, Abigail, PA-C  ondansetron (ZOFRAN ODT) 4 MG disintegrating tablet Take 1 tablet (4 mg total) by mouth every 8 (eight) hours as needed for nausea. 03/31/19   Noemi Chapel, MD  ondansetron (ZOFRAN ODT) 4 MG disintegrating tablet Take 1 tablet (4 mg total) by mouth every 8 (eight) hours as needed for nausea or vomiting. 09/23/20   McKenzie, Candee Furbish, MD  oxyCODONE (ROXICODONE) 15 MG immediate release tablet Take 1 tablet (15 mg total) by mouth every 4 (four) hours as needed for moderate pain or severe pain. 04/02/19   McKenzie, Candee Furbish, MD  Oxycodone HCl 10 MG TABS Take 10 mg by mouth 4 (four) times daily as needed. 09/21/20   [provider]  oxyCODONE-acetaminophen (PERCOCET) 5-325 MG tablet Take 1 tablet by mouth every 4 (four) hours as needed. 09/23/20   McKenzie, Candee Furbish, MD  permethrin (ELIMITE) 5 % cream Apply to affected area once 07/12/21   Luna Fuse, MD  [facility] HFA 108 [phone] Base) MCG/ACT  inhaler Inhale 1-2 puffs into the lungs every 6 (six) hours as needed for wheezing or shortness of breath.  04/29/18   [provider]  tamsulosin (FLOMAX) 0.4 MG CAPS capsule Take 1 capsule (0.4 mg total) by mouth daily. 09/23/20   McKenzie, Candee Furbish, MD      Allergies    Aspirin, Bactrim, Hydrocodone-acetaminophen, Nsaids, Sulfamethoxazole, Tylenol [acetaminophen], Vicodin [hydrocodone-acetaminophen], and Sulfamethoxazole-trimethoprim    Review of Systems   Review of Systems  Gastrointestinal:  Positive for constipation.  All other systems reviewed and are negative.  Physical Exam Updated Vital Signs BP (!) 135/92 (BP  Location: Left Arm)   Pulse (!) 51   Temp 98.3 F (36.8 C) (Oral)   Resp 14   Ht 1.803 m (5\' 11" )   Wt 65.8 kg   SpO2 98%   BMI 20.22 kg/m  Physical Exam Vitals and nursing note reviewed.  Constitutional:      General: He is not in acute distress.    Appearance: He is well-developed.  HENT:     Head: Normocephalic and atraumatic.     Nose: Nose normal.     Mouth/Throat:     Mouth: Mucous membranes are moist.     Pharynx: No oropharyngeal exudate.  Eyes:     General: No scleral icterus.       Right eye: No discharge.        Left eye: No discharge.     Conjunctiva/sclera: Conjunctivae normal.     Pupils: Pupils are equal, round, and reactive to light.  Neck:     Thyroid: No thyromegaly.     Vascular: No JVD.  Cardiovascular:     Rate and Rhythm: Regular rhythm. Tachycardia present.     Heart sounds: Normal heart sounds. No murmur heard.   No friction rub. No gallop.     Comments: Heart rate of approximately 100 to 105 bpm and what appears to be sinus tachycardia Pulmonary:     Effort: Pulmonary effort is normal. No respiratory distress.     Breath sounds: Rales present. No wheezing.     Comments: There are rales at the bases that clear with deep breathing and coughing, there is no respiratory distress and clear lung sounds otherwise Abdominal:     General: Bowel sounds are normal. There is no distension.     Palpations: Abdomen is soft. There is no mass.     Tenderness: There is abdominal tenderness.     Comments: Mild diffuse tenderness, no guarding, very soft abdomen, no pulsating mass  Musculoskeletal:        General: No tenderness. Normal range of motion.     Cervical back: Normal range of motion and neck supple.     Right lower leg: No edema.     Left lower leg: No edema.     Comments: There is no frank edema of either of his lower extremities, there is normal pulses at the feet bilaterally and normal capillary refill  Lymphadenopathy:     Cervical: No cervical  adenopathy.  Skin:    General: Skin is warm and dry.     Findings: No erythema or rash.  Neurological:     Mental Status: He is alert.     Coordination: Coordination normal.     Comments: He does have some hyperesthesias of the bilateral feet but has normal speech cranial nerves III through XII normal coordination.  Normal strength in all 4 extremities  Psychiatric:  Behavior: Behavior normal.    ED Results / Procedures / Treatments   Labs (all labs ordered are listed, but only abnormal results are displayed) Labs Reviewed  COMPREHENSIVE METABOLIC PANEL - Abnormal; Notable for the following components:      Result Value   Potassium 3.3 (*)    Glucose, Bld 140 (*)    Calcium 8.4 (*)    All other components within normal limits  CBC WITH DIFFERENTIAL/PLATELET - Abnormal; Notable for the following components:   Hemoglobin 12.9 (*)    All other components within normal limits  URINALYSIS, ROUTINE W REFLEX MICROSCOPIC - Abnormal; Notable for the following components:   Color, Urine AMBER (*)    Hgb urine dipstick SMALL (*)    Nitrite POSITIVE (*)    All other components within normal limits  RAPID URINE DRUG SCREEN, HOSP PERFORMED - Abnormal; Notable for the following components:   Cocaine POSITIVE (*)    All other components within normal limits  CBG MONITORING, ED - Abnormal; Notable for the following components:   Glucose-Capillary 154 (*)    All other components within normal limits  BRAIN NATRIURETIC PEPTIDE  HEMOGLOBIN A1C  TROPONIN I (HIGH SENSITIVITY)    EKG EKG Interpretation  Date/Time:  Saturday Jul 29 2021 07:17:28 EDT Ventricular Rate:  93 PR Interval:  139 QRS Duration: 90 QT Interval:  347 QTC Calculation: 432 R Axis:   -33 Text Interpretation: Sinus rhythm Left axis deviation RSR' in V1 or V2, probably normal variant Nonspecific T abnrm, anterolateral leads since last tracing no significant change Confirmed by Noemi Chapel [phone]) on 07/29/2021  7:20:50 AM  Radiology DG Chest 2 View  Result Date: 07/29/2021 CLINICAL DATA:  Shortness of breath and generalized pain. EXAM: CHEST - 2 VIEW COMPARISON:  12/13/2019 FINDINGS: The lungs are clear without focal pneumonia, edema, pneumothorax or pleural effusion. Interstitial markings are diffusely coarsened with chronic features. The cardiopericardial silhouette is within normal limits for size. Telemetry leads overlie the chest. IMPRESSION: No active cardiopulmonary disease. Electronically Signed   By: Misty Stanley M.D.   On: 07/29/2021 07:50    Procedures Procedures    Medications Ordered in ED Medications  amLODipine (NORVASC) tablet 10 mg (10 mg Oral Given 07/29/21 0720)  metoprolol tartrate (LOPRESSOR) tablet 50 mg (50 mg Oral Given 07/29/21 0720)  polyethylene glycol (MIRALAX / GLYCOLAX) packet 17 g (17 g Oral Given 07/29/21 0719)    ED Course/ Medical Decision Making/ A&P                           Medical Decision Making Amount and/or Complexity of Data Reviewed Labs: ordered. Radiology: ordered. ECG/medicine tests: ordered.  Risk Prescription drug management.   This patient presents to the ED for concern of multiple medical problems, he has multiple complaints including constipation fatigue possible leg swelling and leg pain at his feet, this involves an extensive number of treatment options, and is a complaint that carries with it a high risk of complications and morbidity.  The differential diagnosis includes severe hypertension measuring at 206/113, this could be related to electrolyte abnormalities, renal failure, new onset diabetes, would also consider dehydration, primary cardiac disease, constipation.   Co morbidities that complicate the patient evaluation  Severe uncontrolled hypertension Chronic pain syndrome Drug abuse endorsing heroin Constipation possibly from heroin   Additional history obtained:  Additional history obtained from electronic medical  record External records from outside source obtained and reviewed including multiple  prior ED visits, has been seen in the last month for possible scabies, untreated hypertension a month and a half ago, dental abscess several months ago, lower back pain last year, he did have an admission to the hospital in October 2021 for an acute kidney injury   Lab Tests:  I Ordered, and personally interpreted labs.  The pertinent results include: Urine drug screen which was positive for cocaine, metabolic panel which was reassuring, CBC which was reassuring, BNP which measured 42, troponin of 6 and a urinalysis which showed no signs of infection   Imaging Studies ordered:  I ordered imaging studies including 2 view chest x-ray I independently visualized and interpreted imaging which showed no signs of acute infiltrates or pneumothorax I agree with the radiologist interpretation   Cardiac Monitoring: / EKG:  The patient was maintained on a cardiac monitor.  I personally viewed and interpreted the cardiac monitored which showed an underlying rhythm of: Sinus tachycardia improving over time.  EKG shows normal sinus rhythm, no ST elevation or depression, incomplete right bundle branch block but otherwise unremarkable EKG.   Consultations Obtained:  There is no indication for consultation.   Problem List / ED Course / Critical interventions / Medication management  High blood pressure, the patient was given medications I ordered medication including amlodipine for hypertension Reevaluation of the patient after these medicines showed that the patient improved with regards to blood pressure I have reviewed the patients home medicines and have made adjustments as needed   Social Determinants of Health:  Drug abuse, the patient was counseled on his use of cocaine, at the time of repeat evaluation the patient became very irate when I discussed his use of drugs.  He called me a "pussy faggot mother  fucker" -and continued to be rate me with language.  I told the patient that he would be discharged and that we would not tolerate that, behavior.  He became confrontational and violent.  He will be discharged immediately.  I do not see any conditions that would require admission or further stabilizing care and as the patient is becoming a danger to others around him he will be escorted off the property   Test / Admission - Considered:  The patient was not considered for admission to the hospital as he had improved symptoms, improve blood pressure and no decompensation including his neurologic status.         Final Clinical Impression(s) / ED Diagnoses Final diagnoses:  Polysubstance abuse ([place])  Hyperglycemia  Primary hypertension    Rx / DC Orders ED Discharge Orders          Ordered    amLODipine (NORVASC) 5 MG tablet  Daily        07/29/21 0907              Noemi Chapel, MD 07/29/21 [phone]

## 2021-07-29 NOTE — ED Triage Notes (Signed)
Pt c/o generalized pain and constipation x 4 days. Pt also c/o bilateral knee and foot swelling and pain.

## 2021-07-29 NOTE — ED Notes (Signed)
EDP at BS 

## 2021-07-29 NOTE — ED Notes (Signed)
Security called to escort pt out for D/C

## 2021-07-29 NOTE — ED Notes (Signed)
Pt given D/C paperwork; This RN stated he has BP medicine to pick up from pharmacy and pt stated "I don't want that shit, I don't need BP medicine" and threw the discharge paperwork on the bed and left without signing. Security at bedside

## 2021-07-29 NOTE — ED Notes (Signed)
Pt informed of need for urine sample, also informed of the use of an in-and-out if he cannot urinate

## 2021-07-29 NOTE — Discharge Instructions (Signed)
Your testing shows that you have been using cocaine which can cause a lot of your symptoms including high blood pressure.  Please avoid using drugs, I have represcribed your blood pressure medication to your pharmacy, take it once a day.  Please see the list below for family doctors in the area  [facility] Doctor List    Syliva Overman, MD. Specialty: [facility] Medicine Contact information: [address]  [place] [address]  [phone]   Lilyan Punt, MD. Specialty: [facility] Medicine Contact information: [address]  [place] [address]  [phone]   Avon Gully, MD Specialty: Internal Medicine Contact information: [address] [place] [address]  [phone]   Catalina Pizza, MD. Specialty: Internal Medicine Contact information: [address]  [place] [address]  [phone]    [facility] (Dr. Selena Batten) Specialty: Family Medicine Contact information: [address]  [place] [address]  [phone]   John Giovanni, MD. Specialty: [facility] Medicine Contact information: [address]  [address]  [place] [address]  [phone]   Carylon Perches, MD. Specialty: Internal Medicine Contact information: [address]  [address]  [place] [address]  [phone]    [facility]  [address] [place], [place] [address] [phone]  Services [facility] offers a variety of basic health services.  Services include but are not limited to: Blood pressure checks  Heart rate checks  Blood sugar checks  Urine analysis  Rapid strep tests  Pregnancy tests.  Health education and referrals  People needing more complex services will be directed to a physician online. Using these virtual visits, doctors can evaluate and prescribe medicine and treatments. There will be no medication on-site, though [facility] will help  patients fill their prescriptions at little to no cost.   For More information please go to: [email or web address]

## 2021-08-01 ENCOUNTER — Telehealth: Payer: Self-pay

## 2021-08-01 NOTE — Telephone Encounter (Signed)
Transition Care Management Unsuccessful Follow-up Telephone Call  Date of discharge and from where:  07/29/2021 from [facility]  Attempts:  1st Attempt  Reason for unsuccessful TCM follow-up call:  No answer/busy

## 2021-08-02 NOTE — Telephone Encounter (Signed)
Transition Care Management Unsuccessful Follow-up Telephone Call  Date of discharge and from where:  07/29/2021 from [facility]  Attempts:  2nd Attempt  Reason for unsuccessful TCM follow-up call:  No answer/busy

## 2021-08-03 NOTE — Telephone Encounter (Signed)
Transition Care Management Unsuccessful Follow-up Telephone Call  Date of discharge and from where:  07/29/2021-[facility]  Attempts:  3rd Attempt  Reason for unsuccessful TCM follow-up call:  No answer/busy

## 2021-08-24 ENCOUNTER — Other Ambulatory Visit: Payer: Self-pay

## 2021-08-24 ENCOUNTER — Encounter (HOSPITAL_COMMUNITY): Payer: Self-pay

## 2021-08-24 ENCOUNTER — Emergency Department (HOSPITAL_COMMUNITY)
Admission: EM | Admit: 2021-08-24 | Discharge: 2021-08-24 | Discharge status: Against Medical Advice | Payer: Medicaid Other | Attending: Emergency Medicine | Admitting: Emergency Medicine

## 2021-08-24 DIAGNOSIS — R202 Paresthesia of skin: Secondary | ICD-10-CM | POA: Diagnosis present

## 2021-08-24 NOTE — ED Triage Notes (Signed)
Pt presents with skin irritation that started a few days ago. States seen for same and always sent home. Pt with superficial bumps to arms and around mouth.

## 2021-08-24 NOTE — ED Provider Notes (Signed)
[facility] EMERGENCY DEPARTMENT Provider Note   CSN: [id number] Arrival date & time: 08/24/21  2113     History  No chief complaint on file.   Benjamin R Abid Bolla. is a 51 y.o. male.  Patient presents ER chief complaint of skin bumps.  He states that the hairs that he sees on his skin are not normal, and that he has "thousands of bugs."  He states symptoms been going on for months.  Denies any significant change.  No fever no cough no vomiting or diarrhea.  He is hesitant to answer when asked him if he has used any drugs.       Home Medications Prior to Admission medications   Medication Sig Start Date End Date Taking? Authorizing Provider  amLODipine (NORVASC) 5 MG tablet Take 1 tablet (5 mg total) by mouth daily. 07/29/21   Eber Hridhaan Yohn, MD  clindamycin (CLEOCIN) 150 MG capsule Take 3 capsules (450 mg total) by mouth 3 (three) times daily. 04/04/21   Achille Rich, PA-C  cyclobenzaprine (FLEXERIL) 10 MG tablet Take 0.5-1 tablets (5-10 mg total) by mouth 2 (two) times daily as needed for muscle spasms. 01/13/21   Arthor Captain, PA-C  cyclobenzaprine (FLEXERIL) 5 MG tablet Take 1 tablet (5 mg total) by mouth 3 (three) times daily as needed for muscle spasms. 10/10/20   McKenzie, Mardene Celeste, MD  diphenhydrAMINE (BENADRYL) 25 MG tablet Take 25 mg by mouth every 6 (six) hours as needed.    [provider]  Multiple Vitamin (MULTIVITAMIN WITH MINERALS) TABS tablet Take 1 tablet by mouth daily.    [provider]  naproxen (NAPROSYN) 375 MG tablet Take 1 tablet (375 mg total) by mouth 2 (two) times daily with a meal. 01/13/21   Harris, Abigail, PA-C  ondansetron (ZOFRAN ODT) 4 MG disintegrating tablet Take 1 tablet (4 mg total) by mouth every 8 (eight) hours as needed for nausea. 03/31/19   Eber Goldy Calandra, MD  ondansetron (ZOFRAN ODT) 4 MG disintegrating tablet Take 1 tablet (4 mg total) by mouth every 8 (eight) hours as needed for nausea or vomiting. 09/23/20   McKenzie,  Mardene Celeste, MD  oxyCODONE (ROXICODONE) 15 MG immediate release tablet Take 1 tablet (15 mg total) by mouth every 4 (four) hours as needed for moderate pain or severe pain. 04/02/19   McKenzie, Mardene Celeste, MD  Oxycodone HCl 10 MG TABS Take 10 mg by mouth 4 (four) times daily as needed. 09/21/20   [provider]  oxyCODONE-acetaminophen (PERCOCET) 5-325 MG tablet Take 1 tablet by mouth every 4 (four) hours as needed. 09/23/20   McKenzie, Mardene Celeste, MD  permethrin (ELIMITE) 5 % cream Apply to affected area once 07/12/21   Cheryll Cockayne, MD  [facility] HFA 108 [phone] Base) MCG/ACT inhaler Inhale 1-2 puffs into the lungs every 6 (six) hours as needed for wheezing or shortness of breath.  04/29/18   [provider]  tamsulosin (FLOMAX) 0.4 MG CAPS capsule Take 1 capsule (0.4 mg total) by mouth daily. 09/23/20   McKenzie, Mardene Celeste, MD      Allergies    Aspirin, Bactrim, Hydrocodone-acetaminophen, Nsaids, Sulfamethoxazole, Tylenol [acetaminophen], Vicodin [hydrocodone-acetaminophen], and Sulfamethoxazole-trimethoprim    Review of Systems   Review of Systems  Constitutional:  Negative for fever.  HENT:  Negative for ear pain and sore throat.   Eyes:  Negative for pain.  Respiratory:  Negative for cough.   Cardiovascular:  Negative for chest pain.  Gastrointestinal:  Negative for abdominal pain.  Genitourinary:  Negative for flank pain.  Musculoskeletal:  Negative for back pain.  Skin:  Positive for rash. Negative for color change.  Neurological:  Negative for syncope.  All other systems reviewed and are negative.   Physical Exam Updated Vital Signs BP (!) 181/108 (BP Location: Right Arm)   Pulse (!) 112   Temp 98.4 F (36.9 C) (Oral)   Resp 19   Ht 5\' 11"  (1.803 m)   Wt 65.8 kg   SpO2 100%   BMI 20.22 kg/m  Physical Exam Constitutional:      Appearance: He is well-developed.  HENT:     Head: Normocephalic.     Nose: Nose normal.  Eyes:     Extraocular Movements: Extraocular  movements intact.  Cardiovascular:     Rate and Rhythm: Normal rate.  Pulmonary:     Effort: Pulmonary effort is normal.  Skin:    Coloration: Skin is not jaundiced.     Comments: No visible rash or lesion seen on skin no excoriations noted.  Neurological:     Mental Status: He is alert. Mental status is at baseline.     ED Results / Procedures / Treatments   Labs (all labs ordered are listed, but only abnormal results are displayed) Labs Reviewed - No data to display  EKG None  Radiology No results found.  Procedures Procedures    Medications Ordered in ED Medications - No data to display  ED Course/ Medical Decision Making/ A&P                           Medical Decision Making  Patient points to nonexistent skin lesions appears confined something is going on.  No evidence of lesions noted on clinical exam.  We will recommend outpatient follow-up with his doctor within the week.  Recommend return if he has fevers worsening symptoms or any additional concerns.        Final Clinical Impression(s) / ED Diagnoses Final diagnoses:  Formication    Rx / DC Orders ED Discharge Orders     None         , MD 08/24/21 2259

## 2021-08-25 ENCOUNTER — Other Ambulatory Visit: Payer: Medicaid Other | Admitting: Obstetrics and Gynecology

## 2021-08-25 NOTE — Patient Outreach (Signed)
Transition Care Management Unsuccessful Follow-up Telephone Call  Date of discharge and from where:  08/24/21 [facility]  Attempts:  1st Attempt  Reason for unsuccessful TCM follow-up call:  No answer/busy  Kathi Der RN, BSN [place]  [facility] Care Management Coordinator - Managed Medicaid High Risk [phone]

## 2021-08-29 ENCOUNTER — Telehealth: Payer: Self-pay | Admitting: *Deleted

## 2021-08-29 NOTE — Patient Outreach (Signed)
Care Coordination  08/29/2021  Quin Aborn Mar 14, 1970 [id number]  Transition Care Management Unsuccessful Follow-up Telephone Call  Date of discharge and from where:  08/24/21, [facility]  Attempts:  2nd Attempt  Reason for unsuccessful TCM follow-up call:  Unable to leave message   Estanislado Emms RN, BSN [place]  [facility] RN Care Coordinator

## 2021-08-31 ENCOUNTER — Telehealth: Payer: Self-pay | Admitting: *Deleted

## 2021-08-31 NOTE — Patient Outreach (Signed)
Care Coordination  08/31/2021  Doy Taaffe [birth year] [id number]  Transition Care Management Unsuccessful Follow-up Telephone Call  Date of discharge and from where:  08/24/21, [facility]  Attempts:  3rd Attempt  Reason for unsuccessful TCM follow-up call:  No answer/busy  Estanislado Emms RN, BSN [place]  [facility] RN Care Coordinator

## 2021-11-18 ENCOUNTER — Emergency Department (HOSPITAL_COMMUNITY)
Admission: EM | Admit: 2021-11-18 | Discharge: 2021-11-18 | Disposition: A | Discharge status: Home / Self Care | Payer: Medicaid Other | Attending: Emergency Medicine | Admitting: Emergency Medicine

## 2021-11-18 ENCOUNTER — Other Ambulatory Visit: Payer: Self-pay

## 2021-11-18 ENCOUNTER — Encounter (HOSPITAL_COMMUNITY): Payer: Self-pay | Admitting: *Deleted

## 2021-11-18 DIAGNOSIS — Z23 Encounter for immunization: Secondary | ICD-10-CM | POA: Diagnosis not present

## 2021-11-18 DIAGNOSIS — L0201 Cutaneous abscess of face: Secondary | ICD-10-CM | POA: Diagnosis not present

## 2021-11-18 DIAGNOSIS — R220 Localized swelling, mass and lump, head: Secondary | ICD-10-CM | POA: Diagnosis present

## 2021-11-18 DIAGNOSIS — H00015 Hordeolum externum left lower eyelid: Secondary | ICD-10-CM | POA: Insufficient documentation

## 2021-11-18 MED ORDER — KETOROLAC TROMETHAMINE 30 MG/ML IJ SOLN
30.0000 mg | Freq: Once | INTRAMUSCULAR | Status: DC
Start: 2021-11-18 — End: 2021-11-18
  Filled 2021-11-18: qty 1

## 2021-11-18 MED ORDER — DOXYCYCLINE HYCLATE 100 MG PO CAPS: 100.0000 mg | ORAL_CAPSULE | Freq: Two times a day (BID) | ORAL | 0 refills | Status: AC

## 2021-11-18 MED ORDER — DOXYCYCLINE HYCLATE 100 MG PO TABS
100.0000 mg | ORAL_TABLET | Freq: Once | ORAL | Status: AC
  Administered 2021-11-18: 100 mg via ORAL
  Filled 2021-11-18: qty 1

## 2021-11-18 MED ORDER — LIDOCAINE HCL (PF) 1 % IJ SOLN
5.0000 mL | Freq: Once | INTRAMUSCULAR | Status: AC
  Administered 2021-11-18: 5 mL
  Filled 2021-11-18: qty 5

## 2021-11-18 MED ORDER — TETANUS-DIPHTH-ACELL PERTUSSIS 5-2.5-18.5 LF-MCG/0.5 IM SUSY
0.5000 mL | PREFILLED_SYRINGE | Freq: Once | INTRAMUSCULAR | Status: AC
Start: 2021-11-18 — End: 2021-11-18
  Administered 2021-11-18: 0.5 mL via INTRAMUSCULAR
  Filled 2021-11-18: qty 0.5

## 2021-11-18 NOTE — ED Triage Notes (Signed)
Pt with left upper lip, left eye with swelling. Denies any trouble breathing, states throat is a little scratchy. Also with c/o cyst to scalp.

## 2021-11-18 NOTE — ED Notes (Signed)
Patient refused ordered toradol for pain, grabbed dc papers and left before signing dc paperwork, PA made aware

## 2021-11-18 NOTE — ED Provider Notes (Signed)
[facility] EMERGENCY DEPARTMENT Provider Note   CSN: [id number] Arrival date & time: 11/18/21  1138     History  No chief complaint on file.   Benjamin Vaughn. is a 51 y.o. male.  HPI   Medical history including hypertension, MRSA infections, presents with complaints of swelling to the left face as well as wound to the head.  Patient states that swelling to the left of the face started about 5 days ago, states initially started with his left lip and then he noticed onto his left eye, he states that the areas are not itchy, they are hurts, there is been no traumatic injury associated, he has no tongue throat lip swelling difficulty breathing, no systemic hives, no GI symptoms, no new medication changes, no environmental changes, never had this in the past.  He also notes that he has an ulcer on his head, states has had this over a month, unclear what it is, no drainage or discharge from the area no increasing pain no other complaints.    Home Medications Prior to Admission medications   Medication Sig Start Date End Date Taking? Authorizing Provider  amLODipine (NORVASC) 5 MG tablet Take 1 tablet (5 mg total) by mouth daily. 07/29/21  Yes Noemi Chapel, MD  diphenhydrAMINE (BENADRYL) 25 MG tablet Take 25 mg by mouth every 6 (six) hours as needed for allergies.   Yes [provider]  doxycycline (VIBRAMYCIN) 100 MG capsule Take 1 capsule (100 mg total) by mouth 2 (two) times daily for 7 days. 11/18/21 11/25/21 Yes Marcello Fennel PA-C  PROAIR HFA 108 [phone] Base) MCG/ACT inhaler Inhale 1-2 puffs into the lungs every 6 (six) hours as needed for wheezing or shortness of breath.  04/29/18  Yes [provider]  tamsulosin (FLOMAX) 0.4 MG CAPS capsule Take 1 capsule (0.4 mg total) by mouth daily. 09/23/20  Yes McKenzie, Candee Furbish, MD      Allergies    Aspirin, Bactrim, Hydrocodone-acetaminophen, Nsaids, Sulfamethoxazole, Tylenol [acetaminophen], Vicodin  [hydrocodone-acetaminophen], and Sulfamethoxazole-trimethoprim    Review of Systems   Review of Systems  Constitutional:  Negative for chills and fever.  Respiratory:  Negative for shortness of breath.   Cardiovascular:  Negative for chest pain.  Gastrointestinal:  Negative for abdominal pain.  Skin:  Positive for wound.  Neurological:  Negative for headaches.    Physical Exam Updated Vital Signs BP (!) 185/117 (BP Location: Right Arm)   Pulse 66   Temp 98.4 F (36.9 C)   Resp 19   Ht 5\' 11"  (1.803 m)   Wt 65.8 kg   SpO2 100%   BMI 20.22 kg/m  Physical Exam Vitals and nursing note reviewed.  Constitutional:      General: He is not in acute distress.    Appearance: He is not ill-appearing.  HENT:     Head: Normocephalic and atraumatic.     Comments: Patient has papules noted on his scalp, with overlying scabs, no surrounding erythema, no drainage or discharge present, slightly tender to the touch, no fluctuance or induration noted.    Nose: No congestion.     Mouth/Throat:     Mouth: Mucous membranes are moist.     Pharynx: Oropharynx is clear. No oropharyngeal exudate or posterior oropharyngeal erythema.     Comments: No trismus no torticollis, there is some swelling noted along the left upper lip, there is a noted pustule on the middle of his left lip which is warm to the touch erythematous,  no drainage or discharge present, area was tender to palpation, there is no swelling of the tongue or throat, tonsils were equal symmetric bilaterally, controlling oral secretions, no submandibular swelling.  Patient poor dental hygiene, with multiple teeth in various stages of decay no evidence of gingivitis. Eyes:     Extraocular Movements: Extraocular movements intact.     Conjunctiva/sclera: Conjunctivae normal.     Pupils: Pupils are equal, round, and reactive to light.     Comments: Patient has slight edema beneath the left lower eyelid, without erythema or edema, no proptosis  present, at the corner of his left lower eyelid on the temporal side there is a stye present, area is nontender to palpation, PERRLA, EOMs intact, no scleral injection.  Cardiovascular:     Rate and Rhythm: Normal rate and regular rhythm.     Pulses: Normal pulses.     Heart sounds: No murmur heard.    No friction rub. No gallop.  Pulmonary:     Effort: Pulmonary effort is normal.  Skin:    General: Skin is warm and dry.     Comments: No systemic rash present, none noted in the palms or soles of the feet.  Neurological:     Mental Status: He is alert.  Psychiatric:        Mood and Affect: Mood normal.     ED Results / Procedures / Treatments   Labs (all labs ordered are listed, but only abnormal results are displayed) Labs Reviewed - No data to display  EKG None  Radiology No results found.  Procedures .Marland KitchenIncision and Drainage  Date/Time: 11/18/2021 4:31 PM  Performed by: Carroll Sage, PA-C Authorized by: Carroll Sage, PA-C   Consent:    Consent obtained:  Verbal   Consent given by:  Patient   Risks, benefits, and alternatives were discussed: yes     Risks discussed:  Bleeding, damage to other organs, incomplete drainage, infection and pain   Alternatives discussed:  No treatment, delayed treatment, alternative treatment, observation and referral Location:    Type:  Abscess   Size:  1cm   Location:  Head   Head location:  Face Pre-procedure details:    Skin preparation:  Antiseptic wash Sedation:    Sedation type:  None Anesthesia:    Anesthesia method:  Local infiltration   Local anesthetic:  Lidocaine 1% w/o epi Procedure type:    Complexity:  Simple Procedure details:    Ultrasound guidance: no     Needle aspiration: no     Incision types:  Stab incision   Incision depth:  Subcutaneous   Drainage:  Bloody and purulent   Drainage amount:  Moderate   Wound treatment:  Wound left open   Packing materials:  None Post-procedure details:     Procedure completion:  Tolerated well, no immediate complications     Medications Ordered in ED Medications  doxycycline (VIBRA-TABS) tablet 100 mg (has no administration in time range)  ketorolac (TORADOL) 30 MG/ML injection 30 mg (has no administration in time range)  lidocaine (PF) (XYLOCAINE) 1 % injection 5 mL (5 mLs Infiltration Given 11/18/21 1536)  Tdap (BOOSTRIX) injection 0.5 mL (0.5 mLs Intramuscular Given 11/18/21 1535)    ED Course/ Medical Decision Making/ A&P                           Medical Decision Making Risk Prescription drug management.   This patient presents to the ED  for concern of facial swelling, this involves an extensive number of treatment options, and is a complaint that carries with it a high risk of complications and morbidity.  The differential diagnosis includes orbital/periorbital cellulitis, angioedema, anaphylaxis, cellulitis    Additional history obtained:  Additional history obtained from N/A External records from outside source obtained and reviewed including previous ED notes   Co morbidities that complicate the patient evaluation  Polysubstance dependency  Social Determinants of Health:  No PCP    Lab Tests:  I Ordered, and personally interpreted labs.  The pertinent results include: N/A   Imaging Studies ordered:  I ordered imaging studies including N/A I independently visualized and interpreted imaging which showed N/A I agree with the radiologist interpretation   Cardiac Monitoring:  The patient was maintained on a cardiac monitor.  I personally viewed and interpreted the cardiac monitored which showed an underlying rhythm of: N/A   Medicines ordered and prescription drug management:  I ordered medication including lidocaine I have reviewed the patients home medicines and have made adjustments as needed  Critical Interventions:  N/a   Reevaluation:  Presents with swelling of the left face, left upper eye  secondary due to stye, swelling of the left upper lip secondary due to an abscess, recommend I&D, agreement this plan, tolerated procedure well, patient agreement plan discharge at this time.  Consultations Obtained:  N/A    Test Considered:  CT maxillofacial-deferred as my specimen for orbital cellulitis is low at this time, there is no erythema or edema involving the eyelid itself, there is no proptosis, no pain with eye movement, presentation seems atypical etiology.    Rule out I have low suspicion for angioedema or anaphylaxis there is no oral involvement no tongue throat lip swelling difficulty breathing no change in voice, no GI symptoms, no systemic rash.  I have low suspicion for orbital cellulitis is low at this time, there is no erythema or edema involving the eyelid itself, there is no proptosis, no pain with eye movement, presentation seems atypical etiology. Low  Suspicion for tickborne illness as is no systemic rash, there is no rash on patient's palms or soles.    Dispostion and problem list  After consideration of the diagnostic results and the patients response to treatment, I feel that the patent would benefit from discharge.  Abscess-likely cause of patient's left upper lip swelling, I have I&D the area, will start him on antibiotics, due to his history of MRSA infections we will start him on doxycycline. Stye-likely the cause of patient's left right eye swelling, will recommend symptom management, follow-up with ophthalmology for further evaluation. Papules-patient is noted papules on his head, possible from itching the area, there is no evidence of infection, patient will be on antibiotics which will cover him for any possible underlying infection.  We will follow-up with PCP.            Final Clinical Impression(s) / ED Diagnoses Final diagnoses:  Hordeolum externum of left lower eyelid  Abscess of face    Rx / DC Orders ED Discharge Orders           Ordered    doxycycline (VIBRAMYCIN) 100 MG capsule  2 times daily        11/18/21 1639              Carroll Sage, PA-C 11/18/21 1641    Bethann Berkshire, MD 11/19/21 [phone]

## 2021-11-18 NOTE — Discharge Instructions (Signed)
Abscess-likely cause of your lip swelling,  starting antibiotics please take as prescribed, please apply warm compress to the area 2 times daily, follow with your PCP as needed. Left eye swelling-likely you have a stye, warm compress to the area, this will should resolve on its own, you may follow-up with ophthalmology for further evaluation.  Come back to the emergency department if you develop chest pain, shortness of breath, severe abdominal pain, uncontrolled nausea, vomiting, diarrhea.

## 2021-12-08 ENCOUNTER — Emergency Department (HOSPITAL_COMMUNITY)
Admission: EM | Admit: 2021-12-08 | Discharge: 2021-12-08 | Disposition: A | Discharge status: Home / Self Care | Payer: Medicaid Other | Attending: Emergency Medicine | Admitting: Emergency Medicine

## 2021-12-08 ENCOUNTER — Other Ambulatory Visit: Payer: Self-pay

## 2021-12-08 ENCOUNTER — Encounter (HOSPITAL_COMMUNITY): Payer: Self-pay | Admitting: *Deleted

## 2021-12-08 DIAGNOSIS — L02811 Cutaneous abscess of head [any part, except face]: Secondary | ICD-10-CM | POA: Insufficient documentation

## 2021-12-08 DIAGNOSIS — Z79899 Other long term (current) drug therapy: Secondary | ICD-10-CM | POA: Diagnosis not present

## 2021-12-08 DIAGNOSIS — R42 Dizziness and giddiness: Secondary | ICD-10-CM | POA: Insufficient documentation

## 2021-12-08 LAB — BASIC METABOLIC PANEL
Anion gap: 11 (ref 5–15)
BUN: 12 mg/dL (ref 6–20)
CO2: 29 mmol/L (ref 22–32)
Calcium: 9.7 mg/dL (ref 8.9–10.3)
Chloride: 100 mmol/L (ref 98–111)
Creatinine, Ser: 0.94 mg/dL (ref 0.61–1.24)
GFR, Estimated: >60 mL/min (ref >60)
Glucose, Bld: 108 mg/dL — ABNORMAL HIGH (ref 70–99)
Potassium: 3.4 mmol/L — ABNORMAL LOW (ref 3.5–5.1)
Sodium: 140 mmol/L (ref 135–145)

## 2021-12-08 LAB — CBC WITH DIFFERENTIAL/PLATELET
Abs Immature Granulocytes: 0.03 10*3/uL (ref 0.00–0.07)
Basophils Absolute: 0.1 10*3/uL (ref 0.0–0.1)
Basophils Relative: 1 %
Eosinophils Absolute: 0.2 10*3/uL (ref 0.0–0.5)
Eosinophils Relative: 2 %
HCT: 44.2 % (ref 39.0–52.0)
Hemoglobin: 14.8 g/dL (ref 13.0–17.0)
Immature Granulocytes: 0 %
Lymphocytes Relative: 24 %
Lymphs Abs: 2.4 10*3/uL (ref 0.7–4.0)
MCH: 29.7 pg (ref 26.0–34.0)
MCHC: 33.5 g/dL (ref 30.0–36.0)
MCV: 88.8 fL (ref 80.0–100.0)
Monocytes Absolute: 0.8 10*3/uL (ref 0.1–1.0)
Monocytes Relative: 8 %
Neutro Abs: 6.3 10*3/uL (ref 1.7–7.7)
Neutrophils Relative %: 65 %
Platelets: 379 10*3/uL (ref 150–400)
RBC: 4.98 MIL/uL (ref 4.22–5.81)
RDW: 13.4 % (ref 11.5–15.5)
WBC: 9.7 10*3/uL (ref 4.0–10.5)
nRBC: 0 % (ref 0.0–0.2)

## 2021-12-08 MED ORDER — POVIDONE-IODINE 10 % EX SOLN
CUTANEOUS | Status: DC | PRN
  Filled 2021-12-08: qty 14.8

## 2021-12-08 MED ORDER — LIDOCAINE-EPINEPHRINE (PF) 2 %-1:200000 IJ SOLN
10.0000 mL | Freq: Once | INTRAMUSCULAR | Status: AC
  Administered 2021-12-08: 10 mL
  Filled 2021-12-08: qty 20

## 2021-12-08 MED ORDER — POVIDONE-IODINE 5 % EX SOLN: Freq: Once | CUTANEOUS | Status: DC

## 2021-12-08 MED ORDER — DOXYCYCLINE HYCLATE 100 MG PO CAPS: 100.0000 mg | ORAL_CAPSULE | Freq: Two times a day (BID) | ORAL | 0 refills | Status: DC

## 2021-12-08 NOTE — Discharge Instructions (Addendum)
Soap and  water, warm compress, Tylenol as needed for pain. Finish antibiotics.

## 2021-12-08 NOTE — ED Provider Notes (Signed)
Benjamin Vaughn   CSN: [id number] Arrival date & time: 12/08/21  1014     History  Chief Complaint  Patient presents with   Abscess    Benjamin Bond Kylian Loh. is a 51 y.o. male.  He is here with a complaint of abscess to the top of his head that is been there for 3 to 4 days.  He also says he feels dizzy.  He does not think he has had a fever.  He does not have a regular doctor, blood pressure elevated here.  On review of prior visits blood pressures look to be elevated chronically.  He denies any current medications.  The history is provided by the patient.  Abscess Location:  Head/neck Head/neck abscess location:  Scalp Size:  3 Abscess quality: draining, induration and painful   Red streaking: no   Duration:  3 days Progression:  Worsening Pain details:    Quality:  Throbbing   Severity:  Moderate   Timing:  Constant   Progression:  Unchanged Chronicity:  New Relieved by:  None tried Worsened by:  Nothing Ineffective treatments:  None tried Associated symptoms: no fever, no nausea and no vomiting   Risk factors: prior abscess        Home Medications Prior to Admission medications   Medication Sig Start Date End Date Taking? Authorizing Provider  amLODipine (NORVASC) 5 MG tablet Take 1 tablet (5 mg total) by mouth daily. 07/29/21   Noemi Chapel, MD  diphenhydrAMINE (BENADRYL) 25 MG tablet Take 25 mg by mouth every 6 (six) hours as needed for allergies.    [provider]  PROAIR HFA 108 [phone] Base) MCG/ACT inhaler Inhale 1-2 puffs into the lungs every 6 (six) hours as needed for wheezing or shortness of breath.  04/29/18   [provider]  tamsulosin (FLOMAX) 0.4 MG CAPS capsule Take 1 capsule (0.4 mg total) by mouth daily. 09/23/20   McKenzie, Candee Furbish, MD      Allergies    Aspirin, Bactrim, Hydrocodone-acetaminophen, Nsaids, Sulfamethoxazole, Tylenol [acetaminophen], Vicodin [hydrocodone-acetaminophen], and  Sulfamethoxazole-trimethoprim    Review of Systems   Review of Systems  Constitutional:  Negative for fever.  Gastrointestinal:  Negative for nausea and vomiting.  Skin:  Positive for wound.  Neurological:  Positive for dizziness.    Physical Exam Updated Vital Signs BP (!) 174/114   Pulse 85   Temp 98.2 F (36.8 C) (Oral)   Resp 16   Ht 5\' 11"  (1.803 m)   Wt 65.8 kg   SpO2 96%   BMI 20.22 kg/m  Physical Exam Vitals and nursing Vaughn reviewed.  Constitutional:      Appearance: Normal appearance. He is well-developed. He is not ill-appearing.  HENT:     Head: Normocephalic and atraumatic.     Comments: He has approximately 3 cm crusted lesion on the top of his scalp with some purulent drainage. Eyes:     Conjunctiva/sclera: Conjunctivae normal.  Cardiovascular:     Rate and Rhythm: Normal rate and regular rhythm.  Pulmonary:     Effort: Pulmonary effort is normal.  Musculoskeletal:     Cervical back: Neck supple.  Skin:    General: Skin is warm and dry.  Neurological:     General: No focal deficit present.     Mental Status: He is alert.     GCS: GCS eye subscore is 4. GCS verbal subscore is 5. GCS motor subscore is 6.     ED  Results / Procedures / Treatments   Labs (all labs ordered are listed, but only abnormal results are displayed) Labs Reviewed  BASIC METABOLIC PANEL - Abnormal; Notable for the following components:      Result Value   Potassium 3.4 (*)    Glucose, Bld 108 (*)    All other components within normal limits  CBC WITH DIFFERENTIAL/PLATELET    EKG None  Radiology No results found.  Procedures Procedures    Medications Ordered in ED Medications  lidocaine-EPINEPHrine (XYLOCAINE W/EPI) 2 %-1:200000 (PF) injection 10 mL (has no administration in time range)    ED Course/ Medical Decision Making/ A&P                           Medical Decision Making Amount and/or Complexity of Data Reviewed Labs: ordered.  Risk OTC  drugs. Prescription drug management.   This patient complains of abscess scalp; this involves an extensive number of treatment Options and is a complaint that carries with it a high risk of complications and morbidity. The differential includes abscess, fungal infection, tumor  I ordered, reviewed and interpreted labs, which included CBC with normal white count, chemistry mildly low potassium Previous records obtained and reviewed in epic, multiple ED visits for substance use and various abscesses  Social determinants considered, no significant barriers Critical Interventions: None  After the interventions stated above, I reevaluated the patient and found patient to be nontoxic-appearing.  He is hypertensive. Admission and further testing considered, no indications for admission at this time.  Will cover with antibiotics and give contact information for ENT at his request.  Also recommended he establish care with primary care doctor as he may need some blood pressure medication.  Return instructions discussed         Final Clinical Impression(s) / ED Diagnoses Final diagnoses:  Scalp abscess    Rx / DC Orders ED Discharge Orders          Ordered    doxycycline (VIBRAMYCIN) 100 MG capsule  2 times daily        12/08/21 1326              Terrilee Files, MD 12/08/21 Benjamin Vaughn

## 2021-12-08 NOTE — ED Triage Notes (Signed)
Pt with abscess to head x 1 week.  Pt c/o dizziness for past 2 days. + emesis. Denies any fevers at home.

## 2022-02-16 ENCOUNTER — Other Ambulatory Visit: Payer: Self-pay

## 2022-02-16 DIAGNOSIS — M779 Enthesopathy, unspecified: Secondary | ICD-10-CM

## 2022-02-16 DIAGNOSIS — T148XXA Other injury of unspecified body region, initial encounter: Secondary | ICD-10-CM

## 2022-02-16 DIAGNOSIS — M479 Spondylosis, unspecified: Secondary | ICD-10-CM

## 2022-03-09 ENCOUNTER — Emergency Department (HOSPITAL_COMMUNITY)
Admission: EM | Admit: 2022-03-09 | Discharge: 2022-03-09 | Disposition: A | Discharge status: Home / Self Care | Payer: Medicaid Other | Attending: Emergency Medicine | Admitting: Emergency Medicine

## 2022-03-09 ENCOUNTER — Encounter (HOSPITAL_COMMUNITY): Payer: Self-pay | Admitting: *Deleted

## 2022-03-09 ENCOUNTER — Other Ambulatory Visit: Payer: Self-pay

## 2022-03-09 ENCOUNTER — Emergency Department (HOSPITAL_COMMUNITY): Payer: Medicaid Other

## 2022-03-09 DIAGNOSIS — I10 Essential (primary) hypertension: Secondary | ICD-10-CM | POA: Diagnosis not present

## 2022-03-09 DIAGNOSIS — Z79899 Other long term (current) drug therapy: Secondary | ICD-10-CM | POA: Diagnosis not present

## 2022-03-09 DIAGNOSIS — T50901A Poisoning by unspecified drugs, medicaments and biological substances, accidental (unintentional), initial encounter: Secondary | ICD-10-CM | POA: Insufficient documentation

## 2022-03-09 DIAGNOSIS — R030 Elevated blood-pressure reading, without diagnosis of hypertension: Secondary | ICD-10-CM

## 2022-03-09 LAB — COMPREHENSIVE METABOLIC PANEL
ALT: 19 U/L (ref 0–44)
AST: 25 U/L (ref 15–41)
Albumin: 4.4 g/dL (ref 3.5–5.0)
Alkaline Phosphatase: 73 U/L (ref 38–126)
Anion gap: 8 (ref 5–15)
BUN: 13 mg/dL (ref 6–20)
CO2: 29 mmol/L (ref 22–32)
Calcium: 9.2 mg/dL (ref 8.9–10.3)
Chloride: 102 mmol/L (ref 98–111)
Creatinine, Ser: 0.89 mg/dL (ref 0.61–1.24)
GFR, Estimated: >60 mL/min (ref >60)
Glucose, Bld: 135 mg/dL — ABNORMAL HIGH (ref 70–99)
Potassium: 5 mmol/L (ref 3.5–5.1)
Sodium: 139 mmol/L (ref 135–145)
Total Bilirubin: 0.7 mg/dL (ref 0.3–1.2)
Total Protein: 8.4 g/dL — ABNORMAL HIGH (ref 6.5–8.1)

## 2022-03-09 LAB — CBC
HCT: 44.3 % (ref 39.0–52.0)
Hemoglobin: 14.6 g/dL (ref 13.0–17.0)
MCH: 29.7 pg (ref 26.0–34.0)
MCHC: 33 g/dL (ref 30.0–36.0)
MCV: 90 fL (ref 80.0–100.0)
Platelets: 355 10*3/uL (ref 150–400)
RBC: 4.92 MIL/uL (ref 4.22–5.81)
RDW: 12.7 % (ref 11.5–15.5)
WBC: 11.2 10*3/uL — ABNORMAL HIGH (ref 4.0–10.5)
nRBC: 0 % (ref 0.0–0.2)

## 2022-03-09 LAB — ETHANOL: Alcohol, Ethyl (B): <10 mg/dL (ref <10)

## 2022-03-09 LAB — TROPONIN I (HIGH SENSITIVITY): Troponin I (High Sensitivity): 5 ng/L (ref <18)

## 2022-03-09 MED ORDER — AMLODIPINE BESYLATE 5 MG PO TABS: 5.0000 mg | ORAL_TABLET | Freq: Once | ORAL | Status: DC

## 2022-03-09 NOTE — ED Notes (Signed)
Pt already dressed and took off all monitoring equipment prior to discharge

## 2022-03-09 NOTE — ED Notes (Signed)
Pt reports he took something for pain, but doesn't know what it was. When asked where he got it, he stated "I had it". When asked how he took it, he initially said he didn't know, but I told him he was the only one who would know because he is the one who took it, then he said it was swallowed

## 2022-03-09 NOTE — Discharge Instructions (Addendum)
It was our pleasure to provide your ER care today - we hope that you feel better.  Avoid taking any medication other than as prescribed to you.  Also avoid drug use as it is harmful to your physical health and mental well-being. See resource guide attached in terms of accessing inpatient or outpatient substance use treatment programs.   Follow up closely with primary care doctor and behavioral health provider in the coming week.  For mental health issues and/or crisis, you may also go directly to the [facility] - they are open 24/7 and walk-ins are welcome.    Your blood pressure is high today - continue your home meds, limit salt intake, and follow up closely with primary care doctor in the coming week.   Return to ER if worse, new symptoms, fevers, chest pain, trouble breathing, or other emergency concern.

## 2022-03-09 NOTE — ED Provider Notes (Signed)
Patient with most likely substance abuse.  He has been observed for 6 hours and has not needed any more Narcan.  He is alert and oriented x 4.  Patient states he cannot get a urine specimen so we will just discharged him home to follow-up with his PCP   Milton Ferguson, MD 03/09/22 1956

## 2022-03-09 NOTE — ED Notes (Signed)
Pt given gingerale and graham crackers  Denies needing to urinate at this time

## 2022-03-09 NOTE — ED Triage Notes (Signed)
Pt brought in by [facility] for c/o overdose on unknown substance; when ems arrived pt was found agonal breathing and given 2 rescue breaths  Someone in household gave pt narcan 4mg  and first responders administered him narcan 1mg   Pt became alert after the 2 rescue breaths

## 2022-03-09 NOTE — ED Provider Notes (Signed)
[facility] EMERGENCY DEPARTMENT Provider Note   CSN: [id number] Arrival date & time: 03/09/22  1405     History  Chief Complaint  Patient presents with   Drug Overdose    Benjamin Vaughn. is a 52 y.o. male.  Patient presents after unresponsive episode. Pt very poor historian/poorly cooperative - level 5 caveat - indicates he had taken something for pain, wont say what it was but only that he had it - was noted by family to be unresponsive EMS was called, gave narcan and a couple rescue breaths with return of alertness and responsiveness.  Pt denies trying to harm self. Denies depression or SI.  Pt currently denies any c/o.   The history is provided by the patient, medical records and the EMS personnel.  Drug Overdose Pertinent negatives include no chest pain, no abdominal pain, no headaches and no shortness of breath.       Home Medications Prior to Admission medications   Medication Sig Start Date End Date Taking? Authorizing Provider  amLODipine (NORVASC) 5 MG tablet Take 1 tablet (5 mg total) by mouth daily. 07/29/21   Noemi Chapel, MD  diphenhydrAMINE (BENADRYL) 25 MG tablet Take 25 mg by mouth every 6 (six) hours as needed for allergies.    [provider]  doxycycline (VIBRAMYCIN) 100 MG capsule Take 1 capsule (100 mg total) by mouth 2 (two) times daily. 12/08/21   Hayden Rasmussen, MD  PROAIR HFA 108 [phone] Base) MCG/ACT inhaler Inhale 1-2 puffs into the lungs every 6 (six) hours as needed for wheezing or shortness of breath.  04/29/18   [provider]  tamsulosin (FLOMAX) 0.4 MG CAPS capsule Take 1 capsule (0.4 mg total) by mouth daily. 09/23/20   McKenzie, Candee Furbish, MD      Allergies    Aspirin, Bactrim, Hydrocodone-acetaminophen, Nsaids, Sulfamethoxazole, Tylenol [acetaminophen], Vicodin [hydrocodone-acetaminophen], and Sulfamethoxazole-trimethoprim    Review of Systems   Review of Systems  Constitutional:  Negative for fever.  HENT:  Negative for  sore throat.   Eyes:  Negative for visual disturbance.  Respiratory:  Negative for shortness of breath.   Cardiovascular:  Negative for chest pain.  Gastrointestinal:  Negative for abdominal pain and vomiting.  Genitourinary:  Negative for flank pain.  Musculoskeletal:  Negative for back pain and neck pain.  Skin:  Negative for rash.  Neurological:  Negative for headaches.  Hematological:  Does not bruise/bleed easily.    Physical Exam Updated Vital Signs BP (!) 175/112 (BP Location: Left Arm)   Pulse 98   Temp 99.2 F (37.3 C) (Oral)   Resp 12   Ht 1.803 m (5\' 11" )   Wt 63.5 kg   SpO2 100%   BMI 19.53 kg/m  Physical Exam Vitals and nursing note reviewed.  Constitutional:      Appearance: Normal appearance. He is well-developed.  HENT:     Head: Atraumatic.     Nose: Nose normal.     Mouth/Throat:     Mouth: Mucous membranes are moist.     Pharynx: Oropharynx is clear.  Eyes:     General: No scleral icterus.    Conjunctiva/sclera: Conjunctivae normal.     Pupils: Pupils are equal, round, and reactive to light.  Neck:     Vascular: No carotid bruit.     Trachea: No tracheal deviation.     Comments: No stiffness or rigidity.  Cardiovascular:     Rate and Rhythm: Normal rate and regular rhythm.  Pulses: Normal pulses.     Heart sounds: Normal heart sounds. No murmur heard.    No friction rub. No gallop.  Pulmonary:     Effort: Pulmonary effort is normal. No accessory muscle usage or respiratory distress.     Breath sounds: Normal breath sounds.  Chest:     Chest wall: No tenderness.  Abdominal:     General: Bowel sounds are normal. There is no distension.     Palpations: Abdomen is soft. There is no mass.     Tenderness: There is no abdominal tenderness. There is no guarding.  Genitourinary:    Comments: No cva tenderness. Musculoskeletal:        General: No swelling.     Cervical back: Normal range of motion and neck supple. No rigidity or tenderness.      Comments: CTLS spine, non tender, aligned, no step off. Motor/sens grossly intact bil.   Skin:    General: Skin is warm and dry.     Findings: No rash.  Neurological:     General: No focal deficit present.     Mental Status: He is alert.     Comments: Alert, speech clear.   Psychiatric:        Mood and Affect: Mood normal.     ED Results / Procedures / Treatments   Labs (all labs ordered are listed, but only abnormal results are displayed) Results for orders placed or performed during the hospital encounter of 12/08/21  CBC with Differential  Result Value Ref Range   WBC 9.7 4.0 - 10.5 K/uL   RBC 4.98 4.22 - 5.81 MIL/uL   Hemoglobin 14.8 13.0 - 17.0 g/dL   HCT 82.9 56.2 - 13.0 %   MCV 88.8 80.0 - 100.0 fL   MCH 29.7 26.0 - 34.0 pg   MCHC 33.5 30.0 - 36.0 g/dL   RDW 86.5 78.4 - 69.6 %   Platelets 379 150 - 400 K/uL   nRBC 0.0 0.0 - 0.2 %   Neutrophils Relative % 65 %   Neutro Abs 6.3 1.7 - 7.7 K/uL   Lymphocytes Relative 24 %   Lymphs Abs 2.4 0.7 - 4.0 K/uL   Monocytes Relative 8 %   Monocytes Absolute 0.8 0.1 - 1.0 K/uL   Eosinophils Relative 2 %   Eosinophils Absolute 0.2 0.0 - 0.5 K/uL   Basophils Relative 1 %   Basophils Absolute 0.1 0.0 - 0.1 K/uL   Immature Granulocytes 0 %   Abs Immature Granulocytes 0.03 0.00 - 0.07 K/uL  Basic metabolic panel  Result Value Ref Range   Sodium 140 135 - 145 mmol/L   Potassium 3.4 (L) 3.5 - 5.1 mmol/L   Chloride 100 98 - 111 mmol/L   CO2 29 22 - 32 mmol/L   Glucose, Bld 108 (H) 70 - 99 mg/dL   BUN 12 6 - 20 mg/dL   Creatinine, Ser 2.95 0.61 - 1.24 mg/dL   Calcium 9.7 8.9 - 28.4 mg/dL   GFR, Estimated >13 >24 mL/min   Anion gap 11 5 - 15   DG Chest Port 1 View  Result Date: 03/09/2022 CLINICAL DATA:  Altered mental status EXAM: PORTABLE CHEST 1 VIEW COMPARISON:  Chest x-ray Jul 29, 2021 FINDINGS: The cardiomediastinal silhouette is unchanged in contour. No focal pulmonary opacity. No pleural effusion or pneumothorax. The  visualized upper abdomen is unremarkable. No acute osseous abnormality. IMPRESSION: No acute cardiopulmonary abnormality. Electronically Signed   By: Cordelia Pen.D.  On: 03/09/2022 14:29     EKG None  Radiology DG Chest [facility] 1 View  Result Date: 03/09/2022 CLINICAL DATA:  Altered mental status EXAM: PORTABLE CHEST 1 VIEW COMPARISON:  Chest x-ray Jul 29, 2021 FINDINGS: The cardiomediastinal silhouette is unchanged in contour. No focal pulmonary opacity. No pleural effusion or pneumothorax. The visualized upper abdomen is unremarkable. No acute osseous abnormality. IMPRESSION: No acute cardiopulmonary abnormality. Electronically Signed   By: Beryle Flock M.D.   On: 03/09/2022 14:29    Procedures Procedures    Medications Ordered in ED Medications - No data to display  ED Course/ Medical Decision Making/ A&P                           Medical Decision Making Problems Addressed: Accidental overdose, initial encounter: acute illness or injury with systemic symptoms that poses a threat to life or bodily functions Elevated blood pressure reading: acute illness or injury that poses a threat to life or bodily functions Essential hypertension: chronic illness or injury with exacerbation, progression, or side effects of treatment that poses a threat to life or bodily functions  Amount and/or Complexity of Data Reviewed Independent Historian: EMS    Details: hx External Data Reviewed: notes. Labs: ordered. Decision-making details documented in ED Course. Radiology: ordered and independent interpretation performed. Decision-making details documented in ED Course. ECG/medicine tests: ordered and independent interpretation performed. Decision-making details documented in ED Course.  Risk Prescription drug management. Decision regarding hospitalization.   Iv ns. Continuous pulse ox and cardiac monitoring. Labs ordered/sent. Imaging ordered.   Differential diagnosis includes  accidental overdose, hypoglycemia, aki/dehydration, syncope, etc . Dispo decision including potential need for admission considered - will get labs and imaging and reassess.   Reviewed nursing notes and prior charts for additional history. External reports reviewed. Additional history from: EMS.   Cardiac monitor: sinus rhythm, rate  Labs reviewed/interpreted by me - pnd.  Xrays reviewed/interpreted by me - no pna/edema.   Po fluids/food. Ambulate in hall.   1605 labs pending - signed out to Dr Roderic Palau to check pending labs, recheck pt, and dispo appropriately.          Final Clinical Impression(s) / ED Diagnoses Final diagnoses:  None    Rx / DC Orders ED Discharge Orders     None         Lajean Saver, MD 03/09/22 1609

## 2022-03-15 ENCOUNTER — Ambulatory Visit
Admission: RE | Admit: 2022-03-15 | Discharge: 2022-03-15 | Disposition: A | Discharge status: Home / Self Care | Payer: Disability Insurance | Source: Ambulatory Visit

## 2022-03-15 ENCOUNTER — Ambulatory Visit
Admission: RE | Admit: 2022-03-15 | Discharge: 2022-03-15 | Disposition: A | Discharge status: Home / Self Care | Payer: Disability Insurance

## 2022-03-15 DIAGNOSIS — T148XXA Other injury of unspecified body region, initial encounter: Secondary | ICD-10-CM | POA: Diagnosis present

## 2022-03-15 DIAGNOSIS — M479 Spondylosis, unspecified: Secondary | ICD-10-CM | POA: Diagnosis present

## 2022-03-15 DIAGNOSIS — M779 Enthesopathy, unspecified: Secondary | ICD-10-CM | POA: Insufficient documentation

## 2022-06-08 ENCOUNTER — Other Ambulatory Visit: Payer: Self-pay

## 2022-06-08 ENCOUNTER — Encounter (HOSPITAL_COMMUNITY): Payer: Self-pay | Admitting: *Deleted

## 2022-06-08 ENCOUNTER — Emergency Department (HOSPITAL_COMMUNITY)
Admission: EM | Admit: 2022-06-08 | Discharge: 2022-06-08 | Disposition: A | Discharge status: Home / Self Care | Payer: Medicaid Other | Attending: Student | Admitting: Student

## 2022-06-08 ENCOUNTER — Emergency Department (HOSPITAL_COMMUNITY): Payer: Medicaid Other

## 2022-06-08 DIAGNOSIS — J40 Bronchitis, not specified as acute or chronic: Secondary | ICD-10-CM

## 2022-06-08 DIAGNOSIS — M79672 Pain in left foot: Secondary | ICD-10-CM | POA: Diagnosis present

## 2022-06-08 DIAGNOSIS — L03116 Cellulitis of left lower limb: Secondary | ICD-10-CM | POA: Diagnosis not present

## 2022-06-08 DIAGNOSIS — K0510 Chronic gingivitis, plaque induced: Secondary | ICD-10-CM | POA: Diagnosis not present

## 2022-06-08 DIAGNOSIS — Z79899 Other long term (current) drug therapy: Secondary | ICD-10-CM | POA: Insufficient documentation

## 2022-06-08 LAB — CBC WITH DIFFERENTIAL/PLATELET
Abs Immature Granulocytes: 0.04 10*3/uL (ref 0.00–0.07)
Basophils Absolute: 0 10*3/uL (ref 0.0–0.1)
Basophils Relative: 0 %
Eosinophils Absolute: 0.2 10*3/uL (ref 0.0–0.5)
Eosinophils Relative: 2 %
HCT: 37.5 % — ABNORMAL LOW (ref 39.0–52.0)
Hemoglobin: 12.6 g/dL — ABNORMAL LOW (ref 13.0–17.0)
Immature Granulocytes: 0 %
Lymphocytes Relative: 25 %
Lymphs Abs: 2.3 10*3/uL (ref 0.7–4.0)
MCH: 29.7 pg (ref 26.0–34.0)
MCHC: 33.6 g/dL (ref 30.0–36.0)
MCV: 88.4 fL (ref 80.0–100.0)
Monocytes Absolute: 1 10*3/uL (ref 0.1–1.0)
Monocytes Relative: 10 %
Neutro Abs: 5.9 10*3/uL (ref 1.7–7.7)
Neutrophils Relative %: 63 %
Platelets: 338 10*3/uL (ref 150–400)
RBC: 4.24 MIL/uL (ref 4.22–5.81)
RDW: 13.3 % (ref 11.5–15.5)
WBC: 9.5 10*3/uL (ref 4.0–10.5)
nRBC: 0 % (ref 0.0–0.2)

## 2022-06-08 LAB — BASIC METABOLIC PANEL
Anion gap: 9 (ref 5–15)
BUN: 15 mg/dL (ref 6–20)
CO2: 27 mmol/L (ref 22–32)
Calcium: 8.6 mg/dL — ABNORMAL LOW (ref 8.9–10.3)
Chloride: 99 mmol/L (ref 98–111)
Creatinine, Ser: 0.79 mg/dL (ref 0.61–1.24)
GFR, Estimated: >60 mL/min (ref >60)
Glucose, Bld: 85 mg/dL (ref 70–99)
Potassium: 3.4 mmol/L — ABNORMAL LOW (ref 3.5–5.1)
Sodium: 135 mmol/L (ref 135–145)

## 2022-06-08 LAB — SEDIMENTATION RATE: Sed Rate: 11 mm/hr (ref 0–16)

## 2022-06-08 LAB — C-REACTIVE PROTEIN: CRP: 0.7 mg/dL (ref <1.0)

## 2022-06-08 MED ORDER — KETOROLAC TROMETHAMINE 15 MG/ML IJ SOLN
15.0000 mg | Freq: Once | INTRAMUSCULAR | Status: DC
  Filled 2022-06-08: qty 1

## 2022-06-08 MED ORDER — OXYCODONE-ACETAMINOPHEN 5-325 MG PO TABS: 1.0000 | ORAL_TABLET | Freq: Four times a day (QID) | ORAL | 0 refills | Status: DC | PRN

## 2022-06-08 MED ORDER — OXYCODONE HCL 5 MG PO TABS
5.0000 mg | ORAL_TABLET | Freq: Once | ORAL | Status: AC
  Administered 2022-06-08: 5 mg via ORAL
  Filled 2022-06-08: qty 1

## 2022-06-08 MED ORDER — CHLORHEXIDINE GLUCONATE 0.12 % MT SOLN: 15.0000 mL | Freq: Two times a day (BID) | OROMUCOSAL | 0 refills | Status: AC

## 2022-06-08 MED ORDER — SODIUM CHLORIDE 0.9 % IV SOLN
1.0000 g | Freq: Once | INTRAVENOUS | Status: AC
  Administered 2022-06-08: 1 g via INTRAVENOUS
  Filled 2022-06-08: qty 10

## 2022-06-08 MED ORDER — CEPHALEXIN 500 MG PO CAPS: 500.0000 mg | ORAL_CAPSULE | Freq: Four times a day (QID) | ORAL | 0 refills | Status: AC

## 2022-06-08 MED ORDER — ALBUTEROL SULFATE HFA 108 (90 BASE) MCG/ACT IN AERS: 1.0000 | INHALATION_SPRAY | Freq: Four times a day (QID) | RESPIRATORY_TRACT | 0 refills | Status: AC | PRN

## 2022-06-08 NOTE — ED Provider Notes (Signed)
[place] EMERGENCY DEPARTMENT AT [facility] Provider Note   CSN: [id number] Arrival date & time: 06/08/22  1406     History  Chief Complaint  Patient presents with   Foot Pain    Dempsy R Jeni SallesMurray Jr. is a 52 y.o. male.  History of left comminuted calcaneal fracture.  He reports little complaints today. He states having bilateral foot pain with swelling worse on the left foot.  He states he had injury several years ago, states it without surgery.  He states he is on and off pain but this is worse.  He thinks the pain in his right foot is due putting more of his weight on that foot due to pain in the left foot.  He denies any new injury or trauma.  No numbness tingling or weakness, no fevers or chills.  He has been ambulating on this.  Orts pain has been worse for the past 3 days.  He also reports wheezing today.  States he has not smoked in many years, no history of asthma, states he does have occasional wheezing when he gets a cold.  No fevers, no sinus congestion.  No productive cough.  Complaint is that his lower gums are sore.  He states he feels like there is brown drainage coming from them.  Denies any facial swelling or redness, no trouble swallowing or breathing.  Orts this been going on for "a while".   Foot Pain       Home Medications Prior to Admission medications   Medication Sig Start Date End Date Taking? Authorizing Provider  albuterol (VENTOLIN HFA) 108 (90 Base) MCG/ACT inhaler Inhale 1-2 puffs into the lungs every 6 (six) hours as needed for wheezing or shortness of breath. 06/08/22  Yes Geremy Rister A, PA-C  cephALEXin (KEFLEX) 500 MG capsule Take 1 capsule (500 mg total) by mouth 4 (four) times daily for 7 days. 06/08/22 06/15/22 Yes Oluwademilade Kellett A, PA-C  chlorhexidine (PERIDEX) 0.12 % solution Use as directed 15 mLs in the mouth or throat 2 (two) times daily. 06/08/22  Yes Banyan Goodchild A, PA-C  amLODipine (NORVASC) 5 MG tablet Take 1 tablet (5 mg  total) by mouth daily. 07/29/21   Eber HongMiller, Brian, MD  diphenhydrAMINE (BENADRYL) 25 MG tablet Take 25 mg by mouth every 6 (six) hours as needed for allergies.    [provider]  doxycycline (VIBRAMYCIN) 100 MG capsule Take 1 capsule (100 mg total) by mouth 2 (two) times daily. 12/08/21   Terrilee FilesButler, Michael C, MD  oxyCODONE-acetaminophen (PERCOCET/ROXICET) 5-325 MG tablet Take 1 tablet by mouth every 6 (six) hours as needed for severe pain. 06/08/22   Carmel SacramentoBeatty, Reinhard Schack A, PA-C  PROAIR HFA 108 [phone](90 Base) MCG/ACT inhaler Inhale 1-2 puffs into the lungs every 6 (six) hours as needed for wheezing or shortness of breath.  04/29/18   [provider]  tamsulosin (FLOMAX) 0.4 MG CAPS capsule Take 1 capsule (0.4 mg total) by mouth daily. 09/23/20   McKenzie, Mardene CelestePatrick L, MD      Allergies    Aspirin, Bactrim, Hydrocodone-acetaminophen, Nsaids, Sulfamethoxazole, Tylenol [acetaminophen], Vicodin [hydrocodone-acetaminophen], and Sulfamethoxazole-trimethoprim    Review of Systems   Review of Systems  Physical Exam Updated Vital Signs BP (!) 154/115 (BP Location: Right Arm)   Pulse 95   Temp (!) 97.2 F (36.2 C)   Resp 18   Ht 5\' 11"  (1.803 m)   Wt 60.6 kg   SpO2 96%   BMI 18.63 kg/m  Physical  Exam Vitals and nursing note reviewed.  Constitutional:      General: He is not in acute distress.    Appearance: He is well-developed.  HENT:     Head: Normocephalic and atraumatic.     Mouth/Throat:     Mouth: Mucous membranes are moist.     Dentition: No dental tenderness, gingival swelling, dental caries, dental abscesses or gum lesions.     Pharynx: No pharyngeal swelling, oropharyngeal exudate, posterior oropharyngeal erythema or uvula swelling.  Eyes:     Conjunctiva/sclera: Conjunctivae normal.  Cardiovascular:     Rate and Rhythm: Normal rate and regular rhythm.     Heart sounds: No murmur heard. Pulmonary:     Effort: Pulmonary effort is normal. No respiratory distress.     Breath  sounds: Normal breath sounds.  Abdominal:     Palpations: Abdomen is soft.     Tenderness: There is no abdominal tenderness.  Musculoskeletal:        General: No swelling.     Cervical back: Neck supple.       Feet:     Comments: Swelling over left anus with some mild overlying erythema and warmth.  No drainage.  Calves are supple and equal circumference bilaterally.  Moderate tenderness to the EDL aspect of the heels bilateral.  There is no tenderness over Achilles tendon bilaterally.  Skin:    General: Skin is warm and dry.     Capillary Refill: Capillary refill takes less than 2 seconds.  Neurological:     General: No focal deficit present.     Mental Status: He is alert and oriented to person, place, and time.  Psychiatric:        Mood and Affect: Mood normal.     ED Results / Procedures / Treatments   Labs (all labs ordered are listed, but only abnormal results are displayed) Labs Reviewed  CBC WITH DIFFERENTIAL/PLATELET - Abnormal; Notable for the following components:      Result Value   Hemoglobin 12.6 (*)    HCT 37.5 (*)    All other components within normal limits  BASIC METABOLIC PANEL - Abnormal; Notable for the following components:   Potassium 3.4 (*)    Calcium 8.6 (*)    All other components within normal limits  SEDIMENTATION RATE  C-REACTIVE PROTEIN    EKG None  Radiology DG Chest Portable 1 View  Result Date: 06/08/2022 CLINICAL DATA:  Cough and wheezing for 3 days.  Sore throat. EXAM: PORTABLE CHEST 1 VIEW COMPARISON:  03/09/2022 FINDINGS: Perfectly oval-shaped density projecting over the right mid chest is most compatible with a button on the clothing or similar radiodensity outside of the patient. The lungs appear clear. Cardiac and mediastinal contours normal. No pleural effusion identified. Mild lower thoracic spondylosis. IMPRESSION: 1. No active cardiopulmonary disease is radiographically apparent. 2. Mild lower thoracic spondylosis. Electronically  Signed   By: Gaylyn Rong M.D.   On: 06/08/2022 15:35   DG Foot Complete Right  Result Date: 06/08/2022 CLINICAL DATA:  Bilateral foot pain and swelling EXAM: RIGHT FOOT COMPLETE - 3+ VIEW COMPARISON:  06/03/2014 FINDINGS: Mild bony demineralization. No fracture or acute bony findings. No significant malalignment. Mildly accentuated longitudinal arch of the foot may be projectional. Small plantar calcaneal spur. IMPRESSION: 1. Small plantar calcaneal spur. 2. Mild bony demineralization. 3. Mildly accentuated longitudinal arch of the foot may be projectional. Electronically Signed   By: Gaylyn Rong M.D.   On: 06/08/2022 15:33   DG  Foot Complete Left  Result Date: 06/08/2022 CLINICAL DATA:  Pain and swelling in the feet. EXAM: LEFT FOOT - COMPLETE 3+ VIEW COMPARISON:  04/30/2019 FINDINGS: Chronic bony demineralization. Compared to 04/30/2019 there appears to be some increase in sclerosis of the medial sesamoid of the first digit, raise the possibility of sesamoiditis-correlate with any point tenderness over the first digit sesamoid. Superimposition of the talus and calcaneus is thought to be projectional on the lateral view. The subtalar joints are obscured due to this angulation. I do not see a discrete fracture but the hindfoot is difficult to fully characterize. Small plantar calcaneal spur. IMPRESSION: 1. Increased sclerosis of the medial sesamoid of the first digit, raises the possibility of sesamoiditis-correlate with any point tenderness over the first digit sesamoid. 2. Chronic bony demineralization. 3. Small plantar calcaneal spur. 4. The hindfoot is difficult to fully characterize due to this angulation on the lateral view. If there is a high clinical suspicion for hindfoot fracture, CT would be recommended. Electronically Signed   By: Gaylyn RongWalter  Liebkemann M.D.   On: 06/08/2022 15:32    Procedures Procedures    Medications Ordered in ED Medications  ketorolac (TORADOL) 15 MG/ML  injection 15 mg (15 mg Intravenous Patient Refused/Not Given 06/08/22 1518)  oxyCODONE (Oxy IR/ROXICODONE) immediate release tablet 5 mg (5 mg Oral Given 06/08/22 1711)  cefTRIAXone (ROCEPHIN) 1 g in sodium chloride 0.9 % 100 mL IVPB (0 g Intravenous Stopped 06/08/22 1742)    ED Course/ Medical Decision Making/ A&P                             Medical Decision Making This patient presents to the ED for concern of bilateral foot pain left greater than right, this involves an extensive number of treatment options, and is a complaint that carries with it a high risk of complications and morbidity.  The differential diagnosis includes cellulitis , septic arthritis, gout, osteoarthritis, other   Co morbidities that complicate the patient evaluation  Polysubstance abuse   Additional history obtained:  Additional history obtained from EMR External records from outside source obtained and reviewed including multiple prior ED visits for foot pain, prior urology visits   Lab Tests:  I Ordered, and personally interpreted labs.  The pertinent results include: CBC does not show leukocytosis, hemoglobin slightly low at 12.6  CMP shows mild hypokalemia 3.4 slightly decreased calcium, otherwise normal   Imaging Studies ordered:  I ordered imaging studies including chest x-ray, bilateral foot x-ray I independently visualized and interpreted imaging which showed this x-ray does not show any pulmonary edema or infiltrates  Left foot shows small calcaneal spur-no fracture or dislocation chronic bony demineralization  Right foot shows small calcaneal spur no fracture or dislocation chronic bony demineralization   I agree with the radiologist interpretation     Problem List / ED Course / Critical interventions / Medication management  Patient with bilateral foot pain left greater than right with some redness and swelling to the right.  The foot is neurovascularly intact, concern for cellulitis  versus possible septic arthritis.  Patient also evaluated by my attending at bedside.  Patient has chronic decreased range of motion due to his prior fracture so it is difficult to assess for range of motion to the left ankle.  Plan to treat for cellulitis, obtain laboratory markers and CBC.  Plan to likely treat for cellulitis and have close outpatient follow-up with orthopedics.  Discussed with patient  may need arthrocentesis if not improving with antibiotics.  Currently with his normal CBC lack of systemic symptoms and baseline limited range of motion feels that likely represents cellulitis but patient is aware of possibility of septic arthritis and need for close outpatient follow-up I ordered medication including Percocet for pain, Rocephin for cellulitis Reevaluation of the patient after these medicines showed that the patient improved I have reviewed the patients home medicines and have made adjustments as needed    Test / Admission - Considered: Considered arthrocentesis-do not feel it is needed at this time, benefits do not outweigh risks     Amount and/or Complexity of Data Reviewed Labs: ordered. Radiology: ordered.  Risk Prescription drug management.           Final Clinical Impression(s) / ED Diagnoses Final diagnoses:  Cellulitis of left foot  Bronchitis  Gingivitis    Rx / DC Orders ED Discharge Orders          Ordered    chlorhexidine (PERIDEX) 0.12 % solution  2 times daily        06/08/22 1833    albuterol (VENTOLIN HFA) 108 (90 Base) MCG/ACT inhaler  Every 6 hours PRN        06/08/22 1834    cephALEXin (KEFLEX) 500 MG capsule  4 times daily        06/08/22 1835    oxyCODONE-acetaminophen (PERCOCET/ROXICET) 5-325 MG tablet  Every 6 hours PRN,   Status:  Discontinued        06/08/22 1846    oxyCODONE-acetaminophen (PERCOCET/ROXICET) 5-325 MG tablet  Every 6 hours PRN        06/08/22 1848              Ma Rings, PA-C 06/08/22 1853     Rondel Baton, MD 06/09/22 [phone]

## 2022-06-08 NOTE — ED Notes (Signed)
Attempted to administer toradol to pt. Pt refused and stated "fuck that shit, it don't do nothing for me, I like oxycodone, hydrocodone roxys, I don't want no shot".   PA notified.

## 2022-06-08 NOTE — Discharge Instructions (Addendum)
Seen today for knee pain.  We will scribed antibiotics for likely skin infection.  You need to follow-up closely with orthopedics for your chronic foot pain and swelling and have a recheck.  If the redness and swelling is not getting better.  You are given Peridex rinse for your gingivitis.  Please follow-up with dentist.  You are also given albuterol for your wheezing.  You to follow-up with primary care for this.  You likely have bronchitis.  Your chest x-ray did not show any signs of pneumonia, but did show some arthritis in your spine

## 2022-06-08 NOTE — ED Triage Notes (Signed)
Pt with bilateral feet pain for past 3 days. + wheezing today and sore throat. Has an appt with PC not til June.

## 2022-06-08 NOTE — ED Notes (Signed)
X-ray at bedside

## 2022-06-11 ENCOUNTER — Encounter (HOSPITAL_COMMUNITY): Payer: Self-pay

## 2022-06-11 ENCOUNTER — Emergency Department (HOSPITAL_COMMUNITY)
Admission: EM | Admit: 2022-06-11 | Discharge: 2022-06-11 | Disposition: A | Discharge status: Home / Self Care | Payer: Medicaid Other | Attending: Emergency Medicine | Admitting: Emergency Medicine

## 2022-06-11 ENCOUNTER — Other Ambulatory Visit: Payer: Self-pay

## 2022-06-11 DIAGNOSIS — L299 Pruritus, unspecified: Secondary | ICD-10-CM | POA: Insufficient documentation

## 2022-06-11 LAB — CBC
HCT: 35.1 % — ABNORMAL LOW (ref 39.0–52.0)
Hemoglobin: 11.3 g/dL — ABNORMAL LOW (ref 13.0–17.0)
MCH: 28.9 pg (ref 26.0–34.0)
MCHC: 32.2 g/dL (ref 30.0–36.0)
MCV: 89.8 fL (ref 80.0–100.0)
Platelets: 300 10*3/uL (ref 150–400)
RBC: 3.91 MIL/uL — ABNORMAL LOW (ref 4.22–5.81)
RDW: 13.4 % (ref 11.5–15.5)
WBC: 7.7 10*3/uL (ref 4.0–10.5)
nRBC: 0 % (ref 0.0–0.2)

## 2022-06-11 LAB — COMPREHENSIVE METABOLIC PANEL
ALT: 22 U/L (ref 0–44)
AST: 30 U/L (ref 15–41)
Albumin: 4.1 g/dL (ref 3.5–5.0)
Alkaline Phosphatase: 57 U/L (ref 38–126)
Anion gap: 9 (ref 5–15)
BUN: 24 mg/dL — ABNORMAL HIGH (ref 6–20)
CO2: 27 mmol/L (ref 22–32)
Calcium: 8.4 mg/dL — ABNORMAL LOW (ref 8.9–10.3)
Chloride: 100 mmol/L (ref 98–111)
Creatinine, Ser: 0.96 mg/dL (ref 0.61–1.24)
GFR, Estimated: >60 mL/min (ref >60)
Glucose, Bld: 109 mg/dL — ABNORMAL HIGH (ref 70–99)
Potassium: 3.5 mmol/L (ref 3.5–5.1)
Sodium: 136 mmol/L (ref 135–145)
Total Bilirubin: 0.8 mg/dL (ref 0.3–1.2)
Total Protein: 7.6 g/dL (ref 6.5–8.1)

## 2022-06-11 LAB — SALICYLATE LEVEL: Salicylate Lvl: <7 mg/dL — ABNORMAL LOW (ref 7.0–30.0)

## 2022-06-11 LAB — ETHANOL: Alcohol, Ethyl (B): <10 mg/dL (ref <10)

## 2022-06-11 LAB — ACETAMINOPHEN LEVEL: Acetaminophen (Tylenol), Serum: <10 ug/mL — ABNORMAL LOW (ref 10–30)

## 2022-06-11 MED ORDER — HYDROXYZINE HCL 25 MG PO TABS: 25.0000 mg | ORAL_TABLET | Freq: Three times a day (TID) | ORAL | 2 refills | Status: AC | PRN

## 2022-06-11 MED ORDER — ALUM & MAG HYDROXIDE-SIMETH 200-200-20 MG/5ML PO SUSP
30.0000 mL | Freq: Once | ORAL | Status: AC
  Administered 2022-06-11: 30 mL via ORAL

## 2022-06-11 MED ORDER — LIDOCAINE VISCOUS HCL 2 % MT SOLN
15.0000 mL | Freq: Once | OROMUCOSAL | Status: AC
  Administered 2022-06-11: 15 mL via OROMUCOSAL
  Filled 2022-06-11: qty 15

## 2022-06-11 MED ORDER — ALUM & MAG HYDROXIDE-SIMETH 200-200-20 MG/5ML PO SUSP: 30.0000 mL | Freq: Once | ORAL | Status: DC

## 2022-06-11 MED ORDER — HYDROXYZINE HCL 50 MG/ML IM SOLN
50.0000 mg | Freq: Once | INTRAMUSCULAR | Status: AC
  Administered 2022-06-11: 50 mg via INTRAMUSCULAR
  Filled 2022-06-11: qty 1

## 2022-06-11 MED ORDER — HYDROXYZINE HCL 25 MG PO TABS: 25.0000 mg | ORAL_TABLET | Freq: Three times a day (TID) | ORAL | 2 refills | Status: DC | PRN

## 2022-06-11 NOTE — ED Provider Notes (Signed)
[place] EMERGENCY DEPARTMENT AT [facility] Provider Note   CSN: [id number] Arrival date & time: 06/11/22  8032     History {Add pertinent medical, surgical, social history, OB history to HPI:1} Chief Complaint  Patient presents with   Hallucinations    Benjamin Vaughn. is a 52 y.o. male.  Patient complains of black things on his body and itching all over.  Patient not having auditory or visual hallucinations   Rash      Home Medications Prior to Admission medications   Medication Sig Start Date End Date Taking? Authorizing Provider  albuterol (VENTOLIN HFA) 108 (90 Base) MCG/ACT inhaler Inhale 1-2 puffs into the lungs every 6 (six) hours as needed for wheezing or shortness of breath. 06/08/22  Yes Beatty, Celeste A, PA-C  amLODipine (NORVASC) 5 MG tablet Take 1 tablet (5 mg total) by mouth daily. 07/29/21  Yes Eber Hong, MD  cephALEXin (KEFLEX) 500 MG capsule Take 1 capsule (500 mg total) by mouth 4 (four) times daily for 7 days. 06/08/22 06/15/22 Yes Beatty, Celeste A, PA-C  chlorhexidine (PERIDEX) 0.12 % solution Use as directed 15 mLs in the mouth or throat 2 (two) times daily. 06/08/22  Yes Beatty, Celeste A, PA-C  diphenhydrAMINE (BENADRYL) 25 MG tablet Take 25 mg by mouth every 6 (six) hours as needed for allergies.   Yes [provider]  DULoxetine (CYMBALTA) 60 MG capsule Take 60 mg by mouth daily. 06/10/22  Yes [provider]  hydrOXYzine (ATARAX) 25 MG tablet Take 1 tablet (25 mg total) by mouth every 8 (eight) hours as needed for itching or anxiety. 06/11/22  Yes Bethann Berkshire, MD  LINZESS 145 MCG CAPS capsule Take 145 mcg by mouth daily. 06/10/22  Yes [provider]  oxyCODONE-acetaminophen (PERCOCET/ROXICET) 5-325 MG tablet Take 1 tablet by mouth every 6 (six) hours as needed for severe pain. 06/08/22  Yes Beatty, Celeste A, PA-C  doxycycline (VIBRAMYCIN) 100 MG capsule Take 1 capsule (100 mg total) by mouth 2 (two) times  daily. Patient not taking: Reported on 06/11/2022 12/08/21   Terrilee Files, MD  tamsulosin (FLOMAX) 0.4 MG CAPS capsule Take 1 capsule (0.4 mg total) by mouth daily. Patient not taking: Reported on 06/11/2022 09/23/20   Malen Gauze, MD      Allergies    Aspirin, Bactrim, Hydrocodone-acetaminophen, Nsaids, Sulfamethoxazole, Tylenol [acetaminophen], Vicodin [hydrocodone-acetaminophen], and Sulfamethoxazole-trimethoprim    Review of Systems   Review of Systems  Skin:  Positive for rash.    Physical Exam Updated Vital Signs BP 117/81 (BP Location: Right Arm)   Pulse 67   Temp 97.9 F (36.6 C) (Oral)   Resp 18   SpO2 97%  Physical Exam  ED Results / Procedures / Treatments   Labs (all labs ordered are listed, but only abnormal results are displayed) Labs Reviewed  COMPREHENSIVE METABOLIC PANEL - Abnormal; Notable for the following components:      Result Value   Glucose, Bld 109 (*)    BUN 24 (*)    Calcium 8.4 (*)    All other components within normal limits  SALICYLATE LEVEL - Abnormal; Notable for the following components:   Salicylate Lvl <7.0 (*)    All other components within normal limits  ACETAMINOPHEN LEVEL - Abnormal; Notable for the following components:   Acetaminophen (Tylenol), Serum <10 (*)    All other components within normal limits  CBC - Abnormal; Notable for the following components:   RBC 3.91 (*)  Hemoglobin 11.3 (*)    HCT 35.1 (*)    All other components within normal limits  ETHANOL  RAPID URINE DRUG SCREEN, HOSP PERFORMED    EKG None  Radiology No results found.  Procedures Procedures  {Document cardiac monitor, telemetry assessment procedure when appropriate:1}  Medications Ordered in ED Medications  hydrOXYzine (VISTARIL) injection 50 mg (50 mg Intramuscular Given 06/11/22 0828)  lidocaine (XYLOCAINE) 2 % viscous mouth solution 15 mL (15 mLs Mouth/Throat Given 06/11/22 0933)  alum & mag hydroxide-simeth (MAALOX/MYLANTA)  200-200-20 MG/5ML suspension 30 mL (30 mLs Oral Given 06/11/22 0932)    ED Course/ Medical Decision Making/ A&P   {   Click here for ABCD2, HEART and other calculatorsREFRESH Note before signing :1}                          Medical Decision Making Amount and/or Complexity of Data Reviewed Labs: ordered.  Risk OTC drugs. Prescription drug management.   Patient with anxiety and pruritus.  He is given Vivitrol and will follow-up with PCP and also referred to [facility] for the anxiety  {Document critical care time when appropriate:1} {Document review of labs and clinical decision tools ie heart score, Chads2Vasc2 etc:1}  {Document your independent review of radiology images, and any outside records:1} {Document your discussion with family members, caretakers, and with consultants:1} {Document social determinants of health affecting pt's care:1} {Document your decision making why or why not admission, treatments were needed:1} Final Clinical Impression(s) / ED Diagnoses Final diagnoses:  Pruritus    Rx / DC Orders ED Discharge Orders          Ordered    hydrOXYzine (ATARAX) 25 MG tablet  Every 8 hours PRN        06/11/22 1048

## 2022-06-11 NOTE — ED Triage Notes (Signed)
Patient arrives stating he has black things on his body. States "it's in everything I eat and drink and irritates my throat". Patient is fidgety during triage. States he is dizzy while walking. No visibly deformities or objects crawling on the patients body at this time. Denies drugs or alcohol.

## 2022-06-11 NOTE — Discharge Instructions (Signed)
Follow-up with your family doctor in a week and also follow-up with [facility]

## 2022-06-20 ENCOUNTER — Encounter: Payer: Self-pay | Admitting: Orthopedic Surgery

## 2022-06-20 ENCOUNTER — Ambulatory Visit (INDEPENDENT_AMBULATORY_CARE_PROVIDER_SITE_OTHER): Payer: Medicaid Other | Admitting: Orthopedic Surgery

## 2022-06-20 VITALS — BP 159/99 | HR 80 | Ht 71.0 in | Wt 128.0 lb

## 2022-06-20 DIAGNOSIS — R2681 Unsteadiness on feet: Secondary | ICD-10-CM | POA: Diagnosis not present

## 2022-06-20 DIAGNOSIS — M79672 Pain in left foot: Secondary | ICD-10-CM | POA: Diagnosis not present

## 2022-06-20 DIAGNOSIS — G8929 Other chronic pain: Secondary | ICD-10-CM

## 2022-06-20 DIAGNOSIS — S92015D Nondisplaced fracture of body of left calcaneus, subsequent encounter for fracture with routine healing: Secondary | ICD-10-CM | POA: Diagnosis not present
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
## 2022-06-28 ENCOUNTER — Ambulatory Visit (INDEPENDENT_AMBULATORY_CARE_PROVIDER_SITE_OTHER): Payer: Medicaid Other | Admitting: Orthopedic Surgery

## 2022-06-28 DIAGNOSIS — S92015D Nondisplaced fracture of body of left calcaneus, subsequent encounter for fracture with routine healing: Secondary | ICD-10-CM

## 2022-07-08 ENCOUNTER — Encounter: Payer: Self-pay | Admitting: Orthopedic Surgery
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
## 2022-07-27 ENCOUNTER — Telehealth: Payer: Self-pay | Admitting: Orthopedic Surgery

## 2022-07-27 NOTE — Telephone Encounter (Signed)
I called Mr. Benjamin Vaughn to schedule surgery, left a message on his voicemail to please return my call to discuss possible dates.

## 2022-07-27 NOTE — Telephone Encounter (Signed)
-----   Message from [employer]  Best sent at 07/20/2022  3:03 PM EDT ----- Regarding: left ankle pains Benjamin Vaughn, This pt is asking for a call back to set up left ankle surgery. Can you call this pt about this matter.  Thanks

## 2022-08-07 ENCOUNTER — Encounter (HOSPITAL_COMMUNITY): Payer: Self-pay | Admitting: Orthopedic Surgery

## 2022-08-07 ENCOUNTER — Other Ambulatory Visit: Payer: Self-pay

## 2022-08-07 NOTE — Progress Notes (Addendum)
SDW CALL  Patient was given pre-op instructions over the phone. The opportunity was given for the patient to ask questions. No further questions asked. Patient verbalized understanding of instructions given.   PCP - [facility] Department  Cardiologist - denies  PPM/ICD - denies Device Orders -  Rep Notified -   Chest x-ray - 06/08/22 EKG - 03/12/22 Stress Test - none ECHO - none Cardiac Cath -none   Sleep Study - none CPAP - none  Fasting Blood Sugar - na Checks Blood Sugar _____ times a day  Blood Thinner Instructions:na Aspirin Instructions:na  ERAS Protcol -clears [phone] PRE-SURGERY Ensure or G2- no  COVID TEST- na   Anesthesia review: no  Patient denies shortness of breath, fever, cough and chest pain over the phone call    Surgical Instructions    Your procedure is scheduled on June 5,2024  Report to [facility] Main Entrance "A" at 7:45 A.M., then check in with the Admitting office.  Call this number if you have problems the morning of surgery:  [phone]    Remember:  Do not eat after midnight the night before your surgery  You may drink clear liquids until 7:15am the morning of your surgery.   Clear liquids allowed are: Water, Non-Citrus Juices (without pulp), Carbonated Beverages, Clear Tea, Black Coffee ONLY (NO MILK, CREAM OR POWDERED CREAMER of any kind), and Gatorade   Take these medicines the morning of surgery with A SIP OF WATER: Norvasc,Ventolin prn,Peridex prn, Benadryl prn.  As of today, STOP taking any Aspirin (unless otherwise instructed by your surgeon) Aleve, Naproxen, Ibuprofen, Motrin, Advil, Goody's, BC's, all herbal medications, fish oil, and all vitamins.  [facility] is not responsible for any belongings or valuables. .   Do NOT Smoke (Tobacco/Vaping)  24 hours prior to your procedure  If you use a CPAP at night, you may bring your mask for your overnight stay.   Contacts, glasses, hearing aids, dentures or  partials may not be worn into surgery, please bring cases for these belongings   Patients discharged the day of surgery will not be allowed to drive home, and someone needs to stay with them for 24 hours.   SURGICAL WAITING ROOM VISITATION You may have 1 visitor in the pre-op area at a time determined by the pre-op nurse. (Visitor may not switch out) Patients having surgery or a procedure in a hospital may have two support people in the waiting room. Children under the age of 4 must have an adult with them who is not the patient. They may stay in the waiting area during the procedure and may switch out with other visitors. If the patient needs to stay at the hospital during part of their recovery, the visitor guidelines for inpatient rooms apply.  Please refer to the [facility] website for the visitor guidelines for Inpatients (after your surgery is over and you are in a regular room).     Special instructions:    Oral Hygiene is also important to reduce your risk of infection.  Remember - BRUSH YOUR TEETH THE MORNING OF SURGERY WITH YOUR REGULAR TOOTHPASTE   Day of Surgery:  Take a shower the day of or night before with antibacterial soap. Wear Clean/Comfortable clothing the morning of surgery Do not apply any deodorants/lotions.   Do not wear jewelry or makeup Do not wear lotions, powders, perfumes/colognes, or deodorant. Do not shave 48 hours prior to surgery.  Men may shave face and neck. Do not bring  valuables to the hospital. Do not wear nail polish, gel polish, artificial nails, or any other type of covering on natural nails (fingers and toes) If you have artificial nails or gel coating that need to be removed by a nail salon, please have this removed prior to surgery. Artificial nails or gel coating may interfere with anesthesia's ability to adequately monitor your vital signs. Remember to brush your teeth WITH YOUR REGULAR TOOTHPASTE.

## 2022-08-08 ENCOUNTER — Ambulatory Visit (HOSPITAL_BASED_OUTPATIENT_CLINIC_OR_DEPARTMENT_OTHER): Payer: Medicaid Other | Admitting: Registered Nurse

## 2022-08-08 ENCOUNTER — Other Ambulatory Visit: Payer: Self-pay

## 2022-08-08 ENCOUNTER — Ambulatory Visit (HOSPITAL_COMMUNITY)
Admission: RE | Admit: 2022-08-08 | Discharge: 2022-08-08 | Disposition: A | Discharge status: Home / Self Care | Payer: Medicaid Other | Attending: Orthopedic Surgery | Admitting: Orthopedic Surgery

## 2022-08-08 ENCOUNTER — Ambulatory Visit (HOSPITAL_COMMUNITY): Payer: Medicaid Other

## 2022-08-08 ENCOUNTER — Encounter (HOSPITAL_COMMUNITY): Payer: Self-pay | Admitting: Orthopedic Surgery

## 2022-08-08 ENCOUNTER — Ambulatory Visit (HOSPITAL_COMMUNITY): Payer: Medicaid Other | Admitting: Registered Nurse

## 2022-08-08 ENCOUNTER — Encounter (HOSPITAL_COMMUNITY)
Admission: RE | Disposition: A | Discharge status: Home / Self Care | Payer: Self-pay | Source: Home / Self Care | Attending: Orthopedic Surgery

## 2022-08-08 DIAGNOSIS — M12572 Traumatic arthropathy, left ankle and foot: Secondary | ICD-10-CM | POA: Insufficient documentation

## 2022-08-08 DIAGNOSIS — F32A Depression, unspecified: Secondary | ICD-10-CM | POA: Diagnosis not present

## 2022-08-08 DIAGNOSIS — F419 Anxiety disorder, unspecified: Secondary | ICD-10-CM | POA: Insufficient documentation

## 2022-08-08 DIAGNOSIS — F418 Other specified anxiety disorders: Secondary | ICD-10-CM | POA: Diagnosis not present

## 2022-08-08 DIAGNOSIS — Z87891 Personal history of nicotine dependence: Secondary | ICD-10-CM | POA: Insufficient documentation

## 2022-08-08 DIAGNOSIS — I10 Essential (primary) hypertension: Secondary | ICD-10-CM

## 2022-08-08 DIAGNOSIS — S92015P Nondisplaced fracture of body of left calcaneus, subsequent encounter for fracture with malunion: Secondary | ICD-10-CM | POA: Diagnosis not present

## 2022-08-08 DIAGNOSIS — Z8781 Personal history of (healed) traumatic fracture: Secondary | ICD-10-CM | POA: Diagnosis not present

## 2022-08-08 HISTORY — PX: FOOT ARTHRODESIS: SHX1655

## 2022-08-08 LAB — BASIC METABOLIC PANEL
Anion gap: 12 (ref 5–15)
BUN: 10 mg/dL (ref 6–20)
CO2: 27 mmol/L (ref 22–32)
Calcium: 9.1 mg/dL (ref 8.9–10.3)
Chloride: 98 mmol/L (ref 98–111)
Creatinine, Ser: 0.8 mg/dL (ref 0.61–1.24)
GFR, Estimated: >60 mL/min (ref >60)
Glucose, Bld: 112 mg/dL — ABNORMAL HIGH (ref 70–99)
Potassium: 3.2 mmol/L — ABNORMAL LOW (ref 3.5–5.1)
Sodium: 137 mmol/L (ref 135–145)

## 2022-08-08 LAB — CBC
HCT: 41.1 % (ref 39.0–52.0)
Hemoglobin: 14 g/dL (ref 13.0–17.0)
MCH: 29.7 pg (ref 26.0–34.0)
MCHC: 34.1 g/dL (ref 30.0–36.0)
MCV: 87.3 fL (ref 80.0–100.0)
Platelets: 315 10*3/uL (ref 150–400)
RBC: 4.71 MIL/uL (ref 4.22–5.81)
RDW: 12.8 % (ref 11.5–15.5)
WBC: 8.6 10*3/uL (ref 4.0–10.5)
nRBC: 0 % (ref 0.0–0.2)

## 2022-08-08 SURGERY — FUSION, JOINT, FOOT
Anesthesia: Monitor Anesthesia Care | Site: Ankle | Laterality: Left

## 2022-08-08 MED ORDER — OXYCODONE HCL 5 MG/5ML PO SOLN: 5.0000 mg | Freq: Once | ORAL | Status: DC | PRN

## 2022-08-08 MED ORDER — CHLORHEXIDINE GLUCONATE 0.12 % MT SOLN
15.0000 mL | Freq: Once | OROMUCOSAL | Status: AC
  Administered 2022-08-08: 15 mL via OROMUCOSAL
  Filled 2022-08-08: qty 15

## 2022-08-08 MED ORDER — BUPIVACAINE LIPOSOME 1.3 % IJ SUSP
INTRAMUSCULAR | Status: DC | PRN
  Administered 2022-08-08: 10 mL via PERINEURAL

## 2022-08-08 MED ORDER — 0.9 % SODIUM CHLORIDE (POUR BTL) OPTIME
TOPICAL | Status: DC | PRN
  Administered 2022-08-08: 1000 mL

## 2022-08-08 MED ORDER — ONDANSETRON HCL 4 MG/2ML IJ SOLN
INTRAMUSCULAR | Status: DC | PRN
  Administered 2022-08-08: 4 mg via INTRAVENOUS

## 2022-08-08 MED ORDER — LACTATED RINGERS IV SOLN: INTRAVENOUS | Status: DC

## 2022-08-08 MED ORDER — CEFAZOLIN SODIUM-DEXTROSE 2-4 GM/100ML-% IV SOLN
2.0000 g | INTRAVENOUS | Status: AC
  Administered 2022-08-08: 2 g via INTRAVENOUS
  Filled 2022-08-08: qty 100

## 2022-08-08 MED ORDER — LIDOCAINE HCL (PF) 1 % IJ SOLN
INTRAMUSCULAR | Status: AC
  Filled 2022-08-08: qty 30

## 2022-08-08 MED ORDER — ORAL CARE MOUTH RINSE: 15.0000 mL | Freq: Once | OROMUCOSAL | Status: AC

## 2022-08-08 MED ORDER — OXYCODONE HCL 5 MG PO TABS: 5.0000 mg | ORAL_TABLET | ORAL | 0 refills | Status: DC | PRN

## 2022-08-08 MED ORDER — MIDAZOLAM HCL 2 MG/2ML IJ SOLN
INTRAMUSCULAR | Status: AC
  Administered 2022-08-08: 2 mg
  Filled 2022-08-08: qty 2

## 2022-08-08 MED ORDER — OXYCODONE HCL 5 MG PO TABS: 5.0000 mg | ORAL_TABLET | Freq: Once | ORAL | Status: DC | PRN

## 2022-08-08 MED ORDER — FENTANYL CITRATE (PF) 100 MCG/2ML IJ SOLN
INTRAMUSCULAR | Status: AC
  Administered 2022-08-08: 100 ug
  Filled 2022-08-08: qty 2

## 2022-08-08 MED ORDER — MIDAZOLAM HCL 2 MG/2ML IJ SOLN
INTRAMUSCULAR | Status: AC
  Filled 2022-08-08: qty 2

## 2022-08-08 MED ORDER — BUPIVACAINE-EPINEPHRINE (PF) 0.5% -1:200000 IJ SOLN
INTRAMUSCULAR | Status: DC | PRN
  Administered 2022-08-08: 15 mL via PERINEURAL
  Administered 2022-08-08: 5 mL via PERINEURAL

## 2022-08-08 MED ORDER — FENTANYL CITRATE (PF) 100 MCG/2ML IJ SOLN: 25.0000 ug | INTRAMUSCULAR | Status: DC | PRN

## 2022-08-08 MED ORDER — EPHEDRINE SULFATE-NACL 50-0.9 MG/10ML-% IV SOSY
PREFILLED_SYRINGE | INTRAVENOUS | Status: DC | PRN
  Administered 2022-08-08 (×2): 10 mg via INTRAVENOUS
  Administered 2022-08-08: 5 mg via INTRAVENOUS

## 2022-08-08 MED ORDER — PHENYLEPHRINE HCL-NACL 20-0.9 MG/250ML-% IV SOLN
INTRAVENOUS | Status: DC | PRN
  Administered 2022-08-08 (×2): 80 ug via INTRAVENOUS

## 2022-08-08 MED ORDER — PROPOFOL 10 MG/ML IV BOLUS
INTRAVENOUS | Status: DC | PRN
  Administered 2022-08-08: 30 mg via INTRAVENOUS

## 2022-08-08 MED ORDER — DEXAMETHASONE SODIUM PHOSPHATE 10 MG/ML IJ SOLN
INTRAMUSCULAR | Status: DC | PRN
  Administered 2022-08-08: 5 mg via INTRAVENOUS

## 2022-08-08 MED ORDER — MIDAZOLAM HCL 2 MG/2ML IJ SOLN
INTRAMUSCULAR | Status: DC | PRN
  Administered 2022-08-08: 2 mg via INTRAVENOUS

## 2022-08-08 MED ORDER — PROPOFOL 500 MG/50ML IV EMUL
INTRAVENOUS | Status: DC | PRN
  Administered 2022-08-08: 100 ug/kg/min via INTRAVENOUS

## 2022-08-08 SURGICAL SUPPLY — 53 items
BAG COUNTER SPONGE SURGICOUNT (BAG) ×1 IMPLANT
BAG SPNG CNTER NS LX DISP (BAG) ×1
BANDAGE ESMARK 6X9 LF (GAUZE/BANDAGES/DRESSINGS) IMPLANT
BIT DRILL 4.5 TYB 9 (BIT) IMPLANT
BLADE SAW SGTL HD 18.5X60.5X1. (BLADE) ×1 IMPLANT
BLADE SURG 10 STRL SS (BLADE) IMPLANT
BLADE SURG 21 STRL SS (BLADE) IMPLANT
BNDG CMPR 5X4 CHSV STRCH STRL (GAUZE/BANDAGES/DRESSINGS) ×1
BNDG CMPR 9X6 STRL LF SNTH (GAUZE/BANDAGES/DRESSINGS)
BNDG COHESIVE 4X5 TAN STRL (GAUZE/BANDAGES/DRESSINGS) ×1 IMPLANT
BNDG COHESIVE 4X5 TAN STRL LF (GAUZE/BANDAGES/DRESSINGS) IMPLANT
BNDG ESMARK 6X9 LF (GAUZE/BANDAGES/DRESSINGS)
BNDG GAUZE DERMACEA FLUFF 4 (GAUZE/BANDAGES/DRESSINGS) ×2 IMPLANT
BNDG GZE DERMACEA 4 6PLY (GAUZE/BANDAGES/DRESSINGS) ×1
BUR EGG ELITE 4.0 (BURR) IMPLANT
COTTON STERILE ROLL (GAUZE/BANDAGES/DRESSINGS) ×1 IMPLANT
COVER MAYO STAND STRL (DRAPES) IMPLANT
COVER SURGICAL LIGHT HANDLE (MISCELLANEOUS) ×2 IMPLANT
DRAPE INCISE IOBAN 66X45 STRL (DRAPES) ×1 IMPLANT
DRAPE OEC MINIVIEW 54X84 (DRAPES) IMPLANT
DRAPE U-SHAPE 47X51 STRL (DRAPES) ×1 IMPLANT
DRSG ADAPTIC 3X8 NADH LF (GAUZE/BANDAGES/DRESSINGS) ×1 IMPLANT
DURAPREP 26ML APPLICATOR (WOUND CARE) ×1 IMPLANT
ELECT REM PT RETURN 9FT ADLT (ELECTROSURGICAL) ×1
ELECTRODE REM PT RTRN 9FT ADLT (ELECTROSURGICAL) ×1 IMPLANT
GAUZE PAD ABD 8X10 STRL (GAUZE/BANDAGES/DRESSINGS) IMPLANT
GAUZE SPONGE 4X4 12PLY STRL (GAUZE/BANDAGES/DRESSINGS) ×1 IMPLANT
GLOVE BIOGEL PI IND STRL 9 (GLOVE) ×1 IMPLANT
GLOVE SURG ORTHO 9.0 STRL STRW (GLOVE) ×1 IMPLANT
GOWN STRL REUS W/ TWL XL LVL3 (GOWN DISPOSABLE) ×3 IMPLANT
GOWN STRL REUS W/TWL XL LVL3 (GOWN DISPOSABLE) ×3
GUIDEWIRE TYB 2X9 (WIRE) IMPLANT
KIT BASIN OR (CUSTOM PROCEDURE TRAY) ×1 IMPLANT
KIT TURNOVER KIT B (KITS) ×1 IMPLANT
MANIFOLD NEPTUNE II (INSTRUMENTS) ×1 IMPLANT
NDL HYPO 22X1.5 SAFETY MO (MISCELLANEOUS) IMPLANT
NEEDLE HYPO 22X1.5 SAFETY MO (MISCELLANEOUS) ×1 IMPLANT
NS IRRIG 1000ML POUR BTL (IV SOLUTION) ×1 IMPLANT
PACK ORTHO EXTREMITY (CUSTOM PROCEDURE TRAY) ×1 IMPLANT
PAD ARMBOARD 7.5X6 YLW CONV (MISCELLANEOUS) ×2 IMPLANT
PAD CAST 4YDX4 CTTN HI CHSV (CAST SUPPLIES) ×1 IMPLANT
PADDING CAST COTTON 4X4 STRL (CAST SUPPLIES)
PUTTY DBM STAGRAFT PLUS 5CC (Putty) IMPLANT
SCREW CANN HDLS SHRT 6.5X75 (Screw) IMPLANT
SCREW CANN HDLS SHT 6.5X80 (Screw) IMPLANT
SPONGE T-LAP 18X18 ~~LOC~~+RFID (SPONGE) ×1 IMPLANT
SUCTION TUBE FRAZIER 10FR DISP (MISCELLANEOUS) ×1 IMPLANT
SUT ETHILON 2 0 PSLX (SUTURE) ×3 IMPLANT
SYR CONTROL 10ML LL (SYRINGE) IMPLANT
TOWEL GREEN STERILE (TOWEL DISPOSABLE) ×1 IMPLANT
TOWEL GREEN STERILE FF (TOWEL DISPOSABLE) ×1 IMPLANT
TUBE CONNECTING 12X1/4 (SUCTIONS) ×1 IMPLANT
WATER STERILE IRR 1000ML POUR (IV SOLUTION) ×1 IMPLANT
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
## 2022-08-08 NOTE — Anesthesia Procedure Notes (Addendum)
Anesthesia Regional Block: Popliteal block   Pre-Anesthetic Checklist: , timeout performed,  Correct Patient, Correct Site, Correct Laterality,  Correct Procedure, Correct Position, site marked,  Risks and benefits discussed,  Surgical consent,  Pre-op evaluation,  At surgeon's request and post-op pain management  Laterality: Lower and Left  Prep: chloraprep       Needles:  Injection technique: Single-shot      Needle Length: 9cm  Needle Gauge: 22     Additional Needles: Arrow StimuQuik ECHO Echogenic Stimulating PNB Needle  Procedures:,,,, ultrasound used (permanent image in chart),,    Narrative:  Start time: 08/08/2022 9:00 AM End time: 08/08/2022 9:10 AM Injection made incrementally with aspirations every 5 mL.  Performed by: Personally  Anesthesiologist: Val Eagle, MD  Additional Notes: 5 ml 0.5% Marcaine with epi left saphenous injection under US guidance
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
## 2022-08-08 NOTE — Op Note (Signed)
08/08/2022  11:00 AM  PATIENT:  Benjamin Vaughn.    PRE-OPERATIVE DIAGNOSIS:  Left Traumatic Subtalar Arthritis  POST-OPERATIVE DIAGNOSIS:  Same  PROCEDURE:  LEFT SUBTALAR FUSION Application of stay graft 5 cc. C-arm possibly to verify reduction.  SURGEON:  Nadara Mustard, MD  PHYSICIAN ASSISTANT: Shela Nevin ANESTHESIA:   General  PREOPERATIVE INDICATIONS:  Benjamin Vaughn. is a  52 y.o. male with a diagnosis of Left Traumatic Subtalar Arthritis who failed conservative measures and elected for surgical management.    The risks benefits and alternatives were discussed with the patient preoperatively including but not limited to the risks of infection, bleeding, nerve injury, cardiopulmonary complications, the need for revision surgery, among others, and the patient was willing to proceed.  OPERATIVE IMPLANTS:   Implant Name Type Inv. Item Serial No. Manufacturer Lot No. LRB No. Used Action  PUTTY DBM STAGRAFT PLUS 5CC - [id number] Putty PUTTY DBM STAGRAFT PLUS 5CC  [employer](ORTH,TRAU,BIO,SG) [id number] Left 1 Implanted  SCREW CANN HDLS SHRT 6.5X75 - [id number] Screw SCREW CANN HDLS SHRT 6.5X75  [employer](ORTH,TRAU,BIO,SG)  Left 1 Implanted  SCREW CANN HDLS SHT 6.5X80 - [id number] Screw SCREW CANN HDLS SHT 6.5X80  [employer](ORTH,TRAU,BIO,SG)  Left 1 Implanted    @ENCIMAGES @  OPERATIVE FINDINGS: C-arm possibly verified reduction of the subtalar joint.  OPERATIVE PROCEDURE: Patient was brought to the operating room after undergoing a popliteal block.  After adequate levels anesthesia were obtained patient's left lower extremity was prepped using DuraPrep draped into a sterile field a timeout was called.  An oblique incision was made over the sinus Tarsi.  This was carried down to the subtalar joint.  Subperiosteal dissection was used to protect the peroneal tendons as well as the dorsal structures.  Patient extensive bony prominence laterally and this was resected with a  Rampey bur.  There was to collapse and deformity of the subtalar joint and this was further debrided with the bur back to bleeding viable subchondral bone.  The curette was used to finalize the debridement.  The wound was irrigated with normal saline and filled with 5 cc of stay graft putty.  2 K wires were inserted from the calcaneus into the talus.  C-arm Shrosbree verified alignment and 2 screws were inserted to stabilize the subtalar joint.  C-arm Shrosbree verified reduction.  The wound was further irrigated and incision closed using 2-0 nylon.  Sterile dressing was applied patient was taken the PACU in stable condition.   DISCHARGE PLANNING:  Antibiotic duration: Preoperative antibiotics.  Weightbearing: Touchdown weightbearing on the left  Pain medication: Prescription for oxycodone.  Dressing care/ Wound VAC: Dry dressing  Ambulatory devices: Crutches  Discharge to: Home.  Follow-up: In the office 1 week post operative.

## 2022-08-08 NOTE — Transfer of Care (Signed)
Immediate Anesthesia Transfer of Care Note  Patient: Benjamin Vaughn.  Procedure(s) Performed: LEFT SUBTALAR FUSION (Left: Ankle)  Patient Location: PACU  Anesthesia Type:MAC  Level of Consciousness: sedated  Airway & Oxygen Therapy: Patient Spontanous Breathing  Post-op Assessment: Report given to RN and Post -op Vital signs reviewed and stable  Post vital signs: Reviewed and stable  Last Vitals:  Vitals Value Taken Time  BP 93/60 08/08/22 1110  Temp 36.4 C 08/08/22 1110  Pulse 57 08/08/22 1110  Resp 15 08/08/22 1110  SpO2 98 % 08/08/22 1110    Last Pain:  Vitals:   08/08/22 0923  TempSrc:   PainSc: 6          Complications: No notable events documented.

## 2022-08-09 ENCOUNTER — Encounter (HOSPITAL_COMMUNITY): Payer: Self-pay | Admitting: Orthopedic Surgery

## 2022-08-09 NOTE — Anesthesia Postprocedure Evaluation (Signed)
Anesthesia Post Note  Patient: Abad Edge.  Procedure(s) Performed: LEFT SUBTALAR FUSION (Left: Ankle)     Patient location during evaluation: PACU Anesthesia Type: Regional and MAC Level of consciousness: awake and alert Pain management: pain level controlled Vital Signs Assessment: post-procedure vital signs reviewed and stable Respiratory status: spontaneous breathing, nonlabored ventilation and respiratory function stable Cardiovascular status: blood pressure returned to baseline and stable Postop Assessment: no apparent nausea or vomiting Anesthetic complications: no   No notable events documented.  Last Vitals:  Vitals:   08/08/22 1215 08/08/22 1230  BP: (!) 155/93 131/85  Pulse: 69 60  Resp: 15   Temp:  36.5 C  SpO2: 100% 100%    Last Pain:  Vitals:   08/08/22 0923  TempSrc:   PainSc: 6                  Nadyne Gariepy

## 2022-08-15 ENCOUNTER — Ambulatory Visit (INDEPENDENT_AMBULATORY_CARE_PROVIDER_SITE_OTHER): Payer: Disability Insurance | Admitting: Family

## 2022-08-15 ENCOUNTER — Encounter: Payer: Self-pay | Admitting: Family

## 2022-08-15 DIAGNOSIS — S92015D Nondisplaced fracture of body of left calcaneus, subsequent encounter for fracture with routine healing: Secondary | ICD-10-CM

## 2022-08-15 DIAGNOSIS — M79672 Pain in left foot: Secondary | ICD-10-CM

## 2022-08-15 DIAGNOSIS — G8929 Other chronic pain: Secondary | ICD-10-CM
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
## 2022-08-16 ENCOUNTER — Telehealth: Payer: Self-pay | Admitting: Orthopedic Surgery

## 2022-08-16 NOTE — Telephone Encounter (Signed)
Pt called in requesting Oxycodone refill please advise

## 2022-08-16 NOTE — Telephone Encounter (Signed)
Pt is s/p a left subtalar fusion 08/08/2022 rx Oxycodone 5 mg given that day #30 pt is asking for refill please advise.

## 2022-08-17 MED ORDER — OXYCODONE HCL 5 MG PO TABS: 5.0000 mg | ORAL_TABLET | ORAL | 0 refills | Status: AC | PRN

## 2022-08-17 NOTE — Addendum Note (Signed)
Addended by: Adonis Huguenin on: 08/17/2022 08:27 AM   Modules accepted: Orders

## 2022-08-23 ENCOUNTER — Ambulatory Visit (INDEPENDENT_AMBULATORY_CARE_PROVIDER_SITE_OTHER): Payer: Medicaid Other | Admitting: Orthopedic Surgery

## 2022-08-23 ENCOUNTER — Other Ambulatory Visit (INDEPENDENT_AMBULATORY_CARE_PROVIDER_SITE_OTHER): Payer: Medicaid Other

## 2022-08-23 DIAGNOSIS — S92015D Nondisplaced fracture of body of left calcaneus, subsequent encounter for fracture with routine healing: Secondary | ICD-10-CM | POA: Diagnosis not present

## 2022-09-10 ENCOUNTER — Encounter: Payer: Self-pay | Admitting: Orthopedic Surgery
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
## 2022-09-13 ENCOUNTER — Other Ambulatory Visit (INDEPENDENT_AMBULATORY_CARE_PROVIDER_SITE_OTHER): Payer: Medicaid Other

## 2022-09-13 ENCOUNTER — Ambulatory Visit: Payer: Medicaid Other | Admitting: Orthopedic Surgery

## 2022-09-13 DIAGNOSIS — S92015D Nondisplaced fracture of body of left calcaneus, subsequent encounter for fracture with routine healing: Secondary | ICD-10-CM

## 2022-09-21 ENCOUNTER — Encounter: Payer: Self-pay | Admitting: Orthopedic Surgery
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
## 2022-10-11 ENCOUNTER — Encounter: Payer: Disability Insurance | Admitting: Orthopedic Surgery

## 2022-11-06 ENCOUNTER — Encounter (HOSPITAL_COMMUNITY): Payer: Self-pay | Admitting: *Deleted

## 2022-11-06 ENCOUNTER — Emergency Department (HOSPITAL_COMMUNITY): Payer: Medicaid Other

## 2022-11-06 ENCOUNTER — Emergency Department (HOSPITAL_COMMUNITY)
Admission: EM | Admit: 2022-11-06 | Discharge: 2022-11-06 | Disposition: A | Discharge status: Home / Self Care | Payer: Medicaid Other | Source: Home / Self Care | Attending: Emergency Medicine | Admitting: Emergency Medicine

## 2022-11-06 ENCOUNTER — Other Ambulatory Visit: Payer: Self-pay

## 2022-11-06 DIAGNOSIS — R531 Weakness: Secondary | ICD-10-CM | POA: Diagnosis present

## 2022-11-06 DIAGNOSIS — Y900 Blood alcohol level of less than 20 mg/100 ml: Secondary | ICD-10-CM | POA: Insufficient documentation

## 2022-11-06 DIAGNOSIS — F1510 Other stimulant abuse, uncomplicated: Secondary | ICD-10-CM | POA: Diagnosis not present

## 2022-11-06 DIAGNOSIS — R4182 Altered mental status, unspecified: Secondary | ICD-10-CM | POA: Diagnosis not present

## 2022-11-06 LAB — URINALYSIS, ROUTINE W REFLEX MICROSCOPIC
Bilirubin Urine: NEGATIVE
Glucose, UA: NEGATIVE mg/dL
Hgb urine dipstick: NEGATIVE
Ketones, ur: NEGATIVE mg/dL
Leukocytes,Ua: NEGATIVE
Nitrite: NEGATIVE
Protein, ur: NEGATIVE mg/dL
Specific Gravity, Urine: 1.004 — ABNORMAL LOW (ref 1.005–1.030)
pH: 7 (ref 5.0–8.0)

## 2022-11-06 LAB — COMPREHENSIVE METABOLIC PANEL
ALT: 17 U/L (ref 0–44)
AST: 22 U/L (ref 15–41)
Albumin: 4.4 g/dL (ref 3.5–5.0)
Alkaline Phosphatase: 74 U/L (ref 38–126)
Anion gap: 14 (ref 5–15)
BUN: 16 mg/dL (ref 6–20)
CO2: 25 mmol/L (ref 22–32)
Calcium: 9.1 mg/dL (ref 8.9–10.3)
Chloride: 95 mmol/L — ABNORMAL LOW (ref 98–111)
Creatinine, Ser: 0.91 mg/dL (ref 0.61–1.24)
GFR, Estimated: >60 mL/min (ref >60)
Glucose, Bld: 121 mg/dL — ABNORMAL HIGH (ref 70–99)
Potassium: 3.4 mmol/L — ABNORMAL LOW (ref 3.5–5.1)
Sodium: 134 mmol/L — ABNORMAL LOW (ref 135–145)
Total Bilirubin: 1.2 mg/dL (ref 0.3–1.2)
Total Protein: 8.7 g/dL — ABNORMAL HIGH (ref 6.5–8.1)

## 2022-11-06 LAB — CBC
HCT: 44.9 % (ref 39.0–52.0)
Hemoglobin: 14.9 g/dL (ref 13.0–17.0)
MCH: 28.3 pg (ref 26.0–34.0)
MCHC: 33.2 g/dL (ref 30.0–36.0)
MCV: 85.4 fL (ref 80.0–100.0)
Platelets: 510 10*3/uL — ABNORMAL HIGH (ref 150–400)
RBC: 5.26 MIL/uL (ref 4.22–5.81)
RDW: 13.3 % (ref 11.5–15.5)
WBC: 10.3 10*3/uL (ref 4.0–10.5)
nRBC: 0 % (ref 0.0–0.2)

## 2022-11-06 LAB — DIFFERENTIAL
Abs Immature Granulocytes: 0.04 10*3/uL (ref 0.00–0.07)
Basophils Absolute: 0 10*3/uL (ref 0.0–0.1)
Basophils Relative: 0 %
Eosinophils Absolute: 0 10*3/uL (ref 0.0–0.5)
Eosinophils Relative: 0 %
Immature Granulocytes: 0 %
Lymphocytes Relative: 19 %
Lymphs Abs: 2 10*3/uL (ref 0.7–4.0)
Monocytes Absolute: 0.8 10*3/uL (ref 0.1–1.0)
Monocytes Relative: 7 %
Neutro Abs: 7.5 10*3/uL (ref 1.7–7.7)
Neutrophils Relative %: 74 %

## 2022-11-06 LAB — PROTIME-INR
INR: 1 (ref 0.8–1.2)
Prothrombin Time: 13.8 s (ref 11.4–15.2)

## 2022-11-06 LAB — RAPID URINE DRUG SCREEN, HOSP PERFORMED
Amphetamines: POSITIVE — AB
Barbiturates: NOT DETECTED
Benzodiazepines: NOT DETECTED
Cocaine: NOT DETECTED
Opiates: NOT DETECTED
Tetrahydrocannabinol: NOT DETECTED

## 2022-11-06 LAB — CBG MONITORING, ED: Glucose-Capillary: 162 mg/dL — ABNORMAL HIGH (ref 70–99)

## 2022-11-06 LAB — ETHANOL: Alcohol, Ethyl (B): <10 mg/dL (ref <10)

## 2022-11-06 LAB — APTT: aPTT: 27 s (ref 24–36)

## 2022-11-06 MED ORDER — IOHEXOL 350 MG/ML SOLN
75.0000 mL | Freq: Once | INTRAVENOUS | Status: AC | PRN
  Administered 2022-11-06: 75 mL via INTRAVENOUS

## 2022-11-06 MED ORDER — LORAZEPAM 2 MG/ML IJ SOLN
INTRAMUSCULAR | Status: AC
  Administered 2022-11-06: 1 mg via INTRAVENOUS
  Filled 2022-11-06: qty 1

## 2022-11-06 MED ORDER — LORAZEPAM 2 MG/ML IJ SOLN: 1.0000 mg | Freq: Once | INTRAMUSCULAR | Status: AC

## 2022-11-06 MED ORDER — OXYCODONE HCL 5 MG PO TABS
5.0000 mg | ORAL_TABLET | Freq: Once | ORAL | Status: AC
  Administered 2022-11-06: 5 mg via ORAL
  Filled 2022-11-06: qty 1

## 2022-11-06 MED ORDER — POTASSIUM CHLORIDE CRYS ER 20 MEQ PO TBCR
40.0000 meq | EXTENDED_RELEASE_TABLET | Freq: Once | ORAL | Status: AC
  Administered 2022-11-06: 40 meq via ORAL
  Filled 2022-11-06: qty 2

## 2022-11-06 MED ORDER — SODIUM CHLORIDE 0.9 % IV BOLUS
1000.0000 mL | Freq: Once | INTRAVENOUS | Status: AC
  Administered 2022-11-06: 1000 mL via INTRAVENOUS

## 2022-11-06 NOTE — ED Triage Notes (Signed)
Pt BIB [facility] for c/o stroke symptoms that started yesterday at 1pm   Family members state pt started with sx at 1pm yesterday with left side weakness and left side facial droop and aphasia

## 2022-11-06 NOTE — ED Provider Notes (Signed)
  Physical Exam  BP (!) 168/111   Pulse 75   Temp 98.3 F (36.8 C) (Oral)   Resp 15   Ht 5\' 11"  (1.803 m)   Wt 68 kg   SpO2 96%   BMI 20.91 kg/m   Physical Exam  Procedures  Procedures  ED Course / MDM   Clinical Course as of 11/06/22 1628  Tue Nov 06, 2022  1225 I was called to the bedside because the patient was having seizure like activity. Nurse tech noted shaking like a seizure. When I got there he was laying on his side and his head was shaking back-and-forth and all 4 extremities seem to be twitching some.  I was able to open his eyes and while he would not follow commands, the shaking I was seeing seem to be more consistent with nonepileptic seizure.  However am not sure if he had a true seizure before this and he was given a dose of Ativan.  Will continue workup and monitor. [SG]    Clinical Course User Index [SG] Pricilla Loveless, MD   Medical Decision Making Amount and/or Complexity of Data Reviewed Labs: ordered. Radiology: ordered.  Risk Prescription drug management.   I, Rosana Berger, assumed care for this patient.  In brief this is a 52 year old male who there is concern about not speaking and weakness.  I assessed the patient, he is able to speak in complete sentences, is alert and oriented x 4, interacts appropriately.  He is ambulatory without any difficulty.  I do not appreciate any weakness.  His CT imaging of the head and neck was normal.  Patient's urinalysis did contain amphetamines.  Informed the patient of his normal imaging, and his reassuring physical exam and how I believe he would be safe for discharge.  The patient then asked me if I could "hook him up with some pain pills.  " We wish him the best.  Return precautions were discussed with the patient at bedside.      Anders Simmonds T, DO 11/06/22 1630

## 2022-11-06 NOTE — ED Notes (Addendum)
Pt given water, ginger ale, peanut butter, & crackers.

## 2022-11-06 NOTE — ED Provider Notes (Signed)
[place] EMERGENCY DEPARTMENT AT [facility] Provider Note   CSN: [id number] Arrival date & time: 11/06/22  1117     History  Chief Complaint  Patient presents with   Weakness    Benjamin Vaughn. is a 52 y.o. male.  HPI 52 year old male presents with strokelike symptoms.  History is from EMS as the patient is not speaking.  Appeared to have left-sided weakness for EMS.  He was last seen well around 1 PM, but EMS clarifies this does note that that is when family noticed that he seemed to be off balance and symptomatic.  The patient is not speaking to me but will sometimes make a small noise or shake his head yes or no.  However the history is very limited from his perspective.  Home Medications Prior to Admission medications   Medication Sig Start Date End Date Taking? Authorizing Provider  albuterol (VENTOLIN HFA) 108 (90 Base) MCG/ACT inhaler Inhale 1-2 puffs into the lungs every 6 (six) hours as needed for wheezing or shortness of breath. 06/08/22  Yes Beatty, Celeste A, PA-C  amLODipine (NORVASC) 5 MG tablet Take 1 tablet (5 mg total) by mouth daily. 07/29/21  Yes Eber Hong, MD  chlorhexidine (PERIDEX) 0.12 % solution Use as directed 15 mLs in the mouth or throat 2 (two) times daily. Patient taking differently: Use as directed 15 mLs in the mouth or throat daily as needed (Sometimes). 06/08/22  Yes Beatty, Celeste A, PA-C  diphenhydrAMINE (BENADRYL) 25 MG tablet Take 25 mg by mouth every 6 (six) hours as needed for allergies.   Yes [provider]  hydrOXYzine (ATARAX) 25 MG tablet Take 1 tablet (25 mg total) by mouth every 8 (eight) hours as needed for itching or anxiety. 06/11/22  Yes Bethann Berkshire, MD  oxyCODONE (OXY IR/ROXICODONE) 5 MG immediate release tablet Take 1 tablet (5 mg total) by mouth every 4 (four) hours as needed for severe pain. 08/17/22  Yes Adonis Huguenin, NP      Allergies    Aspirin, Bactrim, Hydrocodone-acetaminophen, Nsaids,  Sulfamethoxazole, Tylenol [acetaminophen], Vicodin [hydrocodone-acetaminophen], and Sulfamethoxazole-trimethoprim    Review of Systems   Review of Systems  Unable to perform ROS: Mental status change    Physical Exam Updated Vital Signs BP (!) 148/104   Pulse 79   Temp 98.3 F (36.8 C) (Oral)   Resp 18   Ht 5\' 11"  (1.803 m)   Wt 68 kg   SpO2 100%   BMI 20.91 kg/m  Physical Exam Vitals and nursing note reviewed.  Constitutional:      Appearance: He is well-developed.  HENT:     Head: Normocephalic and atraumatic.  Eyes:     Pupils: Pupils are equal, round, and reactive to light.  Cardiovascular:     Rate and Rhythm: Normal rate and regular rhythm.     Pulses:          Radial pulses are 2+ on the left side.     Heart sounds: Normal heart sounds.  Pulmonary:     Effort: Pulmonary effort is normal.     Breath sounds: Normal breath sounds.  Abdominal:     General: There is no distension.     Palpations: Abdomen is soft.     Tenderness: There is no abdominal tenderness.  Musculoskeletal:     Cervical back: Normal range of motion and neck supple. No rigidity.  Skin:    General: Skin is warm and dry.  Neurological:  Mental Status: He is alert.     Comments: Patient is awake but is not speaking to me.  However he does seem to understand and follow commands.  He seems to have equal grip strength and arm strength/leg strength bilaterally.  However when asking to hold up his arms does seem like the left arm drifts.  He does not appear to have any neglect.     ED Results / Procedures / Treatments   Labs (all labs ordered are listed, but only abnormal results are displayed) Labs Reviewed  COMPREHENSIVE METABOLIC PANEL - Abnormal; Notable for the following components:      Result Value   Sodium 134 (*)    Potassium 3.4 (*)    Chloride 95 (*)    Glucose, Bld 121 (*)    Total Protein 8.7 (*)    All other components within normal limits  RAPID URINE DRUG SCREEN, HOSP  PERFORMED - Abnormal; Notable for the following components:   Amphetamines POSITIVE (*)    All other components within normal limits  URINALYSIS, ROUTINE W REFLEX MICROSCOPIC - Abnormal; Notable for the following components:   Color, Urine STRAW (*)    Specific Gravity, Urine 1.004 (*)    All other components within normal limits  CBC - Abnormal; Notable for the following components:   Platelets 510 (*)    All other components within normal limits  CBG MONITORING, ED - Abnormal; Notable for the following components:   Glucose-Capillary 162 (*)    All other components within normal limits  ETHANOL  PROTIME-INR  APTT  DIFFERENTIAL  I-STAT CHEM 8, ED    EKG EKG Interpretation Date/Time:  Tuesday November 06 2022 11:30:30 EDT Ventricular Rate:  98 PR Interval:  131 QRS Duration:  93 QT Interval:  361 QTC Calculation: 461 R Axis:   0  Text Interpretation: Sinus rhythm  similar to Jan 2024 Confirmed by Pricilla Loveless [phone]) on 11/06/2022 2:40:17 PM  Radiology DG Chest Portable 1 View  Result Date: 11/06/2022 CLINICAL DATA:  Altered mental status. EXAM: PORTABLE CHEST 1 VIEW COMPARISON:  Chest x-ray 06/08/2022 and older FINDINGS: Hyperinflation. No consolidation, pneumothorax or effusion. No edema. Normal cardiopericardial silhouette. Overlapping cardiac leads. IMPRESSION: Hyperinflation.  No acute cardiopulmonary disease Electronically Signed   By: Karen Kays M.D.   On: 11/06/2022 12:43    Procedures Procedures    Medications Ordered in ED Medications  LORazepam (ATIVAN) injection 1 mg (1 mg Intravenous Given 11/06/22 1219)  iohexol (OMNIPAQUE) 350 MG/ML injection 75 mL (75 mLs Intravenous Contrast Given 11/06/22 1321)  oxyCODONE (Oxy IR/ROXICODONE) immediate release tablet 5 mg (5 mg Oral Given 11/06/22 1537)  sodium chloride 0.9 % bolus 1,000 mL (1,000 mLs Intravenous Bolus 11/06/22 1538)    ED Course/ Medical Decision Making/ A&P Clinical Course as of 11/06/22 1539  Tue Nov 06, 2022  1225 I was called to the bedside because the patient was having seizure like activity. Nurse tech noted shaking like a seizure. When I got there he was laying on his side and his head was shaking back-and-forth and all 4 extremities seem to be twitching some.  I was able to open his eyes and while he would not follow commands, the shaking I was seeing seem to be more consistent with nonepileptic seizure.  However am not sure if he had a true seizure before this and he was given a dose of Ativan.  Will continue workup and monitor. [SG]    Clinical Course User Index [  SG] Pricilla Loveless, MD                                 Medical Decision Making Amount and/or Complexity of Data Reviewed Labs: ordered.    Details: Amphetamines in the urine.  Mild hypokalemia. Radiology: ordered. ECG/medicine tests: ordered.    Details: Sinus rhythm with no ischemia.  No CHF or pneumonia  Risk Prescription drug management.   Patient's presentation had an inconsistent neuroexam.  During his seizure-like episode, I was able to raise his hands above his body and then they seem to return to his sides rather than dropping straight down.  Imaging has been pursued though this is less likely to be TIA/stroke.  After the initial workup, while CT is still pending, he is now fully awake, oriented, and talking to me.  Seems to be back to normal.  He is telling about his foot pain which has been hurting since surgery over the last month or 2.  Otherwise, CTA of head and neck is pending and care will be transferred to Dr. Andria Meuse.        Final Clinical Impression(s) / ED Diagnoses Final diagnoses:  None    Rx / DC Orders ED Discharge Orders     None         Pricilla Loveless, MD 11/06/22 1540

## 2022-11-07 ENCOUNTER — Encounter: Payer: Disability Insurance | Admitting: Family

## 2023-02-07 ENCOUNTER — Ambulatory Visit: Payer: Medicaid Other | Admitting: Orthopedic Surgery

## 2023-02-28 ENCOUNTER — Telehealth: Payer: Self-pay

## 2023-02-28 NOTE — Telephone Encounter (Signed)
Patient called wanting to schedule an appointment with Dr. Romeo Apple for his R Shoulder and L Ankle. He had surgery on 08/08/22 for his L Ankle by Dr. Lajoyce Corners. I returned his call and had to leave a msg for him to call the office. I was going to discuss his right shoulder and recommend him to see Dr. Lajoyce Corners again for his left ankle, since he had surgery.

## 2023-04-29 ENCOUNTER — Other Ambulatory Visit (INDEPENDENT_AMBULATORY_CARE_PROVIDER_SITE_OTHER): Payer: Self-pay

## 2023-04-29 ENCOUNTER — Encounter: Payer: Medicaid Other | Admitting: Orthopedic Surgery

## 2023-04-29 ENCOUNTER — Ambulatory Visit: Payer: Medicaid Other | Admitting: Orthopedic Surgery

## 2023-04-29 ENCOUNTER — Encounter: Payer: Self-pay | Admitting: Orthopedic Surgery

## 2023-04-29 VITALS — BP 120/85 | HR 78 | Ht 71.0 in | Wt 155.0 lb

## 2023-04-29 DIAGNOSIS — G8929 Other chronic pain: Secondary | ICD-10-CM

## 2023-04-29 DIAGNOSIS — M25511 Pain in right shoulder: Secondary | ICD-10-CM

## 2023-04-29 DIAGNOSIS — M25552 Pain in left hip: Secondary | ICD-10-CM

## 2023-04-29 DIAGNOSIS — M5410 Radiculopathy, site unspecified: Secondary | ICD-10-CM

## 2023-04-29 DIAGNOSIS — M545 Low back pain, unspecified: Secondary | ICD-10-CM | POA: Diagnosis not present

## 2023-04-29 NOTE — Progress Notes (Signed)
 Subjective:    Patient ID: Benjamin Abrahams., male    DOB: Jun 11, 1970, 53 y.o.   MRN: [id number]  Benjamin Vaughn comes in for evaluation of his right shoulder.  He also after the initial questioning wants his left hip and left foot evaluated  He says that he went for disability evaluation and the person told him he needed to get his shoulder checked out.  He says he was having some mild symptoms at the time which have worsened since the disability evaluation.  He complains of pain over the top and lateral side of the right shoulder with associated range of motion difficulty and weakness  He had internal fixation of his left hip in [place] and says that he is having some lateral hip pain near the iliac crest  He also had foot surgery with Dr. Lajoyce Corners and says his foot is hurting as well  Shoulder Pain       Review of Systems  Musculoskeletal:        Joint pain       Objective:   Physical Exam Vitals and nursing note reviewed.  Constitutional:      Appearance: Normal appearance.  HENT:     Head: Normocephalic and atraumatic.  Eyes:     General: No scleral icterus.       Right eye: No discharge.        Left eye: No discharge.     Extraocular Movements: Extraocular movements intact.     Conjunctiva/sclera: Conjunctivae normal.     Pupils: Pupils are equal, round, and reactive to light.  Cardiovascular:     Rate and Rhythm: Normal rate.     Pulses: Normal pulses.  Skin:    General: Skin is warm and dry.     Capillary Refill: Capillary refill takes less than 2 seconds.  Neurological:     General: No focal deficit present.     Mental Status: He is alert and oriented to person, place, and time.  Psychiatric:        Mood and Affect: Mood normal.        Behavior: Behavior normal.        Thought Content: Thought content normal.        Judgment: Judgment normal.    Right shoulder.  Tenderness is noted over the anterior joint line.  No neck pain or tenderness trapezius  nontender.  He does have a history of cervical disc disease please see imaging.  Internal/external rotation of the arm with his arm at his side are normal.  He has mild deficits in flexion passive range of motion and this also involves mild deficit in abduction with passive range of motion.  He complains of pain throughout the range of motion in both abduction and flexion.  Cuff strength is normal with evidence of giving way weakness  Left hip exam: Normal range of motion of the left hip normal leg lengths.  No rotatory abnormalities.  Pain and tenderness is in his lower back       Assessment & Plan:   Encounter Diagnoses  Name Primary?   Chronic right shoulder pain Yes   Chronic left hip pain    Lumbar pain    Radicular pain of left lower extremity     Right shoulder pain has some evidence on x-ray of some mild rotator cuff insufficiency.  Recommend physical therapy  Chronic left hip pain is actually coming from his back recommend physical therapy   Comparison from postop  films done in [place] DG HIP UNILAT WITH PELVIS 2-3 VIEWS LEFT Result Date: 04/29/2023 Hip left with pelvis Previous ORIF with long InterTAN femoral nail X-ray shows no complications related to the nail.  Humeral head shows no AVN Fracture appears healed Impression normal appearance status post ORIF with long InterTAN femoral nail   DG Shoulder Right Result Date: 04/29/2023 X-ray right shoulder Right shoulder pain Chronic pain No injury X-ray shows mild humeral head height versus acromion shortening mild asymmetry medial humeral line with glenoid No arthritis Impression rotator cuff insufficiency possibly based on x-ray changes no glenohumeral arthritis     No surgical indications at this time recommend follow-up with as needed

## 2023-04-29 NOTE — Progress Notes (Signed)
  Intake history:  BP 120/85   Pulse 78   Ht 5\' 11"  (1.803 m)   Wt 155 lb (70.3 kg)   BMI 21.62 kg/m  Body mass index is 21.62 kg/m.    WHAT ARE WE SEEING YOU FOR TODAY?   right shoulder/ states left also painful   How long has this bothered you? (DOI?DOS?WS?)  on long duration few months   Anticoag.  No  Diabetes No  Heart disease No  Hypertension Yes  SMOKING HX No  Kidney disease No  Any ALLERGIES _________ Allergies  Allergen Reactions   Aspirin Other (See Comments)    Bad for kidneys    Bactrim Hives and Itching   Hydrocodone-Acetaminophen Itching   Nsaids Other (See Comments)    Bad for kidneys   Sulfamethoxazole Itching   Tylenol [Acetaminophen] Other (See Comments)    Bad for kidneys    Vicodin [Hydrocodone-Acetaminophen] Itching   Sulfamethoxazole-Trimethoprim Itching   _____________________________________   Treatment:  Have you taken:  Tylenol No  Advil No  Had PT No  Had injection No  Other  _______________took Oxycodone __________

## 2023-04-29 NOTE — Patient Instructions (Addendum)
 You should get a call about therapy from [facility] If you do not get a call in a couple days call them Number is [phone]

## 2023-04-30 ENCOUNTER — Emergency Department (HOSPITAL_COMMUNITY)
Admission: EM | Admit: 2023-04-30 | Discharge: 2023-04-30 | Discharge status: Against Medical Advice | Payer: Medicaid Other

## 2023-04-30 NOTE — ED Triage Notes (Signed)
 Called, no answer.
